# Patient Record
Sex: Female | Born: 1940 | Race: White | Hispanic: No | Marital: Married | Smoking: Never smoker
Health system: Southern US, Community
[De-identification: ages and names within clinical notes are randomized; demographics above are authoritative.]

## PROBLEM LIST (undated history)

## (undated) DIAGNOSIS — F411 Generalized anxiety disorder: Secondary | ICD-10-CM

## (undated) DIAGNOSIS — E039 Hypothyroidism, unspecified: Secondary | ICD-10-CM

## (undated) DIAGNOSIS — E559 Vitamin D deficiency, unspecified: Secondary | ICD-10-CM

## (undated) DIAGNOSIS — F419 Anxiety disorder, unspecified: Secondary | ICD-10-CM

## (undated) DIAGNOSIS — I129 Hypertensive chronic kidney disease with stage 1 through stage 4 chronic kidney disease, or unspecified chronic kidney disease: Secondary | ICD-10-CM

## (undated) DIAGNOSIS — I471 Supraventricular tachycardia, unspecified: Secondary | ICD-10-CM

## (undated) DIAGNOSIS — M51369 Other intervertebral disc degeneration, lumbar region without mention of lumbar back pain or lower extremity pain: Secondary | ICD-10-CM

## (undated) DIAGNOSIS — Z8679 Personal history of other diseases of the circulatory system: Secondary | ICD-10-CM

## (undated) DIAGNOSIS — Z9889 Other specified postprocedural states: Secondary | ICD-10-CM

## (undated) DIAGNOSIS — F329 Major depressive disorder, single episode, unspecified: Secondary | ICD-10-CM

## (undated) DIAGNOSIS — M5136 Other intervertebral disc degeneration, lumbar region: Secondary | ICD-10-CM

## (undated) DIAGNOSIS — F32A Depression, unspecified: Secondary | ICD-10-CM

## (undated) DIAGNOSIS — R5383 Other fatigue: Secondary | ICD-10-CM

## (undated) DIAGNOSIS — R0602 Shortness of breath: Secondary | ICD-10-CM

## (undated) HISTORY — DX: Generalized anxiety disorder: F41.1

## (undated) HISTORY — DX: Hypertensive chronic kidney disease with stage 1 through stage 4 chronic kidney disease, or unspecified chronic kidney disease: I12.9

## (undated) HISTORY — DX: Supraventricular tachycardia, unspecified: I47.10

## (undated) HISTORY — DX: Personal history of other diseases of the circulatory system: Z86.79

## (undated) HISTORY — DX: Anxiety disorder, unspecified: F41.9

## (undated) HISTORY — DX: Vitamin D deficiency, unspecified: E55.9

## (undated) HISTORY — DX: Depression, unspecified: F32.A

## (undated) HISTORY — DX: Other intervertebral disc degeneration, lumbar region without mention of lumbar back pain or lower extremity pain: M51.369

## (undated) HISTORY — DX: Other specified postprocedural states: Z98.890

## (undated) HISTORY — DX: Other fatigue: R53.83

## (undated) HISTORY — DX: Hypothyroidism, unspecified: E03.9

## (undated) HISTORY — DX: Shortness of breath: R06.02

## (undated) HISTORY — DX: Other intervertebral disc degeneration, lumbar region: M51.36

---
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
## 1972-07-16 HISTORY — PX: ABDOMINAL HYSTERECTOMY: SHX81

## 2015-07-19 DIAGNOSIS — H524 Presbyopia: Secondary | ICD-10-CM | POA: Insufficient documentation

## 2015-07-19 DIAGNOSIS — F339 Major depressive disorder, recurrent, unspecified: Secondary | ICD-10-CM | POA: Insufficient documentation

## 2015-07-19 DIAGNOSIS — L719 Rosacea, unspecified: Secondary | ICD-10-CM | POA: Insufficient documentation

## 2015-07-19 DIAGNOSIS — E039 Hypothyroidism, unspecified: Secondary | ICD-10-CM | POA: Insufficient documentation

## 2015-07-19 DIAGNOSIS — F5105 Insomnia due to other mental disorder: Secondary | ICD-10-CM | POA: Insufficient documentation

## 2015-07-19 DIAGNOSIS — H2513 Age-related nuclear cataract, bilateral: Secondary | ICD-10-CM | POA: Insufficient documentation

## 2015-07-19 DIAGNOSIS — F418 Other specified anxiety disorders: Secondary | ICD-10-CM | POA: Insufficient documentation

## 2015-10-11 DIAGNOSIS — F439 Reaction to severe stress, unspecified: Secondary | ICD-10-CM | POA: Insufficient documentation

## 2015-10-11 DIAGNOSIS — F339 Major depressive disorder, recurrent, unspecified: Secondary | ICD-10-CM | POA: Insufficient documentation

## 2016-07-16 HISTORY — PX: MITRAL VALVE REPAIR: SHX2039

## 2016-08-09 DIAGNOSIS — H2513 Age-related nuclear cataract, bilateral: Secondary | ICD-10-CM | POA: Diagnosis not present

## 2016-08-09 DIAGNOSIS — H40013 Open angle with borderline findings, low risk, bilateral: Secondary | ICD-10-CM | POA: Diagnosis not present

## 2016-08-28 DIAGNOSIS — R1012 Left upper quadrant pain: Secondary | ICD-10-CM | POA: Diagnosis not present

## 2016-09-12 DIAGNOSIS — R69 Illness, unspecified: Secondary | ICD-10-CM | POA: Diagnosis not present

## 2016-09-18 DIAGNOSIS — E039 Hypothyroidism, unspecified: Secondary | ICD-10-CM | POA: Diagnosis not present

## 2016-09-18 DIAGNOSIS — I10 Essential (primary) hypertension: Secondary | ICD-10-CM | POA: Diagnosis not present

## 2016-09-20 DIAGNOSIS — R1012 Left upper quadrant pain: Secondary | ICD-10-CM | POA: Diagnosis not present

## 2016-09-20 DIAGNOSIS — R109 Unspecified abdominal pain: Secondary | ICD-10-CM | POA: Diagnosis not present

## 2016-09-20 DIAGNOSIS — R1084 Generalized abdominal pain: Secondary | ICD-10-CM | POA: Diagnosis not present

## 2016-10-04 DIAGNOSIS — Z1231 Encounter for screening mammogram for malignant neoplasm of breast: Secondary | ICD-10-CM | POA: Diagnosis not present

## 2016-10-23 DIAGNOSIS — K267 Chronic duodenal ulcer without hemorrhage or perforation: Secondary | ICD-10-CM | POA: Diagnosis not present

## 2016-10-23 DIAGNOSIS — K269 Duodenal ulcer, unspecified as acute or chronic, without hemorrhage or perforation: Secondary | ICD-10-CM | POA: Diagnosis not present

## 2016-10-23 DIAGNOSIS — K2970 Gastritis, unspecified, without bleeding: Secondary | ICD-10-CM | POA: Diagnosis not present

## 2016-10-23 DIAGNOSIS — K2950 Unspecified chronic gastritis without bleeding: Secondary | ICD-10-CM | POA: Diagnosis not present

## 2016-10-23 DIAGNOSIS — R1012 Left upper quadrant pain: Secondary | ICD-10-CM | POA: Diagnosis not present

## 2016-10-25 DIAGNOSIS — Z0000 Encounter for general adult medical examination without abnormal findings: Secondary | ICD-10-CM | POA: Diagnosis not present

## 2016-10-25 DIAGNOSIS — G43801 Other migraine, not intractable, with status migrainosus: Secondary | ICD-10-CM | POA: Diagnosis not present

## 2016-10-25 DIAGNOSIS — K219 Gastro-esophageal reflux disease without esophagitis: Secondary | ICD-10-CM | POA: Diagnosis not present

## 2016-10-25 DIAGNOSIS — G43109 Migraine with aura, not intractable, without status migrainosus: Secondary | ICD-10-CM | POA: Insufficient documentation

## 2016-10-25 DIAGNOSIS — G4700 Insomnia, unspecified: Secondary | ICD-10-CM | POA: Diagnosis not present

## 2016-10-25 DIAGNOSIS — R319 Hematuria, unspecified: Secondary | ICD-10-CM | POA: Diagnosis not present

## 2016-10-25 DIAGNOSIS — R7301 Impaired fasting glucose: Secondary | ICD-10-CM | POA: Diagnosis not present

## 2016-10-25 DIAGNOSIS — I10 Essential (primary) hypertension: Secondary | ICD-10-CM | POA: Diagnosis not present

## 2016-10-25 DIAGNOSIS — R69 Illness, unspecified: Secondary | ICD-10-CM | POA: Diagnosis not present

## 2016-10-25 DIAGNOSIS — K269 Duodenal ulcer, unspecified as acute or chronic, without hemorrhage or perforation: Secondary | ICD-10-CM | POA: Diagnosis not present

## 2016-10-25 DIAGNOSIS — E039 Hypothyroidism, unspecified: Secondary | ICD-10-CM | POA: Diagnosis not present

## 2016-11-07 DIAGNOSIS — R3121 Asymptomatic microscopic hematuria: Secondary | ICD-10-CM | POA: Insufficient documentation

## 2016-11-07 DIAGNOSIS — R319 Hematuria, unspecified: Secondary | ICD-10-CM | POA: Diagnosis not present

## 2016-12-14 HISTORY — PX: TRANSTHORACIC ECHOCARDIOGRAM: SHX275

## 2016-12-20 DIAGNOSIS — E039 Hypothyroidism, unspecified: Secondary | ICD-10-CM | POA: Diagnosis not present

## 2016-12-25 DIAGNOSIS — G43801 Other migraine, not intractable, with status migrainosus: Secondary | ICD-10-CM | POA: Diagnosis not present

## 2016-12-25 DIAGNOSIS — E039 Hypothyroidism, unspecified: Secondary | ICD-10-CM | POA: Diagnosis not present

## 2016-12-25 DIAGNOSIS — H9201 Otalgia, right ear: Secondary | ICD-10-CM | POA: Diagnosis not present

## 2016-12-25 DIAGNOSIS — R011 Cardiac murmur, unspecified: Secondary | ICD-10-CM | POA: Diagnosis not present

## 2017-01-04 DIAGNOSIS — R011 Cardiac murmur, unspecified: Secondary | ICD-10-CM | POA: Diagnosis not present

## 2017-01-11 DIAGNOSIS — M546 Pain in thoracic spine: Secondary | ICD-10-CM | POA: Diagnosis not present

## 2017-01-11 DIAGNOSIS — M9901 Segmental and somatic dysfunction of cervical region: Secondary | ICD-10-CM | POA: Diagnosis not present

## 2017-01-11 DIAGNOSIS — G44209 Tension-type headache, unspecified, not intractable: Secondary | ICD-10-CM | POA: Diagnosis not present

## 2017-01-11 DIAGNOSIS — M542 Cervicalgia: Secondary | ICD-10-CM | POA: Diagnosis not present

## 2017-01-14 DIAGNOSIS — M546 Pain in thoracic spine: Secondary | ICD-10-CM | POA: Diagnosis not present

## 2017-01-14 DIAGNOSIS — M9901 Segmental and somatic dysfunction of cervical region: Secondary | ICD-10-CM | POA: Diagnosis not present

## 2017-01-14 DIAGNOSIS — M542 Cervicalgia: Secondary | ICD-10-CM | POA: Diagnosis not present

## 2017-01-14 DIAGNOSIS — G44209 Tension-type headache, unspecified, not intractable: Secondary | ICD-10-CM | POA: Diagnosis not present

## 2017-01-22 DIAGNOSIS — G44209 Tension-type headache, unspecified, not intractable: Secondary | ICD-10-CM | POA: Diagnosis not present

## 2017-01-22 DIAGNOSIS — M546 Pain in thoracic spine: Secondary | ICD-10-CM | POA: Diagnosis not present

## 2017-01-22 DIAGNOSIS — M9901 Segmental and somatic dysfunction of cervical region: Secondary | ICD-10-CM | POA: Diagnosis not present

## 2017-01-22 DIAGNOSIS — M542 Cervicalgia: Secondary | ICD-10-CM | POA: Diagnosis not present

## 2017-01-28 DIAGNOSIS — G44209 Tension-type headache, unspecified, not intractable: Secondary | ICD-10-CM | POA: Diagnosis not present

## 2017-01-28 DIAGNOSIS — M542 Cervicalgia: Secondary | ICD-10-CM | POA: Diagnosis not present

## 2017-01-28 DIAGNOSIS — M9901 Segmental and somatic dysfunction of cervical region: Secondary | ICD-10-CM | POA: Diagnosis not present

## 2017-01-28 DIAGNOSIS — M546 Pain in thoracic spine: Secondary | ICD-10-CM | POA: Diagnosis not present

## 2017-01-30 DIAGNOSIS — Z79899 Other long term (current) drug therapy: Secondary | ICD-10-CM | POA: Diagnosis not present

## 2017-01-30 DIAGNOSIS — I340 Nonrheumatic mitral (valve) insufficiency: Secondary | ICD-10-CM | POA: Diagnosis not present

## 2017-01-30 DIAGNOSIS — G43819 Other migraine, intractable, without status migrainosus: Secondary | ICD-10-CM | POA: Diagnosis not present

## 2017-01-30 DIAGNOSIS — E039 Hypothyroidism, unspecified: Secondary | ICD-10-CM | POA: Diagnosis not present

## 2017-01-30 DIAGNOSIS — R030 Elevated blood-pressure reading, without diagnosis of hypertension: Secondary | ICD-10-CM | POA: Diagnosis not present

## 2017-01-30 DIAGNOSIS — R5383 Other fatigue: Secondary | ICD-10-CM | POA: Diagnosis not present

## 2017-01-31 DIAGNOSIS — R0602 Shortness of breath: Secondary | ICD-10-CM | POA: Diagnosis not present

## 2017-01-31 DIAGNOSIS — M9901 Segmental and somatic dysfunction of cervical region: Secondary | ICD-10-CM | POA: Diagnosis not present

## 2017-01-31 DIAGNOSIS — G44209 Tension-type headache, unspecified, not intractable: Secondary | ICD-10-CM | POA: Diagnosis not present

## 2017-01-31 DIAGNOSIS — M542 Cervicalgia: Secondary | ICD-10-CM | POA: Diagnosis not present

## 2017-01-31 DIAGNOSIS — R5383 Other fatigue: Secondary | ICD-10-CM | POA: Diagnosis not present

## 2017-01-31 DIAGNOSIS — R001 Bradycardia, unspecified: Secondary | ICD-10-CM | POA: Diagnosis not present

## 2017-01-31 DIAGNOSIS — M546 Pain in thoracic spine: Secondary | ICD-10-CM | POA: Diagnosis not present

## 2017-02-04 DIAGNOSIS — M9901 Segmental and somatic dysfunction of cervical region: Secondary | ICD-10-CM | POA: Diagnosis not present

## 2017-02-04 DIAGNOSIS — G44209 Tension-type headache, unspecified, not intractable: Secondary | ICD-10-CM | POA: Diagnosis not present

## 2017-02-04 DIAGNOSIS — M546 Pain in thoracic spine: Secondary | ICD-10-CM | POA: Diagnosis not present

## 2017-02-04 DIAGNOSIS — M542 Cervicalgia: Secondary | ICD-10-CM | POA: Diagnosis not present

## 2017-02-07 DIAGNOSIS — H35431 Paving stone degeneration of retina, right eye: Secondary | ICD-10-CM | POA: Diagnosis not present

## 2017-02-07 DIAGNOSIS — H40013 Open angle with borderline findings, low risk, bilateral: Secondary | ICD-10-CM | POA: Diagnosis not present

## 2017-02-07 DIAGNOSIS — H2513 Age-related nuclear cataract, bilateral: Secondary | ICD-10-CM | POA: Diagnosis not present

## 2017-02-07 DIAGNOSIS — H43813 Vitreous degeneration, bilateral: Secondary | ICD-10-CM | POA: Diagnosis not present

## 2017-02-07 DIAGNOSIS — H5203 Hypermetropia, bilateral: Secondary | ICD-10-CM | POA: Diagnosis not present

## 2017-02-07 DIAGNOSIS — M546 Pain in thoracic spine: Secondary | ICD-10-CM | POA: Diagnosis not present

## 2017-02-07 DIAGNOSIS — M9901 Segmental and somatic dysfunction of cervical region: Secondary | ICD-10-CM | POA: Diagnosis not present

## 2017-02-07 DIAGNOSIS — H40003 Preglaucoma, unspecified, bilateral: Secondary | ICD-10-CM | POA: Diagnosis not present

## 2017-02-07 DIAGNOSIS — H524 Presbyopia: Secondary | ICD-10-CM | POA: Diagnosis not present

## 2017-02-07 DIAGNOSIS — M542 Cervicalgia: Secondary | ICD-10-CM | POA: Diagnosis not present

## 2017-02-07 DIAGNOSIS — G44209 Tension-type headache, unspecified, not intractable: Secondary | ICD-10-CM | POA: Diagnosis not present

## 2017-02-11 DIAGNOSIS — M9901 Segmental and somatic dysfunction of cervical region: Secondary | ICD-10-CM | POA: Diagnosis not present

## 2017-02-11 DIAGNOSIS — M546 Pain in thoracic spine: Secondary | ICD-10-CM | POA: Diagnosis not present

## 2017-02-11 DIAGNOSIS — M542 Cervicalgia: Secondary | ICD-10-CM | POA: Diagnosis not present

## 2017-02-11 DIAGNOSIS — G44209 Tension-type headache, unspecified, not intractable: Secondary | ICD-10-CM | POA: Diagnosis not present

## 2017-02-13 HISTORY — PX: TEE WITHOUT CARDIOVERSION: SHX5443

## 2017-02-18 DIAGNOSIS — M546 Pain in thoracic spine: Secondary | ICD-10-CM | POA: Diagnosis not present

## 2017-02-18 DIAGNOSIS — G44209 Tension-type headache, unspecified, not intractable: Secondary | ICD-10-CM | POA: Diagnosis not present

## 2017-02-18 DIAGNOSIS — M9901 Segmental and somatic dysfunction of cervical region: Secondary | ICD-10-CM | POA: Diagnosis not present

## 2017-02-18 DIAGNOSIS — M542 Cervicalgia: Secondary | ICD-10-CM | POA: Diagnosis not present

## 2017-02-21 DIAGNOSIS — G44209 Tension-type headache, unspecified, not intractable: Secondary | ICD-10-CM | POA: Diagnosis not present

## 2017-02-21 DIAGNOSIS — M542 Cervicalgia: Secondary | ICD-10-CM | POA: Diagnosis not present

## 2017-02-21 DIAGNOSIS — M546 Pain in thoracic spine: Secondary | ICD-10-CM | POA: Diagnosis not present

## 2017-02-21 DIAGNOSIS — M9901 Segmental and somatic dysfunction of cervical region: Secondary | ICD-10-CM | POA: Diagnosis not present

## 2017-02-22 DIAGNOSIS — I351 Nonrheumatic aortic (valve) insufficiency: Secondary | ICD-10-CM | POA: Diagnosis not present

## 2017-02-22 DIAGNOSIS — I340 Nonrheumatic mitral (valve) insufficiency: Secondary | ICD-10-CM | POA: Diagnosis not present

## 2017-02-22 DIAGNOSIS — R931 Abnormal findings on diagnostic imaging of heart and coronary circulation: Secondary | ICD-10-CM | POA: Diagnosis not present

## 2017-02-22 DIAGNOSIS — I361 Nonrheumatic tricuspid (valve) insufficiency: Secondary | ICD-10-CM | POA: Diagnosis not present

## 2017-02-22 DIAGNOSIS — I371 Nonrheumatic pulmonary valve insufficiency: Secondary | ICD-10-CM | POA: Diagnosis not present

## 2017-02-22 DIAGNOSIS — I517 Cardiomegaly: Secondary | ICD-10-CM | POA: Diagnosis not present

## 2017-02-22 DIAGNOSIS — Q211 Atrial septal defect: Secondary | ICD-10-CM | POA: Diagnosis not present

## 2017-02-22 DIAGNOSIS — I700 Atherosclerosis of aorta: Secondary | ICD-10-CM | POA: Diagnosis not present

## 2017-02-28 DIAGNOSIS — G44209 Tension-type headache, unspecified, not intractable: Secondary | ICD-10-CM | POA: Diagnosis not present

## 2017-02-28 DIAGNOSIS — M546 Pain in thoracic spine: Secondary | ICD-10-CM | POA: Diagnosis not present

## 2017-02-28 DIAGNOSIS — M542 Cervicalgia: Secondary | ICD-10-CM | POA: Diagnosis not present

## 2017-02-28 DIAGNOSIS — M9901 Segmental and somatic dysfunction of cervical region: Secondary | ICD-10-CM | POA: Diagnosis not present

## 2017-03-06 DIAGNOSIS — M546 Pain in thoracic spine: Secondary | ICD-10-CM | POA: Diagnosis not present

## 2017-03-06 DIAGNOSIS — M9901 Segmental and somatic dysfunction of cervical region: Secondary | ICD-10-CM | POA: Diagnosis not present

## 2017-03-06 DIAGNOSIS — G44209 Tension-type headache, unspecified, not intractable: Secondary | ICD-10-CM | POA: Diagnosis not present

## 2017-03-06 DIAGNOSIS — M542 Cervicalgia: Secondary | ICD-10-CM | POA: Diagnosis not present

## 2017-03-13 DIAGNOSIS — M9901 Segmental and somatic dysfunction of cervical region: Secondary | ICD-10-CM | POA: Diagnosis not present

## 2017-03-13 DIAGNOSIS — G44209 Tension-type headache, unspecified, not intractable: Secondary | ICD-10-CM | POA: Diagnosis not present

## 2017-03-13 DIAGNOSIS — M546 Pain in thoracic spine: Secondary | ICD-10-CM | POA: Diagnosis not present

## 2017-03-13 DIAGNOSIS — M542 Cervicalgia: Secondary | ICD-10-CM | POA: Diagnosis not present

## 2017-03-20 DIAGNOSIS — M546 Pain in thoracic spine: Secondary | ICD-10-CM | POA: Diagnosis not present

## 2017-03-20 DIAGNOSIS — G44209 Tension-type headache, unspecified, not intractable: Secondary | ICD-10-CM | POA: Diagnosis not present

## 2017-03-20 DIAGNOSIS — M542 Cervicalgia: Secondary | ICD-10-CM | POA: Diagnosis not present

## 2017-03-20 DIAGNOSIS — M9901 Segmental and somatic dysfunction of cervical region: Secondary | ICD-10-CM | POA: Diagnosis not present

## 2017-03-27 DIAGNOSIS — I340 Nonrheumatic mitral (valve) insufficiency: Secondary | ICD-10-CM | POA: Diagnosis not present

## 2017-04-04 DIAGNOSIS — G4700 Insomnia, unspecified: Secondary | ICD-10-CM | POA: Diagnosis not present

## 2017-04-04 DIAGNOSIS — R69 Illness, unspecified: Secondary | ICD-10-CM | POA: Diagnosis not present

## 2017-04-11 DIAGNOSIS — M9901 Segmental and somatic dysfunction of cervical region: Secondary | ICD-10-CM | POA: Diagnosis not present

## 2017-04-11 DIAGNOSIS — M546 Pain in thoracic spine: Secondary | ICD-10-CM | POA: Diagnosis not present

## 2017-04-11 DIAGNOSIS — M542 Cervicalgia: Secondary | ICD-10-CM | POA: Diagnosis not present

## 2017-04-11 DIAGNOSIS — G44209 Tension-type headache, unspecified, not intractable: Secondary | ICD-10-CM | POA: Diagnosis not present

## 2017-04-18 DIAGNOSIS — I499 Cardiac arrhythmia, unspecified: Secondary | ICD-10-CM | POA: Diagnosis not present

## 2017-04-18 DIAGNOSIS — I509 Heart failure, unspecified: Secondary | ICD-10-CM | POA: Diagnosis not present

## 2017-04-18 DIAGNOSIS — R001 Bradycardia, unspecified: Secondary | ICD-10-CM | POA: Diagnosis not present

## 2017-04-18 DIAGNOSIS — R21 Rash and other nonspecific skin eruption: Secondary | ICD-10-CM | POA: Diagnosis not present

## 2017-04-18 DIAGNOSIS — Z79899 Other long term (current) drug therapy: Secondary | ICD-10-CM | POA: Diagnosis not present

## 2017-04-18 DIAGNOSIS — I2510 Atherosclerotic heart disease of native coronary artery without angina pectoris: Secondary | ICD-10-CM | POA: Diagnosis not present

## 2017-04-18 DIAGNOSIS — I340 Nonrheumatic mitral (valve) insufficiency: Secondary | ICD-10-CM | POA: Diagnosis not present

## 2017-04-18 HISTORY — PX: LEFT HEART CATH AND CORONARY ANGIOGRAPHY: CATH118249

## 2017-04-19 DIAGNOSIS — E039 Hypothyroidism, unspecified: Secondary | ICD-10-CM | POA: Diagnosis not present

## 2017-04-19 DIAGNOSIS — R3121 Asymptomatic microscopic hematuria: Secondary | ICD-10-CM | POA: Diagnosis not present

## 2017-04-19 DIAGNOSIS — R001 Bradycardia, unspecified: Secondary | ICD-10-CM | POA: Diagnosis not present

## 2017-04-19 DIAGNOSIS — I38 Endocarditis, valve unspecified: Secondary | ICD-10-CM | POA: Diagnosis not present

## 2017-04-19 DIAGNOSIS — R0609 Other forms of dyspnea: Secondary | ICD-10-CM | POA: Diagnosis not present

## 2017-04-19 DIAGNOSIS — I340 Nonrheumatic mitral (valve) insufficiency: Secondary | ICD-10-CM | POA: Diagnosis not present

## 2017-04-19 DIAGNOSIS — I10 Essential (primary) hypertension: Secondary | ICD-10-CM | POA: Diagnosis not present

## 2017-04-25 DIAGNOSIS — E039 Hypothyroidism, unspecified: Secondary | ICD-10-CM | POA: Diagnosis not present

## 2017-04-30 DIAGNOSIS — G43801 Other migraine, not intractable, with status migrainosus: Secondary | ICD-10-CM | POA: Diagnosis not present

## 2017-04-30 DIAGNOSIS — R001 Bradycardia, unspecified: Secondary | ICD-10-CM | POA: Insufficient documentation

## 2017-04-30 DIAGNOSIS — R7301 Impaired fasting glucose: Secondary | ICD-10-CM | POA: Diagnosis not present

## 2017-04-30 DIAGNOSIS — R69 Illness, unspecified: Secondary | ICD-10-CM | POA: Diagnosis not present

## 2017-04-30 DIAGNOSIS — I340 Nonrheumatic mitral (valve) insufficiency: Secondary | ICD-10-CM | POA: Diagnosis not present

## 2017-04-30 DIAGNOSIS — E039 Hypothyroidism, unspecified: Secondary | ICD-10-CM | POA: Diagnosis not present

## 2017-04-30 DIAGNOSIS — G4700 Insomnia, unspecified: Secondary | ICD-10-CM | POA: Diagnosis not present

## 2017-05-13 DIAGNOSIS — R69 Illness, unspecified: Secondary | ICD-10-CM | POA: Diagnosis not present

## 2017-05-13 DIAGNOSIS — G43901 Migraine, unspecified, not intractable, with status migrainosus: Secondary | ICD-10-CM | POA: Diagnosis not present

## 2017-05-13 DIAGNOSIS — G4700 Insomnia, unspecified: Secondary | ICD-10-CM | POA: Diagnosis not present

## 2017-05-13 DIAGNOSIS — I10 Essential (primary) hypertension: Secondary | ICD-10-CM | POA: Diagnosis not present

## 2017-05-13 DIAGNOSIS — K219 Gastro-esophageal reflux disease without esophagitis: Secondary | ICD-10-CM | POA: Diagnosis not present

## 2017-05-13 DIAGNOSIS — R001 Bradycardia, unspecified: Secondary | ICD-10-CM | POA: Diagnosis not present

## 2017-05-13 DIAGNOSIS — E039 Hypothyroidism, unspecified: Secondary | ICD-10-CM | POA: Diagnosis not present

## 2017-05-13 DIAGNOSIS — I340 Nonrheumatic mitral (valve) insufficiency: Secondary | ICD-10-CM | POA: Diagnosis not present

## 2017-05-13 DIAGNOSIS — Q211 Atrial septal defect: Secondary | ICD-10-CM | POA: Diagnosis not present

## 2017-05-13 DIAGNOSIS — D62 Acute posthemorrhagic anemia: Secondary | ICD-10-CM | POA: Diagnosis not present

## 2017-05-16 DIAGNOSIS — Z9889 Other specified postprocedural states: Secondary | ICD-10-CM

## 2017-05-16 HISTORY — DX: Other specified postprocedural states: Z98.890

## 2017-05-27 DIAGNOSIS — Z8679 Personal history of other diseases of the circulatory system: Secondary | ICD-10-CM | POA: Diagnosis not present

## 2017-05-27 DIAGNOSIS — E039 Hypothyroidism, unspecified: Secondary | ICD-10-CM | POA: Diagnosis not present

## 2017-05-27 DIAGNOSIS — I340 Nonrheumatic mitral (valve) insufficiency: Secondary | ICD-10-CM | POA: Diagnosis not present

## 2017-05-27 DIAGNOSIS — G8918 Other acute postprocedural pain: Secondary | ICD-10-CM | POA: Diagnosis not present

## 2017-05-27 DIAGNOSIS — J9811 Atelectasis: Secondary | ICD-10-CM | POA: Diagnosis not present

## 2017-05-27 DIAGNOSIS — D62 Acute posthemorrhagic anemia: Secondary | ICD-10-CM | POA: Diagnosis not present

## 2017-05-27 DIAGNOSIS — G43901 Migraine, unspecified, not intractable, with status migrainosus: Secondary | ICD-10-CM | POA: Diagnosis not present

## 2017-05-27 DIAGNOSIS — G4700 Insomnia, unspecified: Secondary | ICD-10-CM | POA: Diagnosis not present

## 2017-05-27 DIAGNOSIS — J9589 Other postprocedural complications and disorders of respiratory system, not elsewhere classified: Secondary | ICD-10-CM | POA: Diagnosis not present

## 2017-05-27 DIAGNOSIS — R69 Illness, unspecified: Secondary | ICD-10-CM | POA: Diagnosis not present

## 2017-05-27 DIAGNOSIS — Z954 Presence of other heart-valve replacement: Secondary | ICD-10-CM | POA: Diagnosis not present

## 2017-05-27 DIAGNOSIS — R001 Bradycardia, unspecified: Secondary | ICD-10-CM | POA: Diagnosis not present

## 2017-05-27 DIAGNOSIS — J95821 Acute postprocedural respiratory failure: Secondary | ICD-10-CM | POA: Diagnosis not present

## 2017-05-27 DIAGNOSIS — R739 Hyperglycemia, unspecified: Secondary | ICD-10-CM | POA: Diagnosis not present

## 2017-05-27 DIAGNOSIS — K219 Gastro-esophageal reflux disease without esophagitis: Secondary | ICD-10-CM | POA: Diagnosis not present

## 2017-05-27 DIAGNOSIS — Q211 Atrial septal defect: Secondary | ICD-10-CM | POA: Diagnosis not present

## 2017-05-27 DIAGNOSIS — I10 Essential (primary) hypertension: Secondary | ICD-10-CM | POA: Diagnosis not present

## 2017-05-27 DIAGNOSIS — J90 Pleural effusion, not elsewhere classified: Secondary | ICD-10-CM | POA: Diagnosis not present

## 2017-06-10 DIAGNOSIS — Z09 Encounter for follow-up examination after completed treatment for conditions other than malignant neoplasm: Secondary | ICD-10-CM | POA: Diagnosis not present

## 2017-06-10 DIAGNOSIS — Z9889 Other specified postprocedural states: Secondary | ICD-10-CM | POA: Diagnosis not present

## 2017-06-10 DIAGNOSIS — I340 Nonrheumatic mitral (valve) insufficiency: Secondary | ICD-10-CM | POA: Diagnosis not present

## 2017-06-27 DIAGNOSIS — Z8774 Personal history of (corrected) congenital malformations of heart and circulatory system: Secondary | ICD-10-CM | POA: Diagnosis not present

## 2017-06-27 DIAGNOSIS — Z48812 Encounter for surgical aftercare following surgery on the circulatory system: Secondary | ICD-10-CM | POA: Diagnosis not present

## 2017-07-23 DIAGNOSIS — Z952 Presence of prosthetic heart valve: Secondary | ICD-10-CM | POA: Diagnosis not present

## 2017-07-25 DIAGNOSIS — H2513 Age-related nuclear cataract, bilateral: Secondary | ICD-10-CM | POA: Diagnosis not present

## 2017-07-25 DIAGNOSIS — H40013 Open angle with borderline findings, low risk, bilateral: Secondary | ICD-10-CM | POA: Diagnosis not present

## 2017-07-29 DIAGNOSIS — Z952 Presence of prosthetic heart valve: Secondary | ICD-10-CM | POA: Diagnosis not present

## 2017-07-31 DIAGNOSIS — Z952 Presence of prosthetic heart valve: Secondary | ICD-10-CM | POA: Diagnosis not present

## 2017-08-01 DIAGNOSIS — Z952 Presence of prosthetic heart valve: Secondary | ICD-10-CM | POA: Diagnosis not present

## 2017-08-07 DIAGNOSIS — Z952 Presence of prosthetic heart valve: Secondary | ICD-10-CM | POA: Diagnosis not present

## 2017-08-08 DIAGNOSIS — Z952 Presence of prosthetic heart valve: Secondary | ICD-10-CM | POA: Diagnosis not present

## 2017-08-12 DIAGNOSIS — Z952 Presence of prosthetic heart valve: Secondary | ICD-10-CM | POA: Diagnosis not present

## 2017-08-14 DIAGNOSIS — I340 Nonrheumatic mitral (valve) insufficiency: Secondary | ICD-10-CM | POA: Diagnosis not present

## 2017-08-14 DIAGNOSIS — Z7982 Long term (current) use of aspirin: Secondary | ICD-10-CM | POA: Diagnosis not present

## 2017-08-14 DIAGNOSIS — Z952 Presence of prosthetic heart valve: Secondary | ICD-10-CM | POA: Diagnosis not present

## 2017-08-14 DIAGNOSIS — Z7901 Long term (current) use of anticoagulants: Secondary | ICD-10-CM | POA: Diagnosis not present

## 2017-08-15 DIAGNOSIS — Z952 Presence of prosthetic heart valve: Secondary | ICD-10-CM | POA: Diagnosis not present

## 2017-08-19 DIAGNOSIS — Z952 Presence of prosthetic heart valve: Secondary | ICD-10-CM | POA: Diagnosis not present

## 2017-08-21 DIAGNOSIS — Z952 Presence of prosthetic heart valve: Secondary | ICD-10-CM | POA: Diagnosis not present

## 2017-08-26 DIAGNOSIS — I071 Rheumatic tricuspid insufficiency: Secondary | ICD-10-CM | POA: Diagnosis not present

## 2017-08-26 DIAGNOSIS — I340 Nonrheumatic mitral (valve) insufficiency: Secondary | ICD-10-CM | POA: Diagnosis not present

## 2017-09-11 DIAGNOSIS — R69 Illness, unspecified: Secondary | ICD-10-CM | POA: Diagnosis not present

## 2017-09-24 DIAGNOSIS — Z0100 Encounter for examination of eyes and vision without abnormal findings: Secondary | ICD-10-CM | POA: Diagnosis not present

## 2017-10-15 DIAGNOSIS — Z1231 Encounter for screening mammogram for malignant neoplasm of breast: Secondary | ICD-10-CM | POA: Diagnosis not present

## 2017-11-01 DIAGNOSIS — R5383 Other fatigue: Secondary | ICD-10-CM | POA: Diagnosis not present

## 2017-11-01 DIAGNOSIS — Z0000 Encounter for general adult medical examination without abnormal findings: Secondary | ICD-10-CM | POA: Diagnosis not present

## 2017-11-01 DIAGNOSIS — E039 Hypothyroidism, unspecified: Secondary | ICD-10-CM | POA: Diagnosis not present

## 2017-11-01 DIAGNOSIS — I10 Essential (primary) hypertension: Secondary | ICD-10-CM | POA: Diagnosis not present

## 2017-11-04 DIAGNOSIS — K219 Gastro-esophageal reflux disease without esophagitis: Secondary | ICD-10-CM | POA: Diagnosis not present

## 2017-11-04 DIAGNOSIS — Z79899 Other long term (current) drug therapy: Secondary | ICD-10-CM | POA: Diagnosis not present

## 2017-11-04 DIAGNOSIS — Z0000 Encounter for general adult medical examination without abnormal findings: Secondary | ICD-10-CM | POA: Diagnosis not present

## 2017-11-04 DIAGNOSIS — E785 Hyperlipidemia, unspecified: Secondary | ICD-10-CM | POA: Insufficient documentation

## 2017-11-04 DIAGNOSIS — I10 Essential (primary) hypertension: Secondary | ICD-10-CM | POA: Diagnosis not present

## 2017-11-04 DIAGNOSIS — I340 Nonrheumatic mitral (valve) insufficiency: Secondary | ICD-10-CM | POA: Diagnosis not present

## 2017-11-04 DIAGNOSIS — E039 Hypothyroidism, unspecified: Secondary | ICD-10-CM | POA: Diagnosis not present

## 2017-11-04 DIAGNOSIS — G43801 Other migraine, not intractable, with status migrainosus: Secondary | ICD-10-CM | POA: Diagnosis not present

## 2017-11-04 DIAGNOSIS — R69 Illness, unspecified: Secondary | ICD-10-CM | POA: Diagnosis not present

## 2017-11-04 DIAGNOSIS — L719 Rosacea, unspecified: Secondary | ICD-10-CM | POA: Diagnosis not present

## 2017-11-04 DIAGNOSIS — R7301 Impaired fasting glucose: Secondary | ICD-10-CM | POA: Diagnosis not present

## 2017-11-12 DIAGNOSIS — Z780 Asymptomatic menopausal state: Secondary | ICD-10-CM | POA: Diagnosis not present

## 2017-11-12 DIAGNOSIS — M810 Age-related osteoporosis without current pathological fracture: Secondary | ICD-10-CM | POA: Diagnosis not present

## 2017-11-12 DIAGNOSIS — M8589 Other specified disorders of bone density and structure, multiple sites: Secondary | ICD-10-CM | POA: Diagnosis not present

## 2017-11-23 DIAGNOSIS — M8580 Other specified disorders of bone density and structure, unspecified site: Secondary | ICD-10-CM | POA: Insufficient documentation

## 2017-11-23 DIAGNOSIS — M810 Age-related osteoporosis without current pathological fracture: Secondary | ICD-10-CM | POA: Insufficient documentation

## 2017-12-17 DIAGNOSIS — Z1212 Encounter for screening for malignant neoplasm of rectum: Secondary | ICD-10-CM | POA: Diagnosis not present

## 2018-01-27 DIAGNOSIS — H40013 Open angle with borderline findings, low risk, bilateral: Secondary | ICD-10-CM | POA: Diagnosis not present

## 2018-01-27 DIAGNOSIS — H2513 Age-related nuclear cataract, bilateral: Secondary | ICD-10-CM | POA: Diagnosis not present

## 2018-01-27 DIAGNOSIS — H43813 Vitreous degeneration, bilateral: Secondary | ICD-10-CM | POA: Diagnosis not present

## 2018-01-27 DIAGNOSIS — H35431 Paving stone degeneration of retina, right eye: Secondary | ICD-10-CM | POA: Diagnosis not present

## 2018-01-27 DIAGNOSIS — H5203 Hypermetropia, bilateral: Secondary | ICD-10-CM | POA: Diagnosis not present

## 2018-01-27 DIAGNOSIS — H524 Presbyopia: Secondary | ICD-10-CM | POA: Diagnosis not present

## 2018-01-27 DIAGNOSIS — H25011 Cortical age-related cataract, right eye: Secondary | ICD-10-CM | POA: Diagnosis not present

## 2018-01-27 DIAGNOSIS — H52203 Unspecified astigmatism, bilateral: Secondary | ICD-10-CM | POA: Diagnosis not present

## 2018-05-09 DIAGNOSIS — M9903 Segmental and somatic dysfunction of lumbar region: Secondary | ICD-10-CM | POA: Diagnosis not present

## 2018-05-09 DIAGNOSIS — M9901 Segmental and somatic dysfunction of cervical region: Secondary | ICD-10-CM | POA: Diagnosis not present

## 2018-05-09 DIAGNOSIS — S335XXA Sprain of ligaments of lumbar spine, initial encounter: Secondary | ICD-10-CM | POA: Diagnosis not present

## 2018-05-09 DIAGNOSIS — M9904 Segmental and somatic dysfunction of sacral region: Secondary | ICD-10-CM | POA: Diagnosis not present

## 2018-05-13 DIAGNOSIS — M9901 Segmental and somatic dysfunction of cervical region: Secondary | ICD-10-CM | POA: Diagnosis not present

## 2018-05-13 DIAGNOSIS — S335XXA Sprain of ligaments of lumbar spine, initial encounter: Secondary | ICD-10-CM | POA: Diagnosis not present

## 2018-05-13 DIAGNOSIS — M9904 Segmental and somatic dysfunction of sacral region: Secondary | ICD-10-CM | POA: Diagnosis not present

## 2018-05-13 DIAGNOSIS — M9903 Segmental and somatic dysfunction of lumbar region: Secondary | ICD-10-CM | POA: Diagnosis not present

## 2018-05-14 DIAGNOSIS — M9903 Segmental and somatic dysfunction of lumbar region: Secondary | ICD-10-CM | POA: Diagnosis not present

## 2018-05-14 DIAGNOSIS — M9904 Segmental and somatic dysfunction of sacral region: Secondary | ICD-10-CM | POA: Diagnosis not present

## 2018-05-14 DIAGNOSIS — M9901 Segmental and somatic dysfunction of cervical region: Secondary | ICD-10-CM | POA: Diagnosis not present

## 2018-05-14 DIAGNOSIS — S335XXA Sprain of ligaments of lumbar spine, initial encounter: Secondary | ICD-10-CM | POA: Diagnosis not present

## 2018-05-16 DIAGNOSIS — M9903 Segmental and somatic dysfunction of lumbar region: Secondary | ICD-10-CM | POA: Diagnosis not present

## 2018-05-16 DIAGNOSIS — M9901 Segmental and somatic dysfunction of cervical region: Secondary | ICD-10-CM | POA: Diagnosis not present

## 2018-05-16 DIAGNOSIS — M9904 Segmental and somatic dysfunction of sacral region: Secondary | ICD-10-CM | POA: Diagnosis not present

## 2018-05-16 DIAGNOSIS — S335XXA Sprain of ligaments of lumbar spine, initial encounter: Secondary | ICD-10-CM | POA: Diagnosis not present

## 2018-05-19 DIAGNOSIS — M9904 Segmental and somatic dysfunction of sacral region: Secondary | ICD-10-CM | POA: Diagnosis not present

## 2018-05-19 DIAGNOSIS — M9903 Segmental and somatic dysfunction of lumbar region: Secondary | ICD-10-CM | POA: Diagnosis not present

## 2018-05-19 DIAGNOSIS — M9901 Segmental and somatic dysfunction of cervical region: Secondary | ICD-10-CM | POA: Diagnosis not present

## 2018-05-19 DIAGNOSIS — S335XXA Sprain of ligaments of lumbar spine, initial encounter: Secondary | ICD-10-CM | POA: Diagnosis not present

## 2018-05-27 DIAGNOSIS — S335XXA Sprain of ligaments of lumbar spine, initial encounter: Secondary | ICD-10-CM | POA: Diagnosis not present

## 2018-05-27 DIAGNOSIS — M9901 Segmental and somatic dysfunction of cervical region: Secondary | ICD-10-CM | POA: Diagnosis not present

## 2018-05-27 DIAGNOSIS — M9904 Segmental and somatic dysfunction of sacral region: Secondary | ICD-10-CM | POA: Diagnosis not present

## 2018-05-27 DIAGNOSIS — M9903 Segmental and somatic dysfunction of lumbar region: Secondary | ICD-10-CM | POA: Diagnosis not present

## 2018-05-28 DIAGNOSIS — Z7982 Long term (current) use of aspirin: Secondary | ICD-10-CM | POA: Diagnosis not present

## 2018-05-28 DIAGNOSIS — I340 Nonrheumatic mitral (valve) insufficiency: Secondary | ICD-10-CM | POA: Diagnosis not present

## 2018-05-28 DIAGNOSIS — I10 Essential (primary) hypertension: Secondary | ICD-10-CM | POA: Diagnosis not present

## 2018-05-29 DIAGNOSIS — M9904 Segmental and somatic dysfunction of sacral region: Secondary | ICD-10-CM | POA: Diagnosis not present

## 2018-05-29 DIAGNOSIS — S335XXA Sprain of ligaments of lumbar spine, initial encounter: Secondary | ICD-10-CM | POA: Diagnosis not present

## 2018-05-29 DIAGNOSIS — M9901 Segmental and somatic dysfunction of cervical region: Secondary | ICD-10-CM | POA: Diagnosis not present

## 2018-05-29 DIAGNOSIS — M9903 Segmental and somatic dysfunction of lumbar region: Secondary | ICD-10-CM | POA: Diagnosis not present

## 2018-06-03 DIAGNOSIS — M9904 Segmental and somatic dysfunction of sacral region: Secondary | ICD-10-CM | POA: Diagnosis not present

## 2018-06-03 DIAGNOSIS — S335XXA Sprain of ligaments of lumbar spine, initial encounter: Secondary | ICD-10-CM | POA: Diagnosis not present

## 2018-06-03 DIAGNOSIS — M9903 Segmental and somatic dysfunction of lumbar region: Secondary | ICD-10-CM | POA: Diagnosis not present

## 2018-06-03 DIAGNOSIS — M9901 Segmental and somatic dysfunction of cervical region: Secondary | ICD-10-CM | POA: Diagnosis not present

## 2018-06-05 DIAGNOSIS — S335XXA Sprain of ligaments of lumbar spine, initial encounter: Secondary | ICD-10-CM | POA: Diagnosis not present

## 2018-06-05 DIAGNOSIS — M9904 Segmental and somatic dysfunction of sacral region: Secondary | ICD-10-CM | POA: Diagnosis not present

## 2018-06-05 DIAGNOSIS — M9901 Segmental and somatic dysfunction of cervical region: Secondary | ICD-10-CM | POA: Diagnosis not present

## 2018-06-05 DIAGNOSIS — M9903 Segmental and somatic dysfunction of lumbar region: Secondary | ICD-10-CM | POA: Diagnosis not present

## 2018-06-09 DIAGNOSIS — M9903 Segmental and somatic dysfunction of lumbar region: Secondary | ICD-10-CM | POA: Diagnosis not present

## 2018-06-09 DIAGNOSIS — M9904 Segmental and somatic dysfunction of sacral region: Secondary | ICD-10-CM | POA: Diagnosis not present

## 2018-06-09 DIAGNOSIS — S335XXA Sprain of ligaments of lumbar spine, initial encounter: Secondary | ICD-10-CM | POA: Diagnosis not present

## 2018-06-09 DIAGNOSIS — M9901 Segmental and somatic dysfunction of cervical region: Secondary | ICD-10-CM | POA: Diagnosis not present

## 2018-06-10 DIAGNOSIS — E785 Hyperlipidemia, unspecified: Secondary | ICD-10-CM | POA: Diagnosis not present

## 2018-06-10 DIAGNOSIS — E039 Hypothyroidism, unspecified: Secondary | ICD-10-CM | POA: Diagnosis not present

## 2018-06-10 DIAGNOSIS — N289 Disorder of kidney and ureter, unspecified: Secondary | ICD-10-CM | POA: Diagnosis not present

## 2018-06-11 DIAGNOSIS — S335XXA Sprain of ligaments of lumbar spine, initial encounter: Secondary | ICD-10-CM | POA: Diagnosis not present

## 2018-06-11 DIAGNOSIS — M9904 Segmental and somatic dysfunction of sacral region: Secondary | ICD-10-CM | POA: Diagnosis not present

## 2018-06-11 DIAGNOSIS — M9903 Segmental and somatic dysfunction of lumbar region: Secondary | ICD-10-CM | POA: Diagnosis not present

## 2018-06-11 DIAGNOSIS — M9901 Segmental and somatic dysfunction of cervical region: Secondary | ICD-10-CM | POA: Diagnosis not present

## 2018-06-16 DIAGNOSIS — M9904 Segmental and somatic dysfunction of sacral region: Secondary | ICD-10-CM | POA: Diagnosis not present

## 2018-06-16 DIAGNOSIS — M9901 Segmental and somatic dysfunction of cervical region: Secondary | ICD-10-CM | POA: Diagnosis not present

## 2018-06-16 DIAGNOSIS — S335XXA Sprain of ligaments of lumbar spine, initial encounter: Secondary | ICD-10-CM | POA: Diagnosis not present

## 2018-06-16 DIAGNOSIS — M9903 Segmental and somatic dysfunction of lumbar region: Secondary | ICD-10-CM | POA: Diagnosis not present

## 2018-06-18 DIAGNOSIS — M5416 Radiculopathy, lumbar region: Secondary | ICD-10-CM | POA: Diagnosis not present

## 2018-06-18 DIAGNOSIS — E039 Hypothyroidism, unspecified: Secondary | ICD-10-CM | POA: Diagnosis not present

## 2018-06-18 DIAGNOSIS — R7301 Impaired fasting glucose: Secondary | ICD-10-CM | POA: Diagnosis not present

## 2018-06-18 DIAGNOSIS — E785 Hyperlipidemia, unspecified: Secondary | ICD-10-CM | POA: Diagnosis not present

## 2018-06-19 DIAGNOSIS — M9903 Segmental and somatic dysfunction of lumbar region: Secondary | ICD-10-CM | POA: Diagnosis not present

## 2018-06-19 DIAGNOSIS — M9904 Segmental and somatic dysfunction of sacral region: Secondary | ICD-10-CM | POA: Diagnosis not present

## 2018-06-19 DIAGNOSIS — M9901 Segmental and somatic dysfunction of cervical region: Secondary | ICD-10-CM | POA: Diagnosis not present

## 2018-06-19 DIAGNOSIS — S335XXA Sprain of ligaments of lumbar spine, initial encounter: Secondary | ICD-10-CM | POA: Diagnosis not present

## 2018-07-28 DIAGNOSIS — M5136 Other intervertebral disc degeneration, lumbar region: Secondary | ICD-10-CM | POA: Diagnosis not present

## 2018-07-28 DIAGNOSIS — M5416 Radiculopathy, lumbar region: Secondary | ICD-10-CM | POA: Diagnosis not present

## 2018-07-28 DIAGNOSIS — S32040D Wedge compression fracture of fourth lumbar vertebra, subsequent encounter for fracture with routine healing: Secondary | ICD-10-CM | POA: Diagnosis not present

## 2018-07-28 DIAGNOSIS — M47816 Spondylosis without myelopathy or radiculopathy, lumbar region: Secondary | ICD-10-CM | POA: Diagnosis not present

## 2018-07-28 DIAGNOSIS — M48061 Spinal stenosis, lumbar region without neurogenic claudication: Secondary | ICD-10-CM | POA: Diagnosis not present

## 2018-08-01 DIAGNOSIS — M5126 Other intervertebral disc displacement, lumbar region: Secondary | ICD-10-CM | POA: Diagnosis not present

## 2018-08-01 DIAGNOSIS — M5416 Radiculopathy, lumbar region: Secondary | ICD-10-CM | POA: Diagnosis not present

## 2018-08-01 DIAGNOSIS — M48061 Spinal stenosis, lumbar region without neurogenic claudication: Secondary | ICD-10-CM | POA: Diagnosis not present

## 2018-08-25 DIAGNOSIS — M47816 Spondylosis without myelopathy or radiculopathy, lumbar region: Secondary | ICD-10-CM | POA: Diagnosis not present

## 2018-08-25 DIAGNOSIS — M5416 Radiculopathy, lumbar region: Secondary | ICD-10-CM | POA: Diagnosis not present

## 2018-08-25 DIAGNOSIS — M5136 Other intervertebral disc degeneration, lumbar region: Secondary | ICD-10-CM | POA: Diagnosis not present

## 2018-08-25 DIAGNOSIS — M79604 Pain in right leg: Secondary | ICD-10-CM | POA: Diagnosis not present

## 2018-08-27 DIAGNOSIS — H40013 Open angle with borderline findings, low risk, bilateral: Secondary | ICD-10-CM | POA: Diagnosis not present

## 2018-09-11 DIAGNOSIS — M4316 Spondylolisthesis, lumbar region: Secondary | ICD-10-CM | POA: Diagnosis not present

## 2018-09-11 DIAGNOSIS — M5136 Other intervertebral disc degeneration, lumbar region: Secondary | ICD-10-CM | POA: Diagnosis not present

## 2018-09-11 DIAGNOSIS — M5416 Radiculopathy, lumbar region: Secondary | ICD-10-CM | POA: Diagnosis not present

## 2018-09-11 DIAGNOSIS — M47896 Other spondylosis, lumbar region: Secondary | ICD-10-CM | POA: Diagnosis not present

## 2018-09-11 DIAGNOSIS — I10 Essential (primary) hypertension: Secondary | ICD-10-CM | POA: Diagnosis not present

## 2018-09-19 DIAGNOSIS — M5416 Radiculopathy, lumbar region: Secondary | ICD-10-CM | POA: Diagnosis not present

## 2018-09-22 DIAGNOSIS — M79604 Pain in right leg: Secondary | ICD-10-CM | POA: Diagnosis not present

## 2018-09-22 DIAGNOSIS — M5416 Radiculopathy, lumbar region: Secondary | ICD-10-CM | POA: Diagnosis not present

## 2018-12-17 DIAGNOSIS — E785 Hyperlipidemia, unspecified: Secondary | ICD-10-CM | POA: Diagnosis not present

## 2018-12-17 DIAGNOSIS — R7301 Impaired fasting glucose: Secondary | ICD-10-CM | POA: Diagnosis not present

## 2018-12-17 DIAGNOSIS — I10 Essential (primary) hypertension: Secondary | ICD-10-CM | POA: Diagnosis not present

## 2018-12-17 DIAGNOSIS — E039 Hypothyroidism, unspecified: Secondary | ICD-10-CM | POA: Diagnosis not present

## 2018-12-18 DIAGNOSIS — Z1231 Encounter for screening mammogram for malignant neoplasm of breast: Secondary | ICD-10-CM | POA: Diagnosis not present

## 2018-12-19 DIAGNOSIS — Z20828 Contact with and (suspected) exposure to other viral communicable diseases: Secondary | ICD-10-CM | POA: Diagnosis not present

## 2018-12-23 DIAGNOSIS — R7301 Impaired fasting glucose: Secondary | ICD-10-CM | POA: Diagnosis not present

## 2018-12-23 DIAGNOSIS — Z9889 Other specified postprocedural states: Secondary | ICD-10-CM | POA: Insufficient documentation

## 2018-12-23 DIAGNOSIS — H9193 Unspecified hearing loss, bilateral: Secondary | ICD-10-CM | POA: Diagnosis not present

## 2018-12-23 DIAGNOSIS — M8580 Other specified disorders of bone density and structure, unspecified site: Secondary | ICD-10-CM | POA: Diagnosis not present

## 2018-12-23 DIAGNOSIS — M5136 Other intervertebral disc degeneration, lumbar region: Secondary | ICD-10-CM | POA: Diagnosis not present

## 2018-12-23 DIAGNOSIS — E039 Hypothyroidism, unspecified: Secondary | ICD-10-CM | POA: Diagnosis not present

## 2018-12-23 DIAGNOSIS — Z954 Presence of other heart-valve replacement: Secondary | ICD-10-CM | POA: Insufficient documentation

## 2018-12-23 DIAGNOSIS — L719 Rosacea, unspecified: Secondary | ICD-10-CM | POA: Diagnosis not present

## 2018-12-23 DIAGNOSIS — G43801 Other migraine, not intractable, with status migrainosus: Secondary | ICD-10-CM | POA: Diagnosis not present

## 2018-12-23 DIAGNOSIS — K219 Gastro-esophageal reflux disease without esophagitis: Secondary | ICD-10-CM | POA: Diagnosis not present

## 2018-12-23 DIAGNOSIS — G4700 Insomnia, unspecified: Secondary | ICD-10-CM | POA: Diagnosis not present

## 2018-12-23 DIAGNOSIS — I10 Essential (primary) hypertension: Secondary | ICD-10-CM | POA: Diagnosis not present

## 2018-12-23 DIAGNOSIS — N289 Disorder of kidney and ureter, unspecified: Secondary | ICD-10-CM | POA: Diagnosis not present

## 2018-12-23 DIAGNOSIS — Z0000 Encounter for general adult medical examination without abnormal findings: Secondary | ICD-10-CM | POA: Diagnosis not present

## 2018-12-28 DIAGNOSIS — K5909 Other constipation: Secondary | ICD-10-CM | POA: Insufficient documentation

## 2018-12-28 DIAGNOSIS — H9193 Unspecified hearing loss, bilateral: Secondary | ICD-10-CM | POA: Insufficient documentation

## 2018-12-30 DIAGNOSIS — R69 Illness, unspecified: Secondary | ICD-10-CM | POA: Diagnosis not present

## 2018-12-31 DIAGNOSIS — H903 Sensorineural hearing loss, bilateral: Secondary | ICD-10-CM | POA: Diagnosis not present

## 2019-01-26 DIAGNOSIS — M47816 Spondylosis without myelopathy or radiculopathy, lumbar region: Secondary | ICD-10-CM | POA: Diagnosis not present

## 2019-01-26 DIAGNOSIS — M5136 Other intervertebral disc degeneration, lumbar region: Secondary | ICD-10-CM | POA: Diagnosis not present

## 2019-03-11 DIAGNOSIS — H5203 Hypermetropia, bilateral: Secondary | ICD-10-CM | POA: Diagnosis not present

## 2019-03-11 DIAGNOSIS — H43813 Vitreous degeneration, bilateral: Secondary | ICD-10-CM | POA: Diagnosis not present

## 2019-03-11 DIAGNOSIS — H25011 Cortical age-related cataract, right eye: Secondary | ICD-10-CM | POA: Diagnosis not present

## 2019-03-11 DIAGNOSIS — H52203 Unspecified astigmatism, bilateral: Secondary | ICD-10-CM | POA: Diagnosis not present

## 2019-03-11 DIAGNOSIS — H35431 Paving stone degeneration of retina, right eye: Secondary | ICD-10-CM | POA: Diagnosis not present

## 2019-03-11 DIAGNOSIS — H40013 Open angle with borderline findings, low risk, bilateral: Secondary | ICD-10-CM | POA: Diagnosis not present

## 2019-03-11 DIAGNOSIS — H2513 Age-related nuclear cataract, bilateral: Secondary | ICD-10-CM | POA: Diagnosis not present

## 2019-03-11 DIAGNOSIS — H04123 Dry eye syndrome of bilateral lacrimal glands: Secondary | ICD-10-CM | POA: Diagnosis not present

## 2019-03-11 DIAGNOSIS — H524 Presbyopia: Secondary | ICD-10-CM | POA: Diagnosis not present

## 2019-05-29 ENCOUNTER — Other Ambulatory Visit: Payer: Self-pay

## 2019-05-29 ENCOUNTER — Encounter: Payer: Self-pay | Admitting: Internal Medicine

## 2019-05-29 ENCOUNTER — Non-Acute Institutional Stay: Payer: Medicare HMO | Admitting: Internal Medicine

## 2019-05-29 VITALS — BP 110/80 | HR 64 | Temp 96.8°F | Wt 139.6 lb

## 2019-05-29 DIAGNOSIS — E785 Hyperlipidemia, unspecified: Secondary | ICD-10-CM

## 2019-05-29 DIAGNOSIS — E039 Hypothyroidism, unspecified: Secondary | ICD-10-CM

## 2019-05-29 DIAGNOSIS — M5136 Other intervertebral disc degeneration, lumbar region: Secondary | ICD-10-CM | POA: Diagnosis not present

## 2019-05-29 DIAGNOSIS — F3341 Major depressive disorder, recurrent, in partial remission: Secondary | ICD-10-CM | POA: Diagnosis not present

## 2019-05-29 DIAGNOSIS — Z0100 Encounter for examination of eyes and vision without abnormal findings: Secondary | ICD-10-CM | POA: Diagnosis not present

## 2019-05-29 DIAGNOSIS — R69 Illness, unspecified: Secondary | ICD-10-CM | POA: Diagnosis not present

## 2019-05-29 NOTE — Progress Notes (Signed)
Location:  [place] of Service:  Clinic (12)  Provider:   Code Status:  Goals of Care: No flowsheet data found.   Chief Complaint  Patient presents with  . Establish care as New Patient    HPI: Patient is a 78 y.o. female seen today for medical management of chronic diseases.   Patient has history of hypertension, ocular migraine, GERD, hypothyroidism, depression, hyperlipidemia, CKD, S/P mitral Valve repair  Patient has recently moved [place] [facility].  She is independent in her ADLs and IADLs.  Did have some falls last year but she said it was because of her shoes.  No recent falls.  Still drives Has a daughter who works in [place]  Hypertension Not on any meds right now Hypothyroid Takes supplement Low back pain MRI showed degenerative disease On neurontin Depression On Zoloft   Past Medical History:  Diagnosis Date  . Anxiety and depression   . History of repair of mitral valve   . Hypothyroid     Past Surgical History:  Procedure Laterality Date  . ABDOMINAL HYSTERECTOMY  1974   Dr. Etta Grandchild, [place]  . MITRAL VALVE REPAIR  2018   Dr. Lunette Stands, [place]    Not on File  Outpatient Encounter Medications as of 05/29/2019  Medication Sig  . Ascorbic Acid (VITAMIN C) 100 MG tablet Take 100 mg by mouth daily.  Marland Kitchen aspirin 81 MG chewable tablet Chew by mouth daily.  Marland Kitchen b complex vitamins tablet Take 1 tablet by mouth daily.  . cholecalciferol (VITAMIN D3) 25 MCG (1000 UT) tablet Take 1,000 Units by mouth daily.  Marland Kitchen gabapentin (NEURONTIN) 100 MG capsule Take 100 mg by mouth. 2 at night  . levothyroxine (SYNTHROID) 25 MCG tablet Take 25 mcg by mouth daily before breakfast.  . MAGNESIUM GLYCINATE PO Take by mouth.  . sertraline (ZOLOFT) 50 MG tablet Take 50 mg by mouth at bedtime.   No facility-administered encounter medications on file as of  05/29/2019.     Review of Systems:  Review of Systems  Review of Systems  Constitutional: Negative for activity change, appetite change, chills, diaphoresis, fatigue and fever.  HENT: Negative for mouth sores, postnasal drip, rhinorrhea, sinus pain and sore throat.   Respiratory: Negative for apnea, cough, chest tightness, shortness of breath and wheezing.   Cardiovascular: Negative for chest pain, palpitations and leg swelling.  Gastrointestinal: Negative for abdominal distention, abdominal pain, constipation, diarrhea, nausea and vomiting.  Genitourinary: Negative for dysuria and frequency.  Musculoskeletal: Negative for arthralgias, joint swelling and myalgias.  Skin: Negative for rash.  Neurological: Negative for dizziness, syncope, weakness, light-headedness and numbness.  Psychiatric/Behavioral: Negative for behavioral problems, confusion and sleep disturbance.     Health Maintenance  Topic Date Due  . TETANUS/TDAP  05/09/1962  . DEXA SCAN  05/09/2008  . PNA vac Low Risk Adult (1 of 2 - PCV13) 05/09/2008  . INFLUENZA VACCINE  02/14/2019    Physical Exam: Vitals:   05/29/19 1016  BP: 110/80  Pulse: 64  Temp: (!) 96.8 F (36 C)  SpO2: 97%  Weight: 139 lb 9.6 oz (63.3 kg)   There is no height or weight on file to calculate BMI. Physical Exam  Constitutional: Oriented to person, place, and time. Well-developed and well-nourished.  HENT:  Head: Normocephalic.  Mouth/Throat: Oropharynx is clear and moist.  Eyes: Pupils are equal,  round, and reactive to light.  Neck: Neck supple.  Cardiovascular: Normal rate and normal heart sounds.  No murmur heard. Pulmonary/Chest: Effort normal and breath sounds normal. No respiratory distress. No wheezes. She has no rales.  Abdominal: Soft. Bowel sounds are normal. No distension. There is no tenderness. There is no rebound.  Musculoskeletal: No edema.  Lymphadenopathy: none Neurological: Alert and oriented to person, place, and  time.  Gait stable Skin: Skin is warm and dry.  Psychiatric: Normal mood and affect. Behavior is normal. Thought content normal.    Labs reviewed: Basic Metabolic Panel: No results for input(s): NA, K, CL, CO2, GLUCOSE, BUN, CREATININE, CALCIUM, MG, PHOS, TSH in the last 8760 hours. Liver Function Tests: No results for input(s): AST, ALT, ALKPHOS, BILITOT, PROT, ALBUMIN in the last 8760 hours. No results for input(s): LIPASE, AMYLASE in the last 8760 hours. No results for input(s): AMMONIA in the last 8760 hours. CBC: No results for input(s): WBC, NEUTROABS, HGB, HCT, MCV, PLT in the last 8760 hours. Lipid Panel: No results for input(s): CHOL, HDL, LDLCALC, TRIG, CHOLHDL, LDLDIRECT in the last 8760 hours. No results found for: HGBA1C  Procedures since last visit: No results found.  Assessment/Plan Hypothyroidism, unspecified type Repeat TSH  Lumbar degenerative disc disease Stable on Neurontin  Recurrent major depressive disorder Doing well on Zoloft  Hyperlipidemia, Last LDL was 151 D/W the patient she is not interested in Statin right now Wants to discuss next visit  [place] Immunization were Up to date in [place] [facility] also DEXA in 2019 T score of -2.3 in Femoral neck Due for Colonoscopy but does not want it anymore Will get Mammogram Every Year  Labs/tests ordered:  * No order type specified * Next appt:  Visit date not found   Total time spent in this patient care encounter was  45_  minutes; greater than 50% of the visit spent counseling patient and staff, reviewing records , Labs and coordinating care for problems addressed at this encounter.

## 2019-06-03 DIAGNOSIS — R001 Bradycardia, unspecified: Secondary | ICD-10-CM | POA: Diagnosis not present

## 2019-06-03 DIAGNOSIS — Z9889 Other specified postprocedural states: Secondary | ICD-10-CM | POA: Diagnosis not present

## 2019-06-05 ENCOUNTER — Other Ambulatory Visit: Payer: Self-pay

## 2019-06-05 DIAGNOSIS — Z20822 Contact with and (suspected) exposure to covid-19: Secondary | ICD-10-CM

## 2019-06-07 LAB — NOVEL CORONAVIRUS, NAA: SARS-CoV-2, NAA: NOT DETECTED

## 2019-06-24 DIAGNOSIS — E039 Hypothyroidism, unspecified: Secondary | ICD-10-CM | POA: Diagnosis not present

## 2019-06-24 DIAGNOSIS — E785 Hyperlipidemia, unspecified: Secondary | ICD-10-CM | POA: Diagnosis not present

## 2019-06-25 ENCOUNTER — Other Ambulatory Visit: Payer: Self-pay

## 2019-06-25 DIAGNOSIS — E785 Hyperlipidemia, unspecified: Secondary | ICD-10-CM

## 2019-06-25 DIAGNOSIS — E039 Hypothyroidism, unspecified: Secondary | ICD-10-CM

## 2019-06-26 ENCOUNTER — Other Ambulatory Visit: Payer: Self-pay

## 2019-06-26 DIAGNOSIS — M5136 Other intervertebral disc degeneration, lumbar region: Secondary | ICD-10-CM

## 2019-06-26 DIAGNOSIS — F3341 Major depressive disorder, recurrent, in partial remission: Secondary | ICD-10-CM

## 2019-06-26 LAB — CBC WITH DIFFERENTIAL/PLATELET
Absolute Monocytes: 432 cells/uL (ref 200–950)
Basophils Absolute: 19 cells/uL (ref 0–200)
Basophils Relative: 0.4 %
Eosinophils Absolute: 61 cells/uL (ref 15–500)
Eosinophils Relative: 1.3 %
HCT: 43.5 % (ref 35.0–45.0)
Hemoglobin: 14.9 g/dL (ref 11.7–15.5)
Lymphs Abs: 1880 cells/uL (ref 850–3900)
MCH: 32.3 pg (ref 27.0–33.0)
MCHC: 34.3 g/dL (ref 32.0–36.0)
MCV: 94.2 fL (ref 80.0–100.0)
MPV: 10.3 fL (ref 7.5–12.5)
Monocytes Relative: 9.2 %
Neutro Abs: 2308 cells/uL (ref 1500–7800)
Neutrophils Relative %: 49.1 %
Platelets: 298 10*3/uL (ref 140–400)
RBC: 4.62 10*6/uL (ref 3.80–5.10)
RDW: 12.4 % (ref 11.0–15.0)
Total Lymphocyte: 40 %
WBC: 4.7 10*3/uL (ref 3.8–10.8)

## 2019-06-26 LAB — COMPLETE METABOLIC PANEL WITH GFR
AG Ratio: 1.6 (calc) (ref 1.0–2.5)
ALT: 12 U/L (ref 6–29)
AST: 20 U/L (ref 10–35)
Albumin: 4.3 g/dL (ref 3.6–5.1)
Alkaline phosphatase (APISO): 73 U/L (ref 37–153)
BUN/Creatinine Ratio: 15 (calc) (ref 6–22)
BUN: 16 mg/dL (ref 7–25)
CO2: 25 mmol/L (ref 20–32)
Calcium: 9.6 mg/dL (ref 8.6–10.4)
Chloride: 104 mmol/L (ref 98–110)
Creat: 1.05 mg/dL — ABNORMAL HIGH (ref 0.60–0.93)
GFR, Est African American: 60 mL/min/{1.73_m2} (ref >=60)
GFR, Est Non African American: 52 mL/min/{1.73_m2} — ABNORMAL LOW (ref >=60)
Globulin: 2.7 g/dL (calc) (ref 1.9–3.7)
Glucose, Bld: 103 mg/dL — ABNORMAL HIGH (ref 65–99)
Potassium: 4.4 mmol/L (ref 3.5–5.3)
Sodium: 141 mmol/L (ref 135–146)
Total Bilirubin: 0.5 mg/dL (ref 0.2–1.2)
Total Protein: 7 g/dL (ref 6.1–8.1)

## 2019-06-26 LAB — LIPID PANEL
Cholesterol: 204 mg/dL — ABNORMAL HIGH (ref <200)
HDL: 54 mg/dL (ref >=50)
LDL Cholesterol (Calc): 122 mg/dL (calc) — ABNORMAL HIGH
Non-HDL Cholesterol (Calc): 150 mg/dL (calc) — ABNORMAL HIGH (ref <130)
Total CHOL/HDL Ratio: 3.8 (calc) (ref <5.0)
Triglycerides: 166 mg/dL — ABNORMAL HIGH (ref <150)

## 2019-06-26 LAB — TSH: TSH: 5 mIU/L — ABNORMAL HIGH (ref 0.40–4.50)

## 2019-06-26 MED ORDER — SERTRALINE HCL 50 MG PO TABS: 50.0000 mg | ORAL_TABLET | Freq: Every day | ORAL | 0 refills | Status: DC

## 2019-06-26 MED ORDER — GABAPENTIN 100 MG PO CAPS: 200.0000 mg | ORAL_CAPSULE | Freq: Every day | ORAL | 0 refills | Status: DC

## 2019-06-30 ENCOUNTER — Telehealth: Payer: Self-pay | Admitting: *Deleted

## 2019-06-30 DIAGNOSIS — F3341 Major depressive disorder, recurrent, in partial remission: Secondary | ICD-10-CM

## 2019-06-30 MED ORDER — SERTRALINE HCL 50 MG PO TABS: 50.0000 mg | ORAL_TABLET | Freq: Every day | ORAL | 0 refills | Status: DC

## 2019-06-30 NOTE — Telephone Encounter (Signed)
Patient called and stated that she and her husband took a trip to [place] this past weekend and they stopped at a gas station and she was cleaning out the car and throwing away the trash and accidentally threw away her Medication of Sertraline that was in the medication bag. Patient is needing a new Rx sent to pharmacy. Please Advise.

## 2019-06-30 NOTE — Addendum Note (Signed)
Addended by: Rafael Bihari A on: 06/30/2019 02:19 PM   Modules accepted: Orders

## 2019-06-30 NOTE — Telephone Encounter (Signed)
Rx faxed to pharmacy  

## 2019-06-30 NOTE — Telephone Encounter (Signed)
Yes it should be fine.

## 2019-07-15 ENCOUNTER — Telehealth: Payer: Self-pay

## 2019-07-15 NOTE — Telephone Encounter (Signed)
Patient called and states husband is going to have cataract surgery in 10 days or more and was told to isolate before surgery. She would like to know if she needs to isolate as well.   Patient would alsolike to take part in a study at [facility] for cholesterol medication drug. Patient would like to know if it would be ok to take part in this study.   Routing to provider to advise

## 2019-07-16 NOTE — Telephone Encounter (Signed)
Discussed providers response with patient. She verbalized understanding and states she will isolate with husband at this time. She also states she will get more information and discuss with provider before signing up for placebo group.

## 2019-07-16 NOTE — Telephone Encounter (Signed)
Yes she has to isolate herself too. As they live together and she can bring the virus to him. I am not sure about the study. Her Cholesterol is elevated and if she is put in Placebo group and if  they going to not give her any statins , what would be the risk for that. I would suggest to get more information and discuss with me before signing up.

## 2019-07-22 ENCOUNTER — Other Ambulatory Visit: Payer: Self-pay | Admitting: Internal Medicine

## 2019-07-22 DIAGNOSIS — F3341 Major depressive disorder, recurrent, in partial remission: Secondary | ICD-10-CM

## 2019-07-22 NOTE — Telephone Encounter (Signed)
Patient is requesting 90 day supply a few days too soon. Routing to provider for approval.

## 2019-08-06 DIAGNOSIS — Z791 Long term (current) use of non-steroidal anti-inflammatories (NSAID): Secondary | ICD-10-CM | POA: Diagnosis not present

## 2019-08-06 DIAGNOSIS — Z79899 Other long term (current) drug therapy: Secondary | ICD-10-CM | POA: Diagnosis not present

## 2019-08-06 DIAGNOSIS — M47896 Other spondylosis, lumbar region: Secondary | ICD-10-CM | POA: Diagnosis not present

## 2019-08-06 DIAGNOSIS — M5416 Radiculopathy, lumbar region: Secondary | ICD-10-CM | POA: Diagnosis not present

## 2019-08-06 DIAGNOSIS — M5126 Other intervertebral disc displacement, lumbar region: Secondary | ICD-10-CM | POA: Diagnosis not present

## 2019-08-06 DIAGNOSIS — M5136 Other intervertebral disc degeneration, lumbar region: Secondary | ICD-10-CM | POA: Diagnosis not present

## 2019-08-19 ENCOUNTER — Telehealth: Payer: Self-pay | Admitting: *Deleted

## 2019-08-19 NOTE — Telephone Encounter (Signed)
Confirmed no labs have been ordered. Dr. Lyndel Safe, do you want labs done on her before her 3/19 visit?

## 2019-08-19 NOTE — Telephone Encounter (Signed)
Patient called and stated that she wants to speak with Dr. Steve Rattler [facility] about scheduling her an appointment to have labs done at [facility] for a TSH before her appointment on 10/02/19.   No TSH has been ordered. Please Advise.

## 2019-08-20 ENCOUNTER — Other Ambulatory Visit: Payer: Self-pay | Admitting: Internal Medicine

## 2019-08-20 DIAGNOSIS — E785 Hyperlipidemia, unspecified: Secondary | ICD-10-CM

## 2019-08-20 DIAGNOSIS — E039 Hypothyroidism, unspecified: Secondary | ICD-10-CM

## 2019-08-20 NOTE — Telephone Encounter (Signed)
I will Order labs for her especially TSH before her visit. You can schedule her for the Lab visit and I will Place the Order

## 2019-08-27 ENCOUNTER — Encounter: Payer: Self-pay | Admitting: Nurse Practitioner

## 2019-09-16 DIAGNOSIS — H04123 Dry eye syndrome of bilateral lacrimal glands: Secondary | ICD-10-CM | POA: Diagnosis not present

## 2019-09-16 DIAGNOSIS — H40013 Open angle with borderline findings, low risk, bilateral: Secondary | ICD-10-CM | POA: Diagnosis not present

## 2019-09-23 DIAGNOSIS — E785 Hyperlipidemia, unspecified: Secondary | ICD-10-CM | POA: Diagnosis not present

## 2019-09-23 DIAGNOSIS — E039 Hypothyroidism, unspecified: Secondary | ICD-10-CM | POA: Diagnosis not present

## 2019-09-24 ENCOUNTER — Other Ambulatory Visit: Payer: Self-pay

## 2019-09-24 DIAGNOSIS — E785 Hyperlipidemia, unspecified: Secondary | ICD-10-CM

## 2019-09-24 DIAGNOSIS — E039 Hypothyroidism, unspecified: Secondary | ICD-10-CM

## 2019-09-24 LAB — BASIC METABOLIC PANEL
BUN/Creatinine Ratio: 14 (calc) (ref 6–22)
BUN: 13 mg/dL (ref 7–25)
CO2: 27 mmol/L (ref 20–32)
Calcium: 9.6 mg/dL (ref 8.6–10.4)
Chloride: 104 mmol/L (ref 98–110)
Creat: 0.96 mg/dL — ABNORMAL HIGH (ref 0.60–0.93)
Glucose, Bld: 98 mg/dL (ref 65–99)
Potassium: 4.5 mmol/L (ref 3.5–5.3)
Sodium: 138 mmol/L (ref 135–146)

## 2019-09-24 LAB — TSH: TSH: 5.62 mIU/L — ABNORMAL HIGH (ref 0.40–4.50)

## 2019-09-24 LAB — LIPID PANEL
Cholesterol: 207 mg/dL — ABNORMAL HIGH (ref <200)
HDL: 64 mg/dL (ref >=50)
LDL Cholesterol (Calc): 118 mg/dL (calc) — ABNORMAL HIGH
Non-HDL Cholesterol (Calc): 143 mg/dL (calc) — ABNORMAL HIGH (ref <130)
Total CHOL/HDL Ratio: 3.2 (calc) (ref <5.0)
Triglycerides: 140 mg/dL (ref <150)

## 2019-10-02 ENCOUNTER — Encounter: Payer: Self-pay | Admitting: Internal Medicine

## 2019-10-02 ENCOUNTER — Other Ambulatory Visit: Payer: Self-pay

## 2019-10-02 ENCOUNTER — Non-Acute Institutional Stay: Payer: Medicare HMO | Admitting: Internal Medicine

## 2019-10-02 VITALS — BP 112/78 | HR 72 | Temp 97.1°F | Wt 135.6 lb

## 2019-10-02 DIAGNOSIS — M545 Low back pain: Secondary | ICD-10-CM | POA: Diagnosis not present

## 2019-10-02 DIAGNOSIS — E785 Hyperlipidemia, unspecified: Secondary | ICD-10-CM

## 2019-10-02 DIAGNOSIS — E039 Hypothyroidism, unspecified: Secondary | ICD-10-CM

## 2019-10-02 DIAGNOSIS — F3341 Major depressive disorder, recurrent, in partial remission: Secondary | ICD-10-CM

## 2019-10-02 DIAGNOSIS — G8929 Other chronic pain: Secondary | ICD-10-CM

## 2019-10-02 DIAGNOSIS — R69 Illness, unspecified: Secondary | ICD-10-CM | POA: Diagnosis not present

## 2019-10-02 DIAGNOSIS — M8580 Other specified disorders of bone density and structure, unspecified site: Secondary | ICD-10-CM | POA: Diagnosis not present

## 2019-10-02 MED ORDER — PRAVASTATIN SODIUM 10 MG PO TABS: 10.0000 mg | ORAL_TABLET | Freq: Every day | ORAL | 3 refills | Status: DC

## 2019-10-02 MED ORDER — LORAZEPAM 0.5 MG PO TABS: 0.5000 mg | ORAL_TABLET | Freq: Every evening | ORAL | 0 refills | Status: DC | PRN

## 2019-10-02 NOTE — Progress Notes (Signed)
Location:  [place] of Service:  Clinic (12)  Provider:   Code Status:  Goals of Care: No flowsheet data found.   Chief Complaint  Patient presents with  . Medical Management of Chronic Issues    Follow up    HPI: Patient is a 79 y.o. female seen today for medical management of chronic diseases.   Patient has history of hypertension, ocular migraine, GERD, hypothyroidism, depression, hyperlipidemia, CKD, S/P mitral Valve repair  Patient has recently [place] [facility].  She is independent in her ADLs and IADLs. Daughter works in [place] [employer] wants to renew her prescription. Says her 30 tabs last her for almost 6 months Also on Zoloft Hypothyroid Has recently changed the time to night  Low Back Pain Sees Dr Arrie Eastern and got Epidural steroid injections in Jan 21 Uses Neurontin 200 mg QHS Recently was trying to  taper her dose but now having Restless Leg and Nightmares Also Uses CBD oil  No other Active issues Sees Ophthalmologist Q 6 months Dentis Q1 year  Past Medical History:  Diagnosis Date  . Anxiety and depression   . History of repair of mitral valve   . Hypothyroid     Past Surgical History:  Procedure Laterality Date  . ABDOMINAL HYSTERECTOMY  1974   Dr. Etta Grandchild, [place]  . MITRAL VALVE REPAIR  2018   Dr. Lunette Stands, [place]    Allergies  Allergen Reactions  . Penicillin G Benzathine & Proc Other (See Comments)    Unknown.    Outpatient Encounter Medications as of 10/02/2019  Medication Sig  . Ascorbic Acid (VITAMIN C) 100 MG tablet Take 100 mg by mouth daily.  Marland Kitchen aspirin 81 MG chewable tablet Chew by mouth daily.  Marland Kitchen b complex vitamins tablet Take 1 tablet by mouth daily.  . cholecalciferol (VITAMIN D3) 25 MCG (1000 UT) tablet Take 1,000 Units by mouth daily.  Marland Kitchen gabapentin (NEURONTIN) 100 MG capsule Take 2  capsules (200 mg total) by mouth at bedtime. 2 tablets to = 200 mg  . levothyroxine (SYNTHROID) 25 MCG tablet Take 25 mcg by mouth daily before breakfast.  . MAGNESIUM GLYCINATE PO Take by mouth.  . sertraline (ZOLOFT) 50 MG tablet TAKE 1 TABLET BY MOUTH EVERYDAY AT BEDTIME  . [DISCONTINUED] sertraline (ZOLOFT) 50 MG tablet Take 1 tablet (50 mg total) by mouth at bedtime.   No facility-administered encounter medications on file as of 10/02/2019.    Review of Systems:  Review of Systems  Constitutional: Negative.   HENT: Negative.   Respiratory: Negative.   Cardiovascular: Negative.   Gastrointestinal: Negative.   Genitourinary: Negative.   Musculoskeletal: Positive for back pain.  Skin: Negative.   Neurological: Negative.   Psychiatric/Behavioral: Positive for sleep disturbance.    Health Maintenance  Topic Date Due  . DEXA SCAN  Never done  . INFLUENZA VACCINE  Never done  . TETANUS/TDAP  08/15/2021  . PNA vac Low Risk Adult  Completed    Physical Exam: Vitals:   10/02/19 1013  BP: 112/78  Pulse: 72  Temp: (!) 97.1 F (36.2 C)  SpO2: 97%  Weight: 135 lb 9.6 oz (61.5 kg)   There is no height or weight on file to calculate BMI. Physical Exam  Constitutional: Oriented to person, place, and time. Well-developed and well-nourished.  HENT:  Head: Normocephalic.  Mouth/Throat: Oropharynx is clear and moist.  Eyes: Pupils are equal, round, and reactive to light.  Ear Mild wax bilateral TM Normal Neck: Neck supple.  Cardiovascular: Normal rate and normal heart sounds.  No murmur heard. Pulmonary/Chest: Effort normal and breath sounds normal. No respiratory distress. No wheezes. She has no rales.  Abdominal: Soft. Bowel sounds are normal. No distension. There is no tenderness. There is no rebound.  Musculoskeletal: No edema.  Lymphadenopathy: none Neurological: Alert and oriented to person, place, and time.  Skin: Skin is warm and dry.  Psychiatric: Normal mood and  affect. Behavior is normal. Thought content normal.    Labs reviewed: Basic Metabolic Panel: Recent Labs    06/24/19 1435 09/23/19 1054  NA 141 138  K 4.4 4.5  CL 104 104  CO2 25 27  GLUCOSE 103* 98  BUN 16 13  CREATININE 1.05* 0.96*  CALCIUM 9.6 9.6  TSH 5.00* 5.62*   Liver Function Tests: Recent Labs    06/24/19 1435  AST 20  ALT 12  BILITOT 0.5  PROT 7.0   No results for input(s): LIPASE, AMYLASE in the last 8760 hours. No results for input(s): AMMONIA in the last 8760 hours. CBC: Recent Labs    06/24/19 1435  WBC 4.7  NEUTROABS 2,308  HGB 14.9  HCT 43.5  MCV 94.2  PLT 298   Lipid Panel: Recent Labs    06/24/19 1435 09/23/19 1054  CHOL 204* 207*  HDL 54 64  LDLCALC 122* 118*  TRIG 166* 140  CHOLHDL 3.8 3.2   No results found for: HGBA1C  Procedures since last visit: No results found.  Assessment/Plan Osteopenia, unspecified location - Plan: [facility] DEXA SCAN Calcium and Vit D DEXA in 2019 T score of -2.3 in Femoral neck  Hyperlipidemia, Agrees to start Pravachol 10 mg  Hypothyroidism, unspecified type TSH Mildily High No change in the dose Repeat in 4 months  Recurrent major depressive disorder,  Doing well on Zoloft Ativan low dose PRN   Chronic bilateral low back pain without sciatica Continue Neurontin 200 mg QHS  [facility] Immunization were Up to date in [place] [facility] also DEXA in 2019 T score of -2.3 in Femoral neck Due for Colonoscopy but does not want it anymore Will get Mammogram Every Year   Labs/tests ordered:  * No order type specified * Next appt:  Visit date not found   Total time spent in this patient care encounter was  _45  minutes; greater than 50% of the visit spent counseling patient and staff, reviewing records , Labs and coordinating care for problems addressed at this encounter.

## 2019-11-27 ENCOUNTER — Encounter: Payer: Self-pay | Admitting: Internal Medicine

## 2019-12-25 ENCOUNTER — Other Ambulatory Visit: Payer: Self-pay | Admitting: Internal Medicine

## 2020-01-11 ENCOUNTER — Other Ambulatory Visit: Payer: Self-pay | Admitting: Internal Medicine

## 2020-01-11 DIAGNOSIS — F3341 Major depressive disorder, recurrent, in partial remission: Secondary | ICD-10-CM

## 2020-01-26 DIAGNOSIS — L820 Inflamed seborrheic keratosis: Secondary | ICD-10-CM | POA: Diagnosis not present

## 2020-01-26 DIAGNOSIS — L570 Actinic keratosis: Secondary | ICD-10-CM | POA: Diagnosis not present

## 2020-01-26 DIAGNOSIS — D1801 Hemangioma of skin and subcutaneous tissue: Secondary | ICD-10-CM | POA: Diagnosis not present

## 2020-01-26 DIAGNOSIS — L719 Rosacea, unspecified: Secondary | ICD-10-CM | POA: Diagnosis not present

## 2020-01-26 DIAGNOSIS — L821 Other seborrheic keratosis: Secondary | ICD-10-CM | POA: Diagnosis not present

## 2020-01-26 DIAGNOSIS — L814 Other melanin hyperpigmentation: Secondary | ICD-10-CM | POA: Diagnosis not present

## 2020-02-01 ENCOUNTER — Other Ambulatory Visit: Payer: Self-pay | Admitting: Internal Medicine

## 2020-02-01 DIAGNOSIS — E039 Hypothyroidism, unspecified: Secondary | ICD-10-CM

## 2020-02-01 DIAGNOSIS — M8580 Other specified disorders of bone density and structure, unspecified site: Secondary | ICD-10-CM

## 2020-02-01 DIAGNOSIS — R69 Illness, unspecified: Secondary | ICD-10-CM | POA: Diagnosis not present

## 2020-02-01 DIAGNOSIS — E785 Hyperlipidemia, unspecified: Secondary | ICD-10-CM

## 2020-02-02 ENCOUNTER — Other Ambulatory Visit: Payer: Self-pay

## 2020-02-02 DIAGNOSIS — M8580 Other specified disorders of bone density and structure, unspecified site: Secondary | ICD-10-CM | POA: Diagnosis not present

## 2020-02-02 DIAGNOSIS — E039 Hypothyroidism, unspecified: Secondary | ICD-10-CM | POA: Diagnosis not present

## 2020-02-02 DIAGNOSIS — E785 Hyperlipidemia, unspecified: Secondary | ICD-10-CM | POA: Diagnosis not present

## 2020-02-03 LAB — COMPLETE METABOLIC PANEL WITH GFR
AG Ratio: 1.8 (calc) (ref 1.0–2.5)
ALT: 17 U/L (ref 6–29)
AST: 26 U/L (ref 10–35)
Albumin: 4.4 g/dL (ref 3.6–5.1)
Alkaline phosphatase (APISO): 61 U/L (ref 37–153)
BUN/Creatinine Ratio: 14 (calc) (ref 6–22)
BUN: 15 mg/dL (ref 7–25)
CO2: 26 mmol/L (ref 20–32)
Calcium: 9.2 mg/dL (ref 8.6–10.4)
Chloride: 104 mmol/L (ref 98–110)
Creat: 1.05 mg/dL — ABNORMAL HIGH (ref 0.60–0.93)
GFR, Est African American: 60 mL/min/{1.73_m2} (ref >=60)
GFR, Est Non African American: 52 mL/min/{1.73_m2} — ABNORMAL LOW (ref >=60)
Globulin: 2.4 g/dL (calc) (ref 1.9–3.7)
Glucose, Bld: 92 mg/dL (ref 65–99)
Potassium: 4.7 mmol/L (ref 3.5–5.3)
Sodium: 140 mmol/L (ref 135–146)
Total Bilirubin: 0.7 mg/dL (ref 0.2–1.2)
Total Protein: 6.8 g/dL (ref 6.1–8.1)

## 2020-02-03 LAB — CBC WITH DIFFERENTIAL/PLATELET
Absolute Monocytes: 381 cells/uL (ref 200–950)
Basophils Absolute: 19 cells/uL (ref 0–200)
Basophils Relative: 0.4 %
Eosinophils Absolute: 221 cells/uL (ref 15–500)
Eosinophils Relative: 4.7 %
HCT: 43.8 % (ref 35.0–45.0)
Hemoglobin: 15.1 g/dL (ref 11.7–15.5)
Lymphs Abs: 1974 cells/uL (ref 850–3900)
MCH: 32.3 pg (ref 27.0–33.0)
MCHC: 34.5 g/dL (ref 32.0–36.0)
MCV: 93.6 fL (ref 80.0–100.0)
MPV: 10 fL (ref 7.5–12.5)
Monocytes Relative: 8.1 %
Neutro Abs: 2106 cells/uL (ref 1500–7800)
Neutrophils Relative %: 44.8 %
Platelets: 270 10*3/uL (ref 140–400)
RBC: 4.68 10*6/uL (ref 3.80–5.10)
RDW: 12.4 % (ref 11.0–15.0)
Total Lymphocyte: 42 %
WBC: 4.7 10*3/uL (ref 3.8–10.8)

## 2020-02-03 LAB — LIPID PANEL
Cholesterol: 147 mg/dL (ref <200)
HDL: 53 mg/dL (ref >=50)
LDL Cholesterol (Calc): 75 mg/dL (calc)
Non-HDL Cholesterol (Calc): 94 mg/dL (calc) (ref <130)
Total CHOL/HDL Ratio: 2.8 (calc) (ref <5.0)
Triglycerides: 104 mg/dL (ref <150)

## 2020-02-03 LAB — T3, FREE: T3, Free: 3 pg/mL (ref 2.3–4.2)

## 2020-02-03 LAB — T4, FREE: Free T4: 1.1 ng/dL (ref 0.8–1.8)

## 2020-02-03 LAB — TSH: TSH: 4.03 mIU/L (ref 0.40–4.50)

## 2020-02-05 ENCOUNTER — Encounter: Payer: Self-pay | Admitting: Internal Medicine

## 2020-02-11 ENCOUNTER — Other Ambulatory Visit: Payer: Self-pay

## 2020-02-11 ENCOUNTER — Non-Acute Institutional Stay: Payer: Medicare HMO | Admitting: Nurse Practitioner

## 2020-02-11 ENCOUNTER — Encounter: Payer: Self-pay | Admitting: Nurse Practitioner

## 2020-02-11 DIAGNOSIS — N183 Chronic kidney disease, stage 3 unspecified: Secondary | ICD-10-CM | POA: Insufficient documentation

## 2020-02-11 DIAGNOSIS — N951 Menopausal and female climacteric states: Secondary | ICD-10-CM

## 2020-02-11 DIAGNOSIS — G43801 Other migraine, not intractable, with status migrainosus: Secondary | ICD-10-CM | POA: Diagnosis not present

## 2020-02-11 DIAGNOSIS — F3341 Major depressive disorder, recurrent, in partial remission: Secondary | ICD-10-CM

## 2020-02-11 DIAGNOSIS — M5416 Radiculopathy, lumbar region: Secondary | ICD-10-CM | POA: Diagnosis not present

## 2020-02-11 DIAGNOSIS — Z954 Presence of other heart-valve replacement: Secondary | ICD-10-CM

## 2020-02-11 DIAGNOSIS — I129 Hypertensive chronic kidney disease with stage 1 through stage 4 chronic kidney disease, or unspecified chronic kidney disease: Secondary | ICD-10-CM

## 2020-02-11 DIAGNOSIS — Z7189 Other specified counseling: Secondary | ICD-10-CM | POA: Diagnosis not present

## 2020-02-11 DIAGNOSIS — E039 Hypothyroidism, unspecified: Secondary | ICD-10-CM | POA: Diagnosis not present

## 2020-02-11 DIAGNOSIS — K219 Gastro-esophageal reflux disease without esophagitis: Secondary | ICD-10-CM | POA: Insufficient documentation

## 2020-02-11 DIAGNOSIS — R69 Illness, unspecified: Secondary | ICD-10-CM | POA: Diagnosis not present

## 2020-02-11 DIAGNOSIS — E785 Hyperlipidemia, unspecified: Secondary | ICD-10-CM | POA: Diagnosis not present

## 2020-02-11 DIAGNOSIS — N1831 Chronic kidney disease, stage 3a: Secondary | ICD-10-CM

## 2020-02-11 NOTE — Assessment & Plan Note (Signed)
Chronic, last inj 07/2019, continue Gabapentin

## 2020-02-11 NOTE — Patient Instructions (Signed)
F/u Dr. Lyndel Safe 3-4 months, pending referral to endocrinology.

## 2020-02-11 NOTE — Assessment & Plan Note (Signed)
S/p Mitral valve repair ° °

## 2020-02-11 NOTE — Assessment & Plan Note (Signed)
Blood pressure is controlled, continue ASA, eGFR 52 02/02/20

## 2020-02-11 NOTE — Progress Notes (Signed)
Location:   clinic [place]   Place of Service:  Clinic (12) Provider: Marlana Latus NP  Code Status: DNR Goals of Care: No flowsheet data found.   Chief Complaint  Patient presents with  . Medical Management of Chronic Issues    Patient retuns to the clinic for her 4 month follow up.  Marland Kitchen Health Maintenance    Dexa scan, Hep C screen    HPI: Patient is a 79 y.o. female seen today for medical management of chronic diseases.    HTN ASA 17m qd  Ocular migraine stable.   GERD diet controlled  Hypothyroidism Levothyroxine, TSH 4.03 02/02/20  Depression/anxieyt, prn Lorazepam, Sertraline. Declined depression.   CKD 02/02/20 eGFR 52  S/p Mitral valve repair  Chronic lower back pain, inj 07/2019, Gabapentin, ? Restless legs, nighmares  Hyperlipidemia, LDL 75 02/02/20, Pravastatin      Past Medical History:  Diagnosis Date  . Anxiety and depression   . History of repair of mitral valve   . Hypothyroid     Past Surgical History:  Procedure Laterality Date  . ABDOMINAL HYSTERECTOMY  1974   Dr. [place]ta [place]child [place] . MITRAL VALVE REPA[place]  [place]   [place]    Allergies  Allergen Reactions  . Penicillin G Benzathine & Proc Other (See Comments)    Unknown.    Allergies as of 02/11/2020      Reactions   Penicillin G Benzathine & Proc Other (See Comments)   Unknown.      Medication List       Accurate as of February 11, 2020  2:26 PM. If you have any questions, ask your nurse or doctor.        STOP taking these medications   gabapentin 100 MG capsule Commonly known as: NEURONTIN Stopped by: Oather Muilenburg X Kavi Almquist, NP     TAKE these medications   aspirin 81 MG chewable tablet Chew by mouth daily.   b complex vitamins tablet Take 1 tablet by mouth daily.   calcium-vitamin D 250-125 MG-UNIT tablet Commonly known as: OSCAL WITH D Take 1 tablet by mouth daily. Dose unknown-Patient to find out.    cholecalciferol 25 MCG (1000 UNIT) tablet Commonly known as: VITAMIN D3 Take 1,000 Units by mouth daily.   MAGNESIUM GLYCINATE PO Take 2 capsules by mouth daily.   pravastatin 10 MG tablet Commonly known as: PRAVACHOL TAKE 1 TABLET BY MOUTH EVERY DAY   sertraline 50 MG tablet Commonly known as: ZOLOFT TAKE 1 TABLET BY MOUTH EVERYDAY AT BEDTIME   Synthroid 25 MCG tablet Generic drug: levothyroxine Take 25 mcg by mouth daily before breakfast.   vitamin C 100 MG tablet Take 100 mg by mouth daily.       Review of Systems:  Review of Systems  Constitutional: Negative for activity change, appetite change and unexpected weight change.  HENT: Negative for congestion and voice change.   Eyes: Negative for visual disturbance.       Dry eyes  Respiratory: Negative for cough, shortness of breath and wheezing.   Gastrointestinal: Negative for abdominal pain, constipation, nausea and vomiting.  Genitourinary: Negative for difficulty urinating, dysuria and frequency.  Musculoskeletal: Positive for back pain.       No pain presently.   Skin: Negative for color change and pallor.  Neurological: Negative for speech difficulty, weakness, light-headedness and headaches.  Psychiatric/Behavioral: Positive for sleep disturbance. Negative for behavioral  problems and confusion. The patient is not nervous/anxious.        Occasional     Health Maintenance  Topic Date Due  . Hepatitis C Screening  Never done  . DEXA SCAN  Never done  . INFLUENZA VACCINE  02/14/2020  . TETANUS/TDAP  08/15/2021  . COVID-19 Vaccine  Completed  . PNA vac Low Risk Adult  Completed    Physical Exam: Vitals:   02/11/20 1341  BP: 118/76  Pulse: 63  Temp: 97.8 F (36.6 C)  SpO2: 98%  Weight: 139 lb (63 kg)  Height: _0  (1.6 m)   Body mass index is 24.62 kg/m. Physical Exam Vitals and nursing note reviewed.  Constitutional:      Appearance: Normal appearance.  HENT:     Head: Normocephalic and  atraumatic.     Nose: Nose normal.     Mouth/Throat:     Mouth: Mucous membranes are moist.  Eyes:     Conjunctiva/sclera: Conjunctivae normal.     Pupils: Pupils are equal, round, and reactive to light.     Comments: lwedry eyes  Cardiovascular:     Rate and Rhythm: Normal rate and regular rhythm.     Heart sounds: No murmur heard.   Pulmonary:     Breath sounds: Normal breath sounds. No wheezing, rhonchi or rales.  Abdominal:     General: Bowel sounds are normal.     Palpations: Abdomen is soft.     Tenderness: There is no abdominal tenderness. There is no right CVA tenderness, left CVA tenderness, guarding or rebound.  Musculoskeletal:     Cervical back: Normal range of motion and neck supple.     Right lower leg: No edema.     Left lower leg: No edema.  Skin:    General: Skin is warm and dry.  Neurological:     General: No focal deficit present.     Mental Status: She is alert and oriented to person, place, and time. Mental status is at baseline.     Motor: No weakness.     Coordination: Coordination normal.     Gait: Gait normal.  Psychiatric:        Mood and Affect: Mood normal.        Behavior: Behavior normal.        Thought Content: Thought content normal.        Judgment: Judgment normal.     Labs reviewed: Basic Metabolic Panel: Recent Labs    06/24/19 1435 09/23/19 1054 02/02/20 0715  NA 141 138 140  K 4.4 4.5 4.7  CL 104 104 104  CO2 _1 GLUCOSE 103* 98 92  BUN _2 CREATININE 1.05* 0.96* 1.05*  CALCIUM 9.6 9.6 9.2  TSH 5.00* 5.62* 4.03   Liver Function Tests: Recent Labs    06/24/19 1435 02/02/20 0715  AST 20 26  ALT 12 17  BILITOT 0.5 0.7  PROT 7.0 6.8   No results for input(s): LIPASE, AMYLASE in the last 8760 hours. No results for input(s): AMMONIA in the last 8760 hours. CBC: Recent Labs    06/24/19 1435 02/02/20 0715  WBC 4.7 4.7  NEUTROABS 2,308 2,106  HGB 14.9 15.1  HCT 43.5 43.8  MCV 94.2 93.6  PLT 298 270    Lipid Panel: Recent Labs    06/24/19 1435 09/23/19 1054 02/02/20 0715  CHOL 204* 207* 147  HDL 54 64 53  LDLCALC 122* 118* 75  TRIG 166*  140 104  CHOLHDL 3.8 3.2 2.8   No results found for: HGBA1C  Procedures since last visit: No results found.  Assessment/Plan  Ocular migraine with status migrainosus, not intractable Periodically. x1 only this year. Not severe enough to call or take med for it.   Hypothyroidism TSH 4.03 02/02/20, continue Levothyroxine, c/o fatigue, SOB, vaginal odor one week out of each month, nail brittle, excessive sweating, balding head/brow, left eye dry-f/u Ophthalmology, memory with recalling names.  Lumbar radiculopathy Chronic, last inj 07/2019, continue Gabapentin  Hyperlipidemia LDL at goal, 75 02/02/20, continue Pravastatin  Recurrent major depressive disorder ([place]) Her mood is stable, GDR Sertraline 38m qd/560mqd, continue prn Lorazepam  Status post heart valve replacement S/p Mitral valve repair.   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   Post menopausal syndrome c/o fatigue, SOB, vaginal odor excessive sweating one week out of each month, nail brittle, balding head/brow, left eye dry-f/u Ophthalmology, memory with recalling names.  Delay GYN, observe for vaginal bleed, discharge. S/p Hysterectomy.    Advance care planning MOST 25 minutes, IVF/ABT for defined trial period of time, transfer to hospital if indicated with limited additional intervention, full code   Labs/tests ordered:  None  Next appt:  3-4 months with Dr. GuLyndel Safe

## 2020-02-11 NOTE — Assessment & Plan Note (Addendum)
TSH 4.03 02/02/20, continue Levothyroxine, c/o fatigue, SOB, vaginal odor one week out of each month, nail brittle, excessive sweating, balding head/brow, left eye dry-f/u Ophthalmology, memory with recalling names.

## 2020-02-11 NOTE — Assessment & Plan Note (Addendum)
Periodically. x1 only this year. Not severe enough to call or take med for it.

## 2020-02-11 NOTE — Assessment & Plan Note (Signed)
Diet controlled.  

## 2020-02-11 NOTE — Assessment & Plan Note (Addendum)
c/o fatigue, SOB, vaginal odor excessive sweating one week out of each month, nail brittle, balding head/brow, left eye dry-f/u Ophthalmology, memory with recalling names.  Delay GYN, observe for vaginal bleed, discharge. S/p Hysterectomy.

## 2020-02-11 NOTE — Assessment & Plan Note (Addendum)
Her mood is stable, GDR Sertraline 25mg  qd/50mg  qd, continue prn Lorazepam

## 2020-02-11 NOTE — Assessment & Plan Note (Signed)
MOST 25 minutes, IVF/ABT for defined trial period of time, transfer to hospital if indicated with limited additional intervention, full code

## 2020-02-11 NOTE — Assessment & Plan Note (Signed)
LDL at goal, 75 02/02/20, continue Pravastatin

## 2020-03-01 DIAGNOSIS — L719 Rosacea, unspecified: Secondary | ICD-10-CM | POA: Diagnosis not present

## 2020-03-01 DIAGNOSIS — L820 Inflamed seborrheic keratosis: Secondary | ICD-10-CM | POA: Diagnosis not present

## 2020-03-01 DIAGNOSIS — D1801 Hemangioma of skin and subcutaneous tissue: Secondary | ICD-10-CM | POA: Diagnosis not present

## 2020-03-01 DIAGNOSIS — L821 Other seborrheic keratosis: Secondary | ICD-10-CM | POA: Diagnosis not present

## 2020-03-01 DIAGNOSIS — L814 Other melanin hyperpigmentation: Secondary | ICD-10-CM | POA: Diagnosis not present

## 2020-03-01 DIAGNOSIS — L570 Actinic keratosis: Secondary | ICD-10-CM | POA: Diagnosis not present

## 2020-03-06 ENCOUNTER — Other Ambulatory Visit: Payer: Self-pay | Admitting: Internal Medicine

## 2020-03-29 ENCOUNTER — Telehealth: Payer: Self-pay | Admitting: Internal Medicine

## 2020-03-29 NOTE — Telephone Encounter (Signed)
Ms Kristine [name] called to ask if she could get a referral to an ortho/neuro provider in [place] that does spinal injections. She was having them done with Dr Melvia Heaps in [facility] before she moved. Ms Kristine [name] is not due for injection until Jan 2022 but she's experiencing some pain.  Suggestions were:  [facility], [place], [place], [place], [place]  Please advise,  Kathyrn Lass

## 2020-03-31 ENCOUNTER — Other Ambulatory Visit: Payer: Self-pay

## 2020-03-31 ENCOUNTER — Ambulatory Visit: Payer: Medicare HMO | Admitting: Endocrinology

## 2020-03-31 VITALS — BP 120/80 | HR 63 | Ht 63.0 in | Wt 140.0 lb

## 2020-03-31 DIAGNOSIS — F5101 Primary insomnia: Secondary | ICD-10-CM | POA: Diagnosis not present

## 2020-03-31 DIAGNOSIS — M8580 Other specified disorders of bone density and structure, unspecified site: Secondary | ICD-10-CM

## 2020-03-31 DIAGNOSIS — R69 Illness, unspecified: Secondary | ICD-10-CM | POA: Diagnosis not present

## 2020-03-31 DIAGNOSIS — R5382 Chronic fatigue, unspecified: Secondary | ICD-10-CM | POA: Diagnosis not present

## 2020-03-31 DIAGNOSIS — E063 Autoimmune thyroiditis: Secondary | ICD-10-CM

## 2020-03-31 NOTE — Telephone Encounter (Signed)
It is fine with me

## 2020-03-31 NOTE — Patient Instructions (Addendum)
Check on Bone density  Consider 50mg  Zoloft  1 1/2 Thyroid pills

## 2020-03-31 NOTE — Progress Notes (Signed)
Patient ID: Kristine [name], female   DOB: 23-Jun-1941, 79 y.o.   MRN: [id number]            Referring Provider: Mast, Man, NP  Reason for Appointment:  Hypothyroidism, new visit    History of Present Illness:   Hypothyroidism was first diagnosed  her college years  Not clear what her previous symptoms were She said that she has been on thyroid supplements on and off including after one of her pregnancies         The patient has been treated with  levothyroxine 25 mcg daily for at least the last 8 years Details of this are not available as she was with a different primary care physician Most likely her thyroid levels were found to be low and she was started on supplement and not clear if she had symptoms of fatigue prior to this  With starting thyroid supplementation she may have had a little less fatigue but her fatigue has persisted and she is being referred here for further management She says she wakes up feeling tired and has lack of energy regularly She does try to exercise up to 30 minutes but this requires effort She also thinks she has some weakness in her legs No cold intolerance, dry skin but she thinks she has had a little hair loss and splitting nails Also has tended to gain some weight lately  She has been referred here because of continued fatigue  The patient takes the thyroid supplement before breakfast  Her last TSH in 7/21 was normal even though previously it had been above 5 on the same dose Free T4 and free T3 are normal         Patient's weight history is as follows:  Wt Readings from Last 3 Encounters:  03/31/20 140 lb (63.5 kg)  02/11/20 139 lb (63 kg)  10/02/19 135 lb 9.6 oz (61.5 kg)    Thyroid function results have been as follows:  Lab Results  Component Value Date   TSH 4.03 02/02/2020   TSH 5.62 (H) 09/23/2019   TSH 5.00 (H) 06/24/2019   FREET4 1.1 02/02/2020   T3FREE 3.0 02/02/2020     Past Medical History:  Diagnosis Date  .  Anxiety and depression   . History of repair of mitral valve   . Hypothyroid     Past Surgical History:  Procedure Laterality Date  . ABDOMINAL HYSTERECTOMY  1974   Dr. Etta Grandchild, [place]  . MITRAL VALVE REPAIR  2018   Dr. Lunette Stands, [place]    Family History  Problem Relation Age of Onset  . Parkinson's disease Mother   . Dementia Mother   . Prostate cancer Father   . Stroke Sister   . Anxiety disorder Brother   . Depression Brother   . Panic disorder Brother     Social History:  reports that she has never smoked. She has never used smokeless tobacco. She reports current alcohol use. No history on file for drug use.  Allergies:  Allergies  Allergen Reactions  . Penicillin G Benzathine & Proc Other (See Comments)    Unknown.    Allergies as of 03/31/2020      Reactions   Penicillin G Benzathine & Proc Other (See Comments)   Unknown.      Medication List       Accurate as of March 31, 2020  2:14 PM. If you have  any questions, ask your nurse or doctor.        aspirin 81 MG chewable tablet Chew by mouth daily.   b complex vitamins tablet Take 1 tablet by mouth daily.   calcium-vitamin D 250-125 MG-UNIT tablet Commonly known as: OSCAL WITH D Take 1 tablet by mouth daily. Dose unknown-Patient to find out.   cholecalciferol 25 MCG (1000 UNIT) tablet Commonly known as: VITAMIN D3 Take 1,000 Units by mouth daily.   levothyroxine 25 MCG tablet Commonly known as: SYNTHROID TAKE 1 TABLET (25 MCG TOTAL) BY MOUTH DAILY AT 0600.   MAGNESIUM GLYCINATE PO Take 2 capsules by mouth daily.   pravastatin 10 MG tablet Commonly known as: PRAVACHOL TAKE 1 TABLET BY MOUTH EVERY DAY   sertraline 50 MG tablet Commonly known as: ZOLOFT TAKE 1 TABLET BY MOUTH EVERYDAY AT BEDTIME   vitamin C 100 MG tablet Take 100 mg by mouth daily.          Review of Systems  Constitutional: Positive for weight  gain and diaphoresis. Negative for reduced appetite.  HENT: Negative for trouble swallowing.   Respiratory: Positive for shortness of breath.   Gastrointestinal: Negative for nausea and constipation.  Endocrine: Positive for fatigue.  Musculoskeletal: Positive for joint pain.       She has some back pain persisting.  Has history of lumbar compression fracture, details not available Apparently T score was -2.3 in 2019 but recent bone density done.  Has not been on any treatment She thinks she has lost 2 inches in height since she was young  Neurological: Positive for weakness.  Psychiatric/Behavioral: Positive for insomnia.                Examination:    BP 120/80   Pulse 63   Ht 5\' 3"  (1.6 m)   Wt 140 lb (63.5 kg)   SpO2 95%   BMI 24.80 kg/m   GENERAL:  Average build.   No pallor.    Skin:  no rash or significant skin lesions. Mild loss of eyebrows laterally especially on the right  EYES:  No prominence of the eyes or swelling of the eyelids  ENT: Exam not indicated  NECK: No lymphadenopathy  THYROID:  Not palpable.  HEART:  Normal  S1 and S2; no murmur or click.  CHEST:    Lungs: Vescicular breath sounds heard equally.  No crepitations/ wheeze.  ABDOMEN:  No distention.  Liver and spleen not palpable.  No other mass or tenderness.  NEUROLOGICAL: Reflexes are normal bilaterally at biceps and ankles.  SPINE: She has mild kyphosis of the lower thoracic spine and some prominence of the lower lumbar vertebrae  EXTREMITIES: Mild osteoarthritic changes present in the fingers.    No ankle edema present   Assessment:  HYPOTHYROIDISM, mild and longstanding with only minimal increase in TSH Difficulty to know if this is causing her persistent fatigue her symptoms are not correlating with her thyroid levels She does not clearly feel any better with taking thyroid supplement for the last several years, even with her previous PCP Also has not had any progression of  her mild hypothyroidism with requiring only 25 mcg levothyroxine which she takes regularly  Also free T3 level is in the normal range along with most recent TSH of 4  Currently does not appear to have any other endocrinology problems that may cause fatigue such as adrenal insufficiency  She does have history of depression and insomnia and still has intermittent insomnia Likely  her fatigue is related to sleep disturbances  History of osteopenia associated with lumbar compression fracture  PLAN:  She will increase her levothyroxine to 1-1/2 tablets, new dose of 37.5 mcg Do not think she is a candidate for liothyronine since T3 level is not low and also considering her age  If she is not better in a couple of weeks she is can consider increasing her Zoloft to 50 mg instead of 25 Recommended that she discuss her fatigue and depression medication with her PCP if her fatigue does not improve  Also she will contact her PCP to have follow-up bone density checked and likely needs treatment because of her history of lumbar vertebral fracture; may not be getting accurate bone density measurement of the spine because of spondylosis  Follow-up in about 6 weeks   Elayne Snare 03/31/2020, 2:14 PM   Consultation note copy sent to the PCP  Note: This office note was prepared with Dragon voice recognition system technology. Any transcriptional errors that result from this process are unintentional.

## 2020-04-05 DIAGNOSIS — R69 Illness, unspecified: Secondary | ICD-10-CM | POA: Diagnosis not present

## 2020-04-06 DIAGNOSIS — H524 Presbyopia: Secondary | ICD-10-CM | POA: Diagnosis not present

## 2020-04-21 ENCOUNTER — Other Ambulatory Visit: Payer: Self-pay | Admitting: Internal Medicine

## 2020-04-21 NOTE — Telephone Encounter (Signed)
Refill request received for medication, but not on medication list please advise. Routed to Dr. Lyndel Safe

## 2020-05-02 ENCOUNTER — Ambulatory Visit: Payer: Medicare HMO | Admitting: Family Medicine

## 2020-05-02 ENCOUNTER — Encounter: Payer: Self-pay | Admitting: Family Medicine

## 2020-05-02 ENCOUNTER — Ambulatory Visit (INDEPENDENT_AMBULATORY_CARE_PROVIDER_SITE_OTHER)
Admission: RE | Admit: 2020-05-02 | Discharge: 2020-05-02 | Disposition: A | Discharge status: Home / Self Care | Payer: Medicare HMO | Source: Ambulatory Visit | Attending: Family Medicine | Admitting: Family Medicine

## 2020-05-02 ENCOUNTER — Other Ambulatory Visit: Payer: Self-pay

## 2020-05-02 VITALS — BP 126/82 | HR 78 | Ht 63.0 in | Wt 138.0 lb

## 2020-05-02 DIAGNOSIS — R5383 Other fatigue: Secondary | ICD-10-CM | POA: Diagnosis not present

## 2020-05-02 DIAGNOSIS — Z9889 Other specified postprocedural states: Secondary | ICD-10-CM | POA: Insufficient documentation

## 2020-05-02 DIAGNOSIS — M545 Low back pain, unspecified: Secondary | ICD-10-CM | POA: Diagnosis not present

## 2020-05-02 DIAGNOSIS — Q2112 Patent foramen ovale: Secondary | ICD-10-CM | POA: Insufficient documentation

## 2020-05-02 DIAGNOSIS — M5136 Other intervertebral disc degeneration, lumbar region: Secondary | ICD-10-CM

## 2020-05-02 IMAGING — DX DG LUMBAR SPINE 2-3V
3 series · 3 of 3 positions shown · non-contrast
Comparison: MRI 08/01/2018

CLINICAL DATA: Back pain history of fall

EXAM:
LUMBAR SPINE - 2-3 VIEW

[l-spine ap]
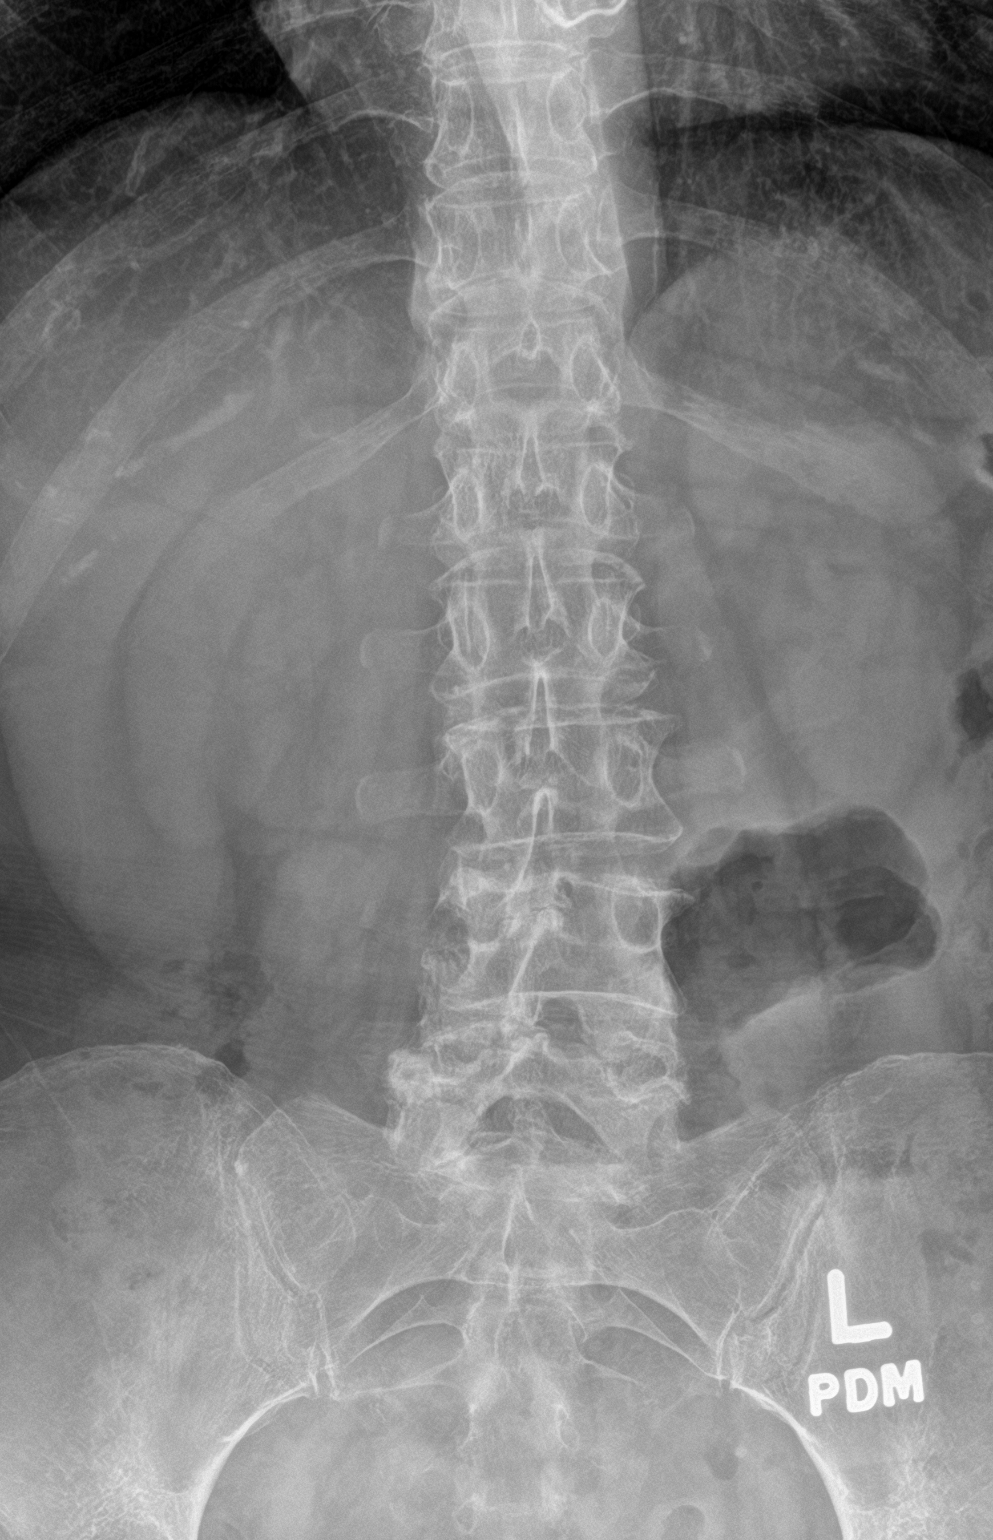

[l-spine lat]
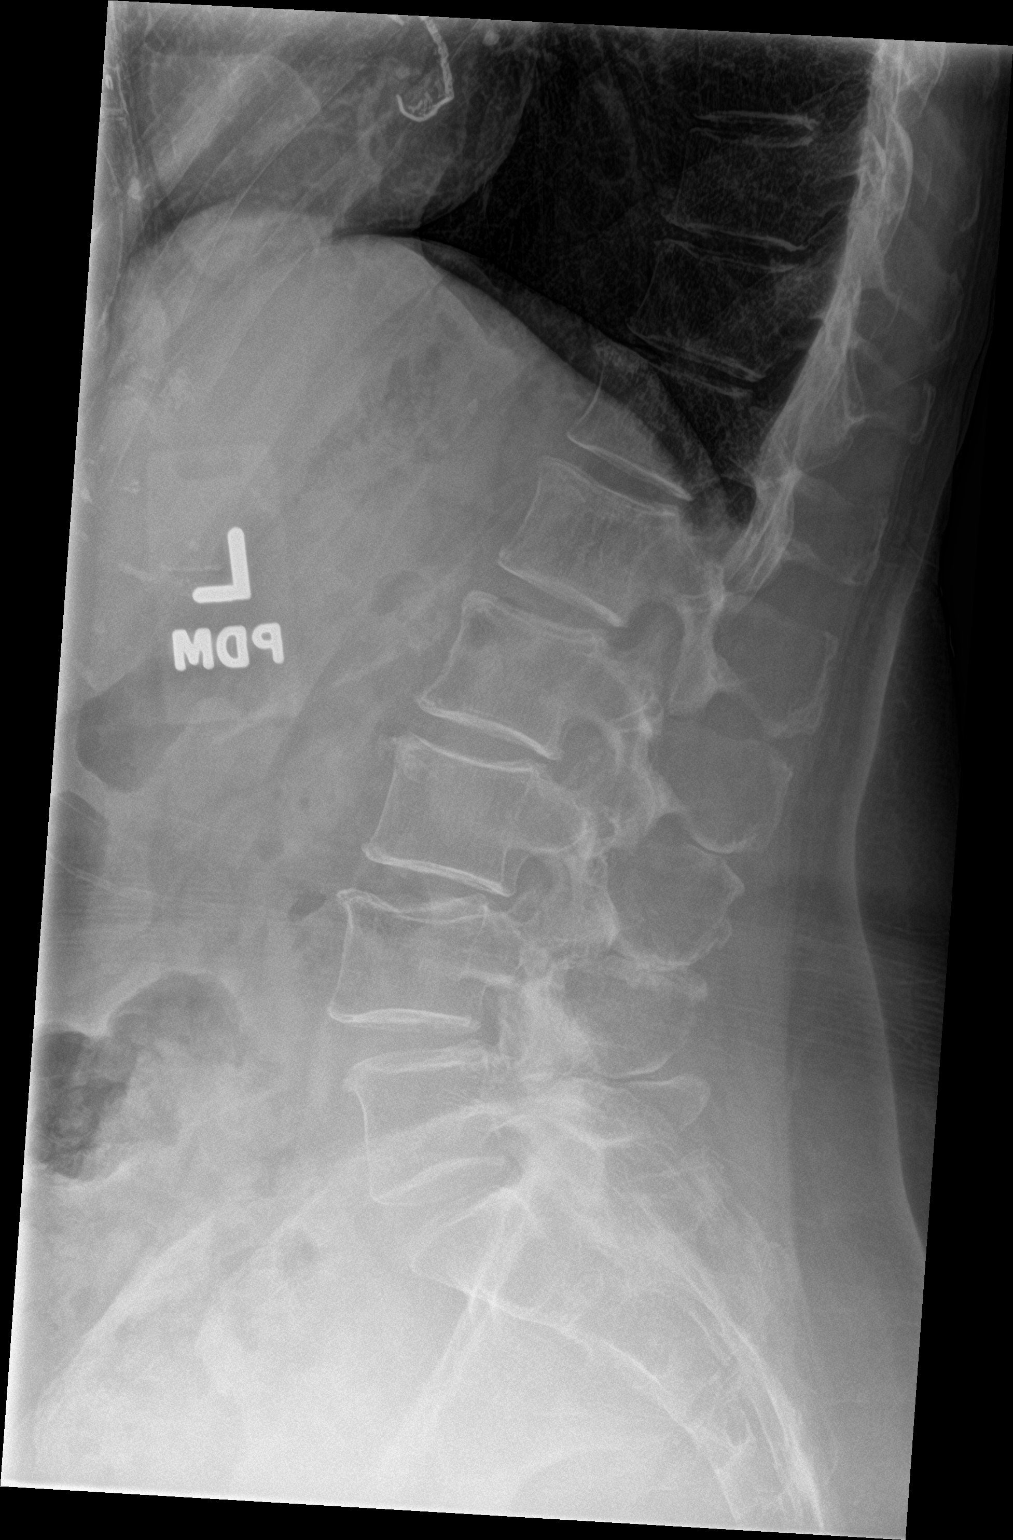

[l-spine spot]
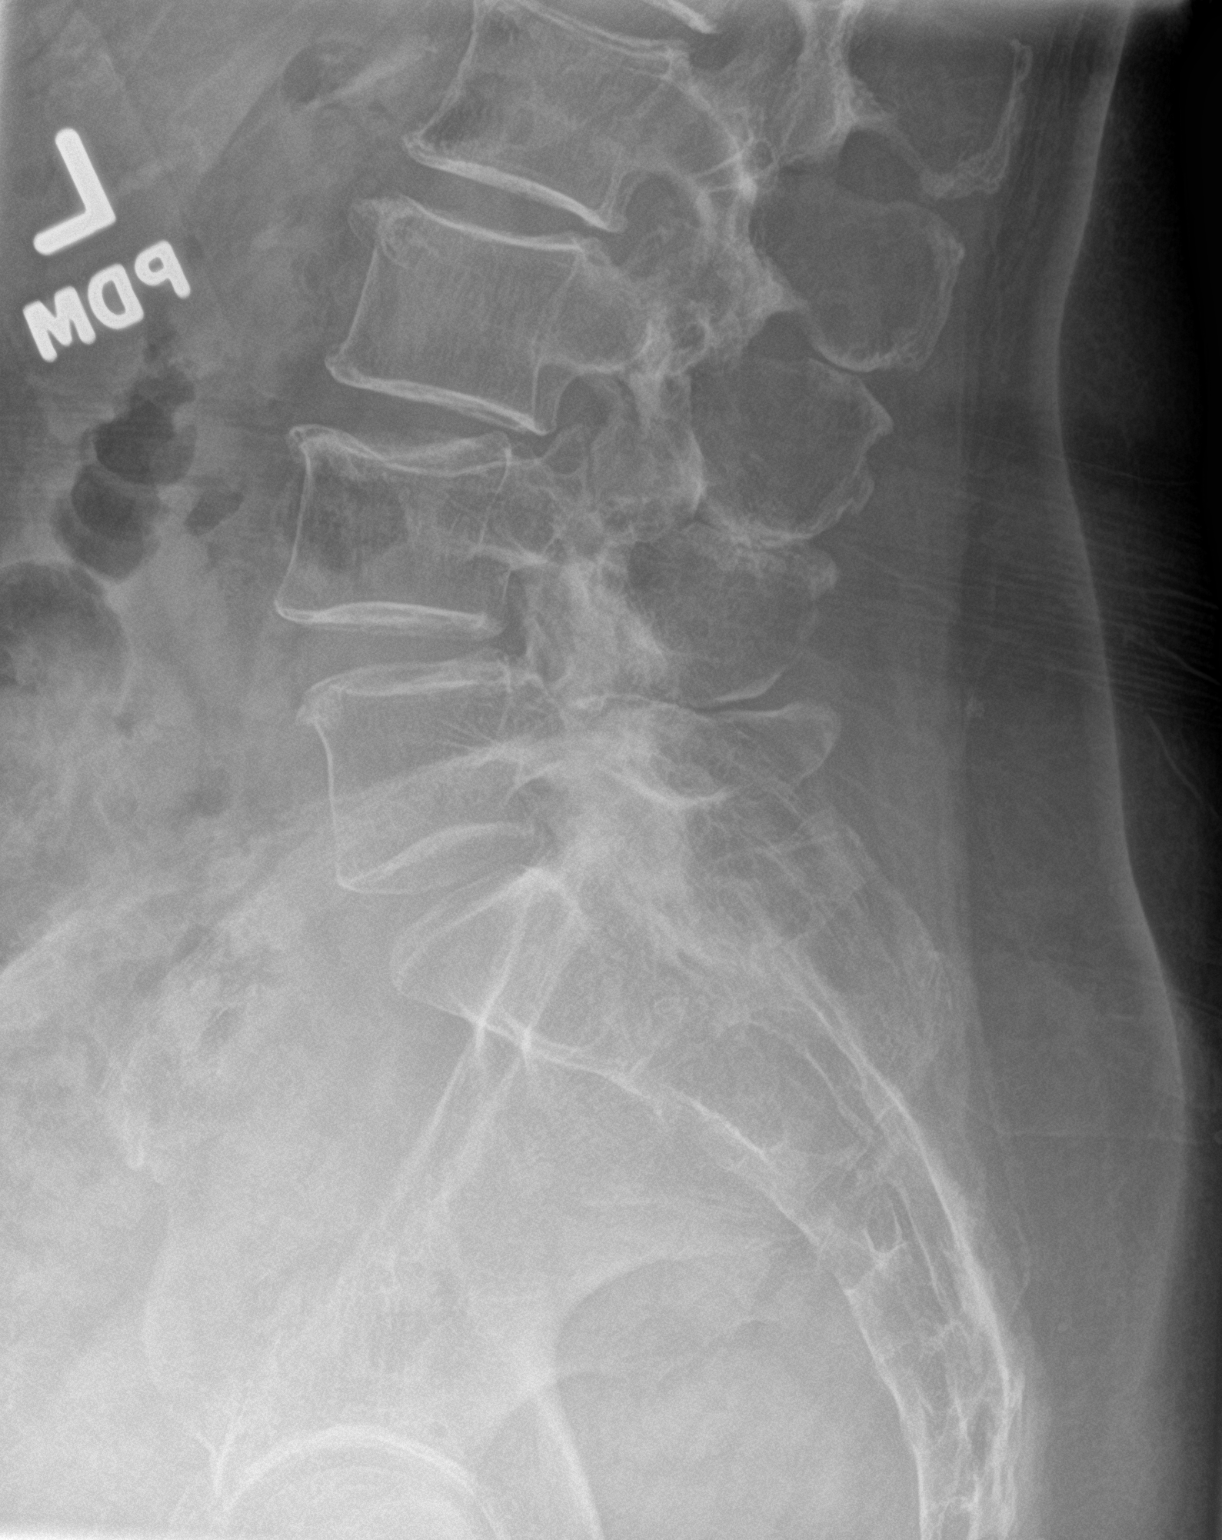

[3 of 3 positions shown; findings below may reference images not displayed]

FINDINGS: Mild levocurvature of the spine. Grade 1 anterolisthesis L4 on L5.
Vertebral body heights are maintained. Mild degenerative changes at
L2-L3, L3-L4 and L4-L5. Facet degenerative changes of the lower
lumbar spine.
IMPRESSION: No acute osseous abnormality. Mild degenerative changes and grade 1
anterolisthesis L4 on L5

## 2020-05-02 MED ORDER — GABAPENTIN 100 MG PO CAPS
100.0000 mg | ORAL_CAPSULE | Freq: Every day | ORAL | 0 refills | Status: DC
Start: 2020-05-02 — End: 2020-11-02

## 2020-05-02 NOTE — Assessment & Plan Note (Signed)
Known degenerative disc disease.  Most recent MRI in January 2020 showed the patient does have L4 and L5 nerve root impingement on the right side that is consistent with patient's symptoms.  Started on a very low dose of gabapentin.  Patient likes to avoid medications if possible.  We discussed icing regimen and home exercises, discussed increasing activity slowly.  We discussed the injections the patient has had previously is also another treatment option.  Patient will increase activity slowly.  Follow-up again in 4 to 8 weeks

## 2020-05-02 NOTE — Progress Notes (Signed)
[facility] Phone: [phone] Subjective:   Kristine [name], am serving as a scribe for Dr. Hulan Saas. This visit occurred during the SARS-CoV-2 public health emergency.  Safety protocols were in place, including screening questions prior to the visit, additional usage of staff PPE, and extensive cleaning of exam room while observing appropriate contact time as indicated for disinfecting solutions.   I'm seeing this patient by the request  of:  Virgie Dad, MD  CC: Low back pain  GHW:[id number]  Kristine [name] is a 79 y.o. female coming in with complaint of low back pain. Patient states that she is having pain in L4/L5 on right side. Pain radiates into glute intermittently. Uses Tylenol for pain. Has had 2 epidural injections that last about 9-10 months. Also exercises for pain relief. Pain with hip flexion.   Patient notes a fall in late September and is now having left sided thoracic spine. Pain beneath bra with breathing.  Reviewed patient's outside messages and notes from other facilities.  Patient did have an MRI of the lumbar spine in January 2020.  Found to have anterior listhesis of L4 on L5 with mild disc bulge and facet arthropathy causing right L4 and L5 nerve root impingement.  Facet hypertrophy at L5-S1. Patient has undergone at least 2 injections with benefit as stated above    Past Medical History:  Diagnosis Date  . Anxiety and depression   . History of repair of mitral valve   . Hypothyroid    Past Surgical History:  Procedure Laterality Date  . ABDOMINAL HYSTERECTOMY  1974   Dr. Etta Grandchild, [place]  . MITRAL VALVE REPAIR  2018   Dr. Lunette Stands, [place]   Social History   Socioeconomic History  . Marital status: Married    Spouse name: Not on file  . Number of children: Not on file  . Years of education: Not on file  .  Highest education level: Associate degree: occupational, Hotel manager, or vocational program  Occupational History  . Not on file  Tobacco Use  . Smoking status: Never Smoker  . Smokeless tobacco: Never Used  Vaping Use  . Vaping Use: Never used  Substance and Sexual Activity  . Alcohol use: Yes    Comment: 1-2 drinks a month   . Drug use: Not on file  . Sexual activity: Not on file  Other Topics Concern  . Not on file  Social History Narrative   Social History      Diet? No       Do you drink/eat things with caffeine? 1/2 cup coffee, tea       Marital status?     Married                               What year were you married? 1965      Do you live in a house, apartment, assisted living, condo, trailer, etc.? Apartment       Is it one or more stories? [name]       How many persons live in your home? 2       Do you have any pets in your home? [name]       Highest level of education completed? 2 years college associates degree      Current or  past profession: [employer], Hospice  Vol Coord/Grief Asst       Do you exercise?         occasionaly                             Type & how often? Walking elliptical before covid       Advanced Directives      Do you have a living will? yes      Do you have a DNR form?                                  If not, do you want to discuss one? No       Do you have signed POA/HPOA for forms? yes      Functional Status      Do you have difficulty bathing or dressing yourself? No       Do you have difficulty preparing food or eating? No       Do you have difficulty managing your medications? No       Do you have difficulty managing your finances? No       Do you have difficulty affording your medications? [name]       Social Determinants of Health   Financial Resource Strain:   . Difficulty of Paying Living Expenses: Not on file  Food Insecurity:   . Worried About Charity fundraiser in the Last Year: Not on file  . Ran  Out of Food in the Last Year: Not on file  Transportation Needs:   . Lack of Transportation (Medical): Not on file  . Lack of Transportation (Non-Medical): Not on file  Physical Activity:   . Days of Exercise per Week: Not on file  . Minutes of Exercise per Session: Not on file  Stress:   . Feeling of Stress : Not on file  Social Connections:   . Frequency of Communication with Friends and Family: Not on file  . Frequency of Social Gatherings with Friends and Family: Not on file  . Attends Religious Services: Not on file  . Active Member of Clubs or Organizations: Not on file  . Attends Archivist Meetings: Not on file  . Marital Status: Not on file   Allergies  Allergen Reactions  . Penicillin G Benzathine & Proc Other (See Comments)    Unknown.   Family History  Problem Relation Age of Onset  . Parkinson's disease Mother   . Dementia Mother   . Prostate cancer Father   . Stroke Sister   . Anxiety disorder Brother   . Depression Brother   . Panic disorder Brother     Current Outpatient Medications (Endocrine & Metabolic):  .  levothyroxine (SYNTHROID) 25 MCG tablet, TAKE 1 TABLET (25 MCG TOTAL) BY MOUTH DAILY AT 0600.  Current Outpatient Medications (Cardiovascular):  .  pravastatin (PRAVACHOL) 10 MG tablet, TAKE 1 TABLET BY MOUTH EVERY DAY   Current Outpatient Medications (Analgesics):  .  aspirin 81 MG chewable tablet, Chew by mouth daily.   Current Outpatient Medications (Other):  Marland Kitchen  Ascorbic Acid (VITAMIN C) 100 MG tablet, Take 100 mg by mouth daily. Marland Kitchen  b complex vitamins tablet, Take 1 tablet by mouth daily. .  calcium-vitamin D (OSCAL WITH D) 250-125 MG-UNIT tablet, Take 1 tablet by mouth daily. Dose unknown-Patient  to find out. .  cephALEXin (KEFLEX) 500 MG capsule, SMARTSIG:4 Capsule(s) By Mouth Once .  cholecalciferol (VITAMIN D3) 25 MCG (1000 UT) tablet, Take 1,000 Units by mouth daily. Marland Kitchen  LORazepam (ATIVAN) 0.5 MG tablet, TAKE 1 TABLET BY MOUTH  AT BEDTIME AS NEEDED FOR ANXIETY. CAN TAKE 1/2 TO ONE PILL AS NEEDED AT NIGHT .  MAGNESIUM GLYCINATE PO, Take 2 capsules by mouth daily.  .  sertraline (ZOLOFT) 50 MG tablet, TAKE 1 TABLET BY MOUTH EVERYDAY AT BEDTIME .  gabapentin (NEURONTIN) 100 MG capsule, Take 1 capsule (100 mg total) by mouth at bedtime.   Reviewed prior external information including notes and imaging from  primary care provider As well as notes that were available from care everywhere and other healthcare systems.  Past medical history, social, surgical and family history all reviewed in electronic medical record.  [name] pertanent information unless stated regarding to the chief complaint.   Review of Systems:  [name] headache, visual changes, nausea, vomiting, diarrhea, constipation, dizziness, abdominal pain, skin rash, fevers, chills, night sweats, weight loss, swollen lymph nodes, body aches, joint swelling, chest pain, shortness of breath, mood changes. POSITIVE muscle aches  Objective  Blood pressure 126/82, pulse 78, height 5\' 3"  (1.6 m), weight 138 lb (62.6 kg), SpO2 99 %.   General: [name] apparent distress alert and oriented x3 mood and affect normal, dressed appropriately.  HEENT: Pupils equal, extraocular movements intact  Respiratory: Patient's speak in full sentences and does not appear short of breath  Cardiovascular: [name] lower extremity edema, non tender, [name] erythema  Neuro: Cranial nerves II through XII are intact, neurovascularly intact in all extremities with 2+ DTRs and 2+ pulses.  Gait normal with good balance and coordination.  MSK: Arthritic changes of multiple joints. Patient's low back does have some mild loss of lordosis, some degenerative scoliosis.  Tender to palpation diffusely.  Patient now has good range of motion with Corky Sox.  Tightness with straight leg test. Patient's left rib cage did not show any type of irregularity.  Patient does not really nontender on exam today.       Impression and  Recommendations:     The above documentation has been reviewed and is accurate and complete Lyndal Pulley, DO

## 2020-05-02 NOTE — Patient Instructions (Signed)
Gabapentin 100mg  Xray lumbar at [address] is ok Turmeric 500mg  daily  Tart cherry extract 1200mg  at night Vitamin D 2000 IU daily  See me again in 6 weeks

## 2020-05-03 DIAGNOSIS — R9431 Abnormal electrocardiogram [ECG] [EKG]: Secondary | ICD-10-CM | POA: Diagnosis not present

## 2020-05-10 ENCOUNTER — Other Ambulatory Visit: Payer: Medicare HMO

## 2020-05-12 ENCOUNTER — Ambulatory Visit: Payer: Medicare HMO | Admitting: Endocrinology

## 2020-05-18 DIAGNOSIS — I340 Nonrheumatic mitral (valve) insufficiency: Secondary | ICD-10-CM | POA: Diagnosis not present

## 2020-05-18 DIAGNOSIS — Z9889 Other specified postprocedural states: Secondary | ICD-10-CM | POA: Diagnosis not present

## 2020-05-18 DIAGNOSIS — I2510 Atherosclerotic heart disease of native coronary artery without angina pectoris: Secondary | ICD-10-CM | POA: Diagnosis not present

## 2020-05-20 ENCOUNTER — Telehealth: Payer: Self-pay

## 2020-05-20 DIAGNOSIS — R001 Bradycardia, unspecified: Secondary | ICD-10-CM | POA: Diagnosis not present

## 2020-05-20 DIAGNOSIS — I517 Cardiomegaly: Secondary | ICD-10-CM | POA: Diagnosis not present

## 2020-05-20 NOTE — Telephone Encounter (Signed)
Patient wanted you to know that she had labs done with [facility] and can send copy if needed. She want to make sure duplicate labs are not ordered.

## 2020-05-25 NOTE — Telephone Encounter (Signed)
Can you tell her to send [place] the labs as I cannot access it through Epic ?

## 2020-05-27 NOTE — Telephone Encounter (Signed)
DWP, she will work on faxing it to [place].

## 2020-05-30 NOTE — Telephone Encounter (Signed)
LMOM for patient to return call to see if she if she has faxed over her results and to see if she wants to come to office for blood work and bring previous lab results with her to prevent duplicate ordering of labs.

## 2020-05-31 ENCOUNTER — Other Ambulatory Visit: Payer: Self-pay

## 2020-05-31 NOTE — Telephone Encounter (Signed)
Opened in error

## 2020-06-02 DIAGNOSIS — I082 Rheumatic disorders of both aortic and tricuspid valves: Secondary | ICD-10-CM | POA: Diagnosis not present

## 2020-06-03 ENCOUNTER — Encounter: Payer: Self-pay | Admitting: Internal Medicine

## 2020-06-03 ENCOUNTER — Non-Acute Institutional Stay: Payer: Medicare HMO | Admitting: Internal Medicine

## 2020-06-03 ENCOUNTER — Other Ambulatory Visit: Payer: Self-pay

## 2020-06-03 VITALS — BP 138/90 | HR 66 | Temp 96.0°F | Ht 63.0 in | Wt 139.2 lb

## 2020-06-03 DIAGNOSIS — Z1231 Encounter for screening mammogram for malignant neoplasm of breast: Secondary | ICD-10-CM

## 2020-06-03 DIAGNOSIS — K644 Residual hemorrhoidal skin tags: Secondary | ICD-10-CM | POA: Diagnosis not present

## 2020-06-03 DIAGNOSIS — M8580 Other specified disorders of bone density and structure, unspecified site: Secondary | ICD-10-CM

## 2020-06-03 DIAGNOSIS — R69 Illness, unspecified: Secondary | ICD-10-CM | POA: Diagnosis not present

## 2020-06-03 DIAGNOSIS — E039 Hypothyroidism, unspecified: Secondary | ICD-10-CM

## 2020-06-03 DIAGNOSIS — F3341 Major depressive disorder, recurrent, in partial remission: Secondary | ICD-10-CM | POA: Diagnosis not present

## 2020-06-03 DIAGNOSIS — E785 Hyperlipidemia, unspecified: Secondary | ICD-10-CM | POA: Diagnosis not present

## 2020-06-03 DIAGNOSIS — Z954 Presence of other heart-valve replacement: Secondary | ICD-10-CM

## 2020-06-03 MED ORDER — HYDROCORTISONE (PERIANAL) 2.5 % EX CREA: 1.0000 "application " | TOPICAL_CREAM | Freq: Two times a day (BID) | CUTANEOUS | 1 refills | Status: DC

## 2020-06-03 NOTE — Progress Notes (Addendum)
Location:  [place] of Service:  Clinic (12)  Provider:   Code Status:  Goals of Care: No flowsheet data found.   Chief Complaint  Patient presents with  . Medical Management of Chronic Issues    Patient returns to the clinic for her 3 month follow up. She would like to discuss her SOB, Fatigue, hemorrhoids actively, sweating with walking and recent echo.   . Health Maintenance    Dexa scan, Hep C screen due.    HPI: Patient is a 79 y.o. female seen today for medical management of chronic diseases.    Patient has history of hypertension, ocular migraine, GERD, hypothyroidism, depression, hyperlipidemia, CKD, S/P mitral Valve repair  Her Active complains today  Hypothyroidism Was seen by Dr. Dwyane Dee and her dose of Synthroid was increased from 25-37.5 to help Her chronic complaints of fatigue Shortness of breath on exertion Patient has a history of mitral valve repair was seen by her cardiologist who has done an echo I do not have the results yet Low back pain  s/p epidural steroid injections Seems to be doing well Depression Patient states that she feels good on Zoloft which was increased but she did not like it and decrease the dose again to 25 mg. Complaining of hemorrhoids Had 1 fall seems more like mechanical fall with no injuries  Past Medical History:  Diagnosis Date  . Anxiety and depression   . History of repair of mitral valve   . Hypothyroid     Past Surgical History:  Procedure Laterality Date  . ABDOMINAL HYSTERECTOMY  1974   Dr. Etta Grandchild, [place]  . MITRAL VALVE REPAIR  2018   Dr. Lunette Stands, [place]    Allergies  Allergen Reactions  . Penicillin G Benzathine & Proc Other (See Comments)    Unknown.    Outpatient Encounter Medications as of 06/03/2020  Medication Sig  . Ascorbic Acid (VITAMIN C) 100 MG tablet Take 100 mg by mouth daily.  Marland Kitchen aspirin 81 MG  chewable tablet Chew by mouth daily.  Marland Kitchen b complex vitamins tablet Take 1 tablet by mouth daily.  . calcium-vitamin D (OSCAL WITH D) 250-125 MG-UNIT tablet Take 1 tablet by mouth daily. Dose unknown-Patient to find out.  . cephALEXin (KEFLEX) 500 MG capsule SMARTSIG:4 Capsule(s) By Mouth Once  . cholecalciferol (VITAMIN D3) 25 MCG (1000 UT) tablet Take 1,000 Units by mouth daily.  Marland Kitchen gabapentin (NEURONTIN) 100 MG capsule Take 1 capsule (100 mg total) by mouth at bedtime.  Marland Kitchen levothyroxine (SYNTHROID) 25 MCG tablet Take 37.5 mcg by mouth daily before breakfast. 1.5 tabs  . LORazepam (ATIVAN) 0.5 MG tablet TAKE 1 TABLET BY MOUTH AT BEDTIME AS NEEDED FOR ANXIETY. CAN TAKE 1/2 TO ONE PILL AS NEEDED AT NIGHT  . MAGNESIUM GLYCINATE PO Take 2 capsules by mouth daily.   . pravastatin (PRAVACHOL) 10 MG tablet TAKE 1 TABLET BY MOUTH EVERY DAY  . sertraline (ZOLOFT) 50 MG tablet Take 25 mg by mouth daily. 1/2 tab  . [DISCONTINUED] sertraline (ZOLOFT) 50 MG tablet TAKE 1 TABLET BY MOUTH EVERYDAY AT BEDTIME (Patient taking differently: Take 25 mg by mouth daily. )  . hydrocortisone (ANUSOL-HC) 2.5 % rectal cream Place 1 application rectally 2 (two) times daily.  . [DISCONTINUED] levothyroxine (SYNTHROID) 25 MCG tablet TAKE 1 TABLET (25 MCG TOTAL) BY MOUTH DAILY AT 0600.   No facility-administered encounter medications on  file as of 06/03/2020.    Review of Systems:  Review of Systems  Constitutional: Positive for activity change.  HENT: Negative.   Respiratory: Positive for shortness of breath.   Cardiovascular: Negative.   Gastrointestinal: Positive for anal bleeding and rectal pain.  Genitourinary: Negative.   Musculoskeletal: Positive for back pain.  Skin: Negative.   Neurological: Positive for weakness.  Psychiatric/Behavioral: Negative.     Health Maintenance  Topic Date Due  . Hepatitis C Screening  Never done  . DEXA SCAN  Never done  . INFLUENZA VACCINE  Never done  . TETANUS/TDAP   08/15/2021  . COVID-19 Vaccine  Completed  . PNA vac Low Risk Adult  Completed    Physical Exam: Vitals:   06/03/20 0949  BP: 138/90  Pulse: 66  Temp: (!) 96 F (35.6 C)  SpO2: 99%  Weight: 139 lb 3.2 oz (63.1 kg)  Height: 5\' 3"  (1.6 m)   Body mass index is 24.66 kg/m. Physical Exam Constitutional: Oriented to person, place, and time. Well-developed and well-nourished.  HENT:  Head: Normocephalic.  Mouth/Throat: Oropharynx is clear and moist.  Eyes: Pupils are equal, round, and reactive to light.  Neck: Neck supple.  Cardiovascular: Normal rate and normal heart sounds.  No murmur heard. Pulmonary/Chest: Effort normal and breath sounds normal. No respiratory distress. No wheezes. She has no rales.  Abdominal: Soft. Bowel sounds are normal. No distension. There is no tenderness. There is no rebound.  Musculoskeletal: No edema.  Rectal Exam Has External Hemorrhoids  Lymphadenopathy: none Neurological: Alert and oriented to person, place, and time.  Gait stable Skin: Skin is warm and dry.  Psychiatric: Normal mood and affect. Behavior is normal. Thought content normal.  Labs reviewed: Basic Metabolic Panel: Recent Labs    06/24/19 1435 09/23/19 1054 02/02/20 0715  NA 141 138 140  K 4.4 4.5 4.7  CL 104 104 104  CO2 25 27 26   GLUCOSE 103* 98 92  BUN 16 13 15   CREATININE 1.05* 0.96* 1.05*  CALCIUM 9.6 9.6 9.2  TSH 5.00* 5.62* 4.03   Liver Function Tests: Recent Labs    06/24/19 1435 02/02/20 0715  AST 20 26  ALT 12 17  BILITOT 0.5 0.7  PROT 7.0 6.8   No results for input(s): LIPASE, AMYLASE in the last 8760 hours. No results for input(s): AMMONIA in the last 8760 hours. CBC: Recent Labs    06/24/19 1435 02/02/20 0715  WBC 4.7 4.7  NEUTROABS 2,308 2,106  HGB 14.9 15.1  HCT 43.5 43.8  MCV 94.2 93.6  PLT 298 270   Lipid Panel: Recent Labs    06/24/19 1435 09/23/19 1054 02/02/20 0715  CHOL 204* 207* 147  HDL 54 64 53  LDLCALC 122* 118* 75   TRIG 166* 140 104  CHOLHDL 3.8 3.2 2.8   No results found for: HGBA1C  Procedures since last visit: No results found.  Assessment/Plan 1. Hypothyroidism, unspecified type Repeat TSH pending on increased dose fo Synthroid Follow up with Dr Dwyane Dee  2. Hyperlipidemia, unspecified hyperlipidemia type On Statin  3. Recurrent major depressive disorder, in partial remission ([place]) Doing well on Lower dose of Zoloft Did not like 50 mg Uses Ativan occassionally for her insomnia  4. Status post Mitral Valve repair Follow up Echo pending Dental Prophylaxis   5. Encounter for screening mammogram for malignant neoplasm of breast  - MM Digital Screening; Future  6. Osteopenia, unspecified location  - DG Bone Density; Future  7. External hemorrhoid,  bleeding Anusol  Chronic bilateral low back pain without sciatica Continue Neurontin 200 mg QHS  Immunization were Up to date in [place] Shingrix also Labs/tests ordered:  * No order type specified * Next appt:  09/15/2020

## 2020-06-14 ENCOUNTER — Other Ambulatory Visit: Payer: Self-pay

## 2020-06-14 ENCOUNTER — Ambulatory Visit: Payer: Medicare HMO | Admitting: Family Medicine

## 2020-06-14 ENCOUNTER — Encounter: Payer: Self-pay | Admitting: Family Medicine

## 2020-06-14 DIAGNOSIS — M5136 Other intervertebral disc degeneration, lumbar region: Secondary | ICD-10-CM

## 2020-06-14 NOTE — Assessment & Plan Note (Signed)
No significant arthritic changes of the back.  Gabapentin 200 mg at night would be encouraged with patient making improvement.  Patient's GFR has been doing very well.  Patient seems to be doing well but does still have signs and symptoms consistent with a nerve root impingement and can potentially respond to a nerve root injection or epidural if necessary.  Patient will continue the home exercises and icing regimen.  Patient will follow up with me again in 2 to 3 months.

## 2020-06-14 NOTE — Progress Notes (Signed)
[place] [address] [place] Phone: [phone] Subjective:   I Kristine [name] am serving as a Education administrator for Dr. Hulan Saas.  This visit occurred during the SARS-CoV-2 public health emergency.  Safety protocols were in place, including screening questions prior to the visit, additional usage of staff PPE, and extensive cleaning of exam room while observing appropriate contact time as indicated for disinfecting solutions.   I'm seeing this patient by the request  of:  Virgie Dad, MD  CC: Low back pain follow-up  [id number]   05/02/2020 Known degenerative disc disease.  Most recent MRI in January 2020 showed the patient does have L4 and L5 nerve root impingement on the right side that is consistent with patient's symptoms.  Started on a very low dose of gabapentin.  Patient likes to avoid medications if possible.  We discussed icing regimen and home exercises, discussed increasing activity slowly.  We discussed the injections the patient has had previously is also another treatment option.  Patient will increase activity slowly.  Follow-up again in 4 to 8 weeks   Update 06/14/2020 Kristine [name] is a 79 y.o. female coming in with complaint of lumbar, DDD. Patient states she is doing well. States she is still in pain.  Patient states that the gabapentin of 100 mg seems to be doing relatively well.  Has noticed that she has been able to do a little bit better during the day and not having as severe pain.  Still very aware of the back pain.  Patient has been doing some of the different vitamin supplementations as well as taking the gabapentin.  Of note patient did have an MRI in 2020 showing that there was an L4 and L5 nerve root impingement on the right side which was consistent with patient's symptoms.  Epidural done in January 2021    Past Medical History:  Diagnosis Date  . Anxiety and depression   . History of repair of mitral  valve   . Hypothyroid    Past Surgical History:  Procedure Laterality Date  . ABDOMINAL HYSTERECTOMY  1974   Dr. Etta Grandchild, [place]  . MITRAL VALVE REPAIR  2018   Dr. Lunette Stands, [place]   Social History   Socioeconomic History  . Marital status: Married    Spouse name: Not on file  . Number of children: Not on file  . Years of education: Not on file  . Highest education level: Associate degree: occupational, Hotel manager, or vocational program  Occupational History  . Not on file  Tobacco Use  . Smoking status: Never Smoker  . Smokeless tobacco: Never Used  Vaping Use  . Vaping Use: Never used  Substance and Sexual Activity  . Alcohol use: Yes    Comment: 1-2 drinks a month   . Drug use: Not on file  . Sexual activity: Not on file  Other Topics Concern  . Not on file  Social History Narrative   Social History      Diet? No       Do you drink/eat things with caffeine? 1/2 cup coffee, tea       Marital status?     Married                               What year were you married? 1965  Do you live in a house, apartment, assisted living, condo, trailer, etc.? Apartment       Is it one or more stories? No       How many persons live in your home? 2       Do you have any pets in your home? No       Highest level of education completed? 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         occasionaly                             Type & how often? Walking elliptical before covid       Advanced Directives      Do you have a living will? yes      Do you have a DNR form?                                  If not, do you want to discuss one? No       Do you have signed POA/HPOA for forms? yes      Functional Status      Do you have difficulty bathing or dressing yourself? No       Do you have  difficulty preparing food or eating? No       Do you have difficulty managing your medications? No       Do you have difficulty managing your finances? No       Do you have difficulty affording your medications? No       Social Determinants of Health   Financial Resource Strain:   . Difficulty of Paying Living Expenses: Not on file  Food Insecurity:   . Worried About Charity fundraiser in the Last Year: Not on file  . Ran Out of Food in the Last Year: Not on file  Transportation Needs:   . Lack of Transportation (Medical): Not on file  . Lack of Transportation (Non-Medical): Not on file  Physical Activity:   . Days of Exercise per Week: Not on file  . Minutes of Exercise per Session: Not on file  Stress:   . Feeling of Stress : Not on file  Social Connections:   . Frequency of Communication with Friends and Family: Not on file  . Frequency of Social Gatherings with Friends and Family: Not on file  . Attends Religious Services: Not on file  . Active Member of Clubs or Organizations: Not on file  . Attends Archivist Meetings: Not on file  . Marital Status: Not on file   Allergies  Allergen Reactions  . Penicillin G Benzathine & Proc Other (See Comments)    Unknown.   Family History  Problem Relation Age of Onset  . Parkinson's disease Mother   . Dementia Mother   . Prostate cancer Father   . Stroke Sister   . Anxiety disorder Brother   . Depression Brother   . Panic disorder Brother     Current Outpatient Medications (Endocrine & Metabolic):  .  levothyroxine (SYNTHROID) 25 MCG tablet, Take 37.5 mcg by mouth daily before breakfast. 1.5 tabs  Current Outpatient Medications (Cardiovascular):  .  pravastatin (PRAVACHOL) 10 MG tablet,  TAKE 1 TABLET BY MOUTH EVERY DAY   Current Outpatient Medications (Analgesics):  .  aspirin 81 MG chewable tablet, Chew by mouth daily.   Current Outpatient Medications (Other):  Marland Kitchen  Ascorbic Acid (VITAMIN C) 100 MG  tablet, Take 100 mg by mouth daily. Marland Kitchen  b complex vitamins tablet, Take 1 tablet by mouth daily. .  calcium-vitamin D (OSCAL WITH D) 250-125 MG-UNIT tablet, Take 1 tablet by mouth daily. Dose unknown-Patient to find out. .  cephALEXin (KEFLEX) 500 MG capsule, SMARTSIG:4 Capsule(s) By Mouth Once .  cholecalciferol (VITAMIN D3) 25 MCG (1000 UT) tablet, Take 1,000 Units by mouth daily. Marland Kitchen  gabapentin (NEURONTIN) 100 MG capsule, Take 1 capsule (100 mg total) by mouth at bedtime. .  hydrocortisone (ANUSOL-HC) 2.5 % rectal cream, Place 1 application rectally 2 (two) times daily. Marland Kitchen  LORazepam (ATIVAN) 0.5 MG tablet, TAKE 1 TABLET BY MOUTH AT BEDTIME AS NEEDED FOR ANXIETY. CAN TAKE 1/2 TO ONE PILL AS NEEDED AT NIGHT .  MAGNESIUM GLYCINATE PO, Take 2 capsules by mouth daily.  .  sertraline (ZOLOFT) 50 MG tablet, Take 25 mg by mouth daily. 1/2 tab   Reviewed prior external information including notes and imaging from  primary care provider As well as notes that were available from care everywhere and other healthcare systems.  Past medical history, social, surgical and family history all reviewed in electronic medical record.  No pertanent information unless stated regarding to the chief complaint.   Review of Systems:  No headache, visual changes, nausea, vomiting, diarrhea, constipation, dizziness, abdominal pain, skin rash, fevers, chills, night sweats, weight loss, swollen lymph nodes, joint swelling, chest pain, shortness of breath, mood changes. POSITIVE muscle aches, body aches  Objective  Blood pressure 140/90, pulse 60, height 5\' 3"  (1.6 m), weight 140 lb (63.5 kg), SpO2 97 %.   General: No apparent distress alert and oriented x3 mood and affect normal, dressed appropriately.  HEENT: Pupils equal, extraocular movements intact  Respiratory: Patient's speak in full sentences and does not appear short of breath  Cardiovascular: No lower extremity edema, non tender, no erythema  Patient has  significant arthritic changes of multiple joints. Low back exam does have some loss of lordosis.  Mild degenerative scoliosis noted.  Patient does still have a mild positive radicular symptoms on the right side.  Patient does have tightness of [place]y Sox on the right greater than left.  Patient has no significant weakness with 4+ out of 5 strength in lower extremities but is symmetric.  No significant atrophy of the lower extremities.    Impression and Recommendations:     The above documentation has been reviewed and is accurate and complete Lyndal Pulley, DO

## 2020-06-14 NOTE — Patient Instructions (Addendum)
Good to see you Increase gabapentin to 200 mg at night Continue exercises I believe you are making improvement Call if you need injections See me again in 8 weeks if needed

## 2020-06-29 DIAGNOSIS — R69 Illness, unspecified: Secondary | ICD-10-CM | POA: Diagnosis not present

## 2020-07-14 ENCOUNTER — Other Ambulatory Visit: Payer: Self-pay | Admitting: Internal Medicine

## 2020-07-14 DIAGNOSIS — F3341 Major depressive disorder, recurrent, in partial remission: Secondary | ICD-10-CM

## 2020-07-14 NOTE — Telephone Encounter (Signed)
Spoke with patient and patient confirmed that she is taking medication as listed on current medication list  50 mg, 1/2 tablet daily for a total of 25 mg

## 2020-07-29 ENCOUNTER — Other Ambulatory Visit: Payer: Self-pay | Admitting: Internal Medicine

## 2020-08-08 NOTE — Progress Notes (Unsigned)
Kristine [name] Phone: [phone] Subjective:   Kristine [name], am serving as a scribe for Dr. Hulan Saas. This visit occurred during the SARS-CoV-2 public health emergency.  Safety protocols were in place, including screening questions prior to the visit, additional usage of staff PPE, and extensive cleaning of exam room while observing appropriate contact time as indicated for disinfecting solutions.   I'm seeing this patient by the request  of:  Virgie Dad, MD  CC: Low back pain follow-up in January onset of neck pain  [id number]   06/14/2020 [name] significant arthritic changes of the back.  Gabapentin 200 mg at night would be encouraged with patient making improvement.  Patient's GFR has been doing very well.  Patient seems to be doing well but does still have signs and symptoms consistent with a nerve root impingement and can potentially respond to a nerve root injection or epidural if necessary.  Patient will continue the home exercises and icing regimen.  Patient will follow up with me again in 2 to 3 months.  Update 08/09/2020 Kristine [name] is a 80 y.o. female coming in with complaint of low back pain. Overall patient feels like she is doing well. Taking 200mg  of gabapentin at night. Pain with sitting across back. Has left leg weakness and pain from sit to stand after she sits for prolonged periods. Pain is in left quad. Right side of lower back is still painful but not as bad as the left.   Also complaining of cervical spine pain. Has been get massages for pain relief.  Patient states that it is more of a tightness.  [name] significant radicular pain down the arms.  Does not notice any significant weakness.  Patient does states it is very uncomfortable.  Patient would like some guidance of what to do to help with some of the discomfort and pain.      Past Medical History:  Diagnosis Date  . Anxiety and  depression   . History of repair of mitral valve   . Hypothyroid    Past Surgical History:  Procedure Laterality Date  . ABDOMINAL HYSTERECTOMY  1974   Dr. Etta Grandchild, [place]  . MITRAL VALVE REPAIR  2018   Dr. Lunette Stands, [place]   Social History   Socioeconomic History  . Marital status: Married    Spouse name: Not on file  . Number of children: Not on file  . Years of education: Not on file  . Highest education level: Associate degree: occupational, Hotel manager, or vocational program  Occupational History  . Not on file  Tobacco Use  . Smoking status: Never Smoker  . Smokeless tobacco: Never Used  Vaping Use  . Vaping Use: Never used  Substance and Sexual Activity  . Alcohol use: Yes    Comment: 1-2 drinks a month   . Drug use: Not on file  . Sexual activity: Not on file  Other Topics Concern  . Not on file  Social History Narrative   Social History      Diet? No       Do you drink/eat things with caffeine? 1/2 cup coffee, tea       Marital status?     Married  What year were you married? 1965      Do you live in a house, apartment, assisted living, condo, trailer, etc.? Apartment       Is it one or more stories? [name]       How many persons live in your home? 2       Do you have any pets in your home? [name]       Highest level of education completed? 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         occasionaly                             Type & how often? Walking elliptical before covid       Advanced Directives      Do you have a living will? yes      Do you have a DNR form?                                  If not, do you want to discuss one? No       Do you have signed POA/HPOA for forms? yes      Functional Status      Do you have difficulty bathing or  dressing yourself? No       Do you have difficulty preparing food or eating? No       Do you have difficulty managing your medications? No       Do you have difficulty managing your finances? No       Do you have difficulty affording your medications? [name]       Social Determinants of Radio broadcast assistant Strain: Not on file  Food Insecurity: Not on file  Transportation Needs: Not on file  Physical Activity: Not on file  Stress: Not on file  Social Connections: Not on file   Allergies  Allergen Reactions  . Penicillin G Benzathine & Proc Other (See Comments)    Unknown.   Family History  Problem Relation Age of Onset  . Parkinson's disease Mother   . Dementia Mother   . Prostate cancer Father   . Stroke Sister   . Anxiety disorder Brother   . Depression Brother   . Panic disorder Brother     Current Outpatient Medications (Endocrine & Metabolic):  .  levothyroxine (SYNTHROID) 25 MCG tablet, Take 37.5 mcg by mouth daily before breakfast. 1.5 tabs  Current Outpatient Medications (Cardiovascular):  .  pravastatin (PRAVACHOL) 10 MG tablet, TAKE 1 TABLET BY MOUTH EVERY DAY   Current Outpatient Medications (Analgesics):  .  aspirin 81 MG chewable tablet, Chew by mouth daily.   Current Outpatient Medications (Other):  Marland Kitchen  Ascorbic Acid (VITAMIN C) 100 MG tablet, Take 100 mg by mouth daily. Marland Kitchen  b complex vitamins tablet, Take 1 tablet by mouth daily. .  calcium-vitamin D (OSCAL WITH D) 250-125 MG-UNIT tablet, Take 1 tablet by mouth daily. Dose unknown-Patient to find out. .  cephALEXin (KEFLEX) 500 MG capsule, SMARTSIG:4 Capsule(s) By Mouth Once .  cholecalciferol (VITAMIN D3) 25 MCG (1000 UT) tablet, Take 1,000 Units by mouth daily. Marland Kitchen  gabapentin (NEURONTIN) 100 MG capsule, Take 1 capsule (100 mg total) by mouth  at bedtime. .  hydrocortisone (ANUSOL-HC) 2.5 % rectal cream, Place 1 application rectally 2 (two) times daily. Marland Kitchen  LORazepam (ATIVAN) 0.5 MG tablet, TAKE 1  TABLET BY MOUTH AT BEDTIME AS NEEDED FOR ANXIETY. CAN TAKE 1/2 TO ONE PILL AS NEEDED AT NIGHT .  MAGNESIUM GLYCINATE PO, Take 2 capsules by mouth daily.  .  sertraline (ZOLOFT) 50 MG tablet, Take 0.5 tablets (25 mg total) by mouth daily.   Reviewed prior external information including notes and imaging from  primary care provider As well as notes that were available from care everywhere and other healthcare systems.  Past medical history, social, surgical and family history all reviewed in electronic medical record.  [name] pertanent information unless stated regarding to the chief complaint.   Review of Systems:  [name] headache, visual changes, nausea, vomiting, diarrhea, constipation, dizziness, abdominal pain, skin rash, fevers, chills, night sweats, weight loss, swollen lymph nodes, body aches, joint swelling, chest pain, shortness of breath, mood changes. POSITIVE muscle aches  Objective  Blood pressure 126/84, pulse 60, height 5\' 3"  (1.6 m), weight 141 lb (64 kg), SpO2 99 %.   General: [name] apparent distress alert and oriented x3 mood and affect normal, dressed appropriately.  HEENT: Pupils equal, extraocular movements intact  Respiratory: Patient's speak in full sentences and does not appear short of breath  Cardiovascular: [name] lower extremity edema, non tender, [name] erythema  MSK: Arthritic changes of multiple joints.  Low back exam still has some very mild tightness with straight leg test but does not have any true radicular symptoms today but does have worsening pain in the lower back.  seems to be worse left greater than right.  Neurovascularly intact distally.  Very mild potential atrophy of the lower extremities bilaterally.  Neck exam does have some loss of lordosis.  Patient lacks last 5 degrees of extension.  Has some very mild scapular dyskinesis left greater than right noted.  Negative Spurling's.  5-5 strength of the upper extremities    Impression and Recommendations:      The above documentation has been reviewed and is accurate and complete Pearlean Brownie, DO

## 2020-08-09 ENCOUNTER — Ambulatory Visit: Payer: Medicare HMO | Admitting: Family Medicine

## 2020-08-09 ENCOUNTER — Other Ambulatory Visit: Payer: Self-pay

## 2020-08-09 ENCOUNTER — Encounter: Payer: Self-pay | Admitting: Family Medicine

## 2020-08-09 ENCOUNTER — Ambulatory Visit (INDEPENDENT_AMBULATORY_CARE_PROVIDER_SITE_OTHER): Payer: Medicare HMO

## 2020-08-09 VITALS — BP 126/84 | HR 60 | Ht 63.0 in | Wt 141.0 lb

## 2020-08-09 DIAGNOSIS — M5416 Radiculopathy, lumbar region: Secondary | ICD-10-CM | POA: Diagnosis not present

## 2020-08-09 DIAGNOSIS — G2589 Other specified extrapyramidal and movement disorders: Secondary | ICD-10-CM | POA: Diagnosis not present

## 2020-08-09 DIAGNOSIS — M542 Cervicalgia: Secondary | ICD-10-CM

## 2020-08-09 DIAGNOSIS — M5030 Other cervical disc degeneration, unspecified cervical region: Secondary | ICD-10-CM | POA: Diagnosis not present

## 2020-08-09 IMAGING — DX DG CERVICAL SPINE 2 OR 3 VIEWS
4 series · 4 of 4 positions shown · non-contrast
Comparison: None.

CLINICAL DATA: Neck pain

EXAM:
CERVICAL SPINE - 2-3 VIEW

[c-spine lat]
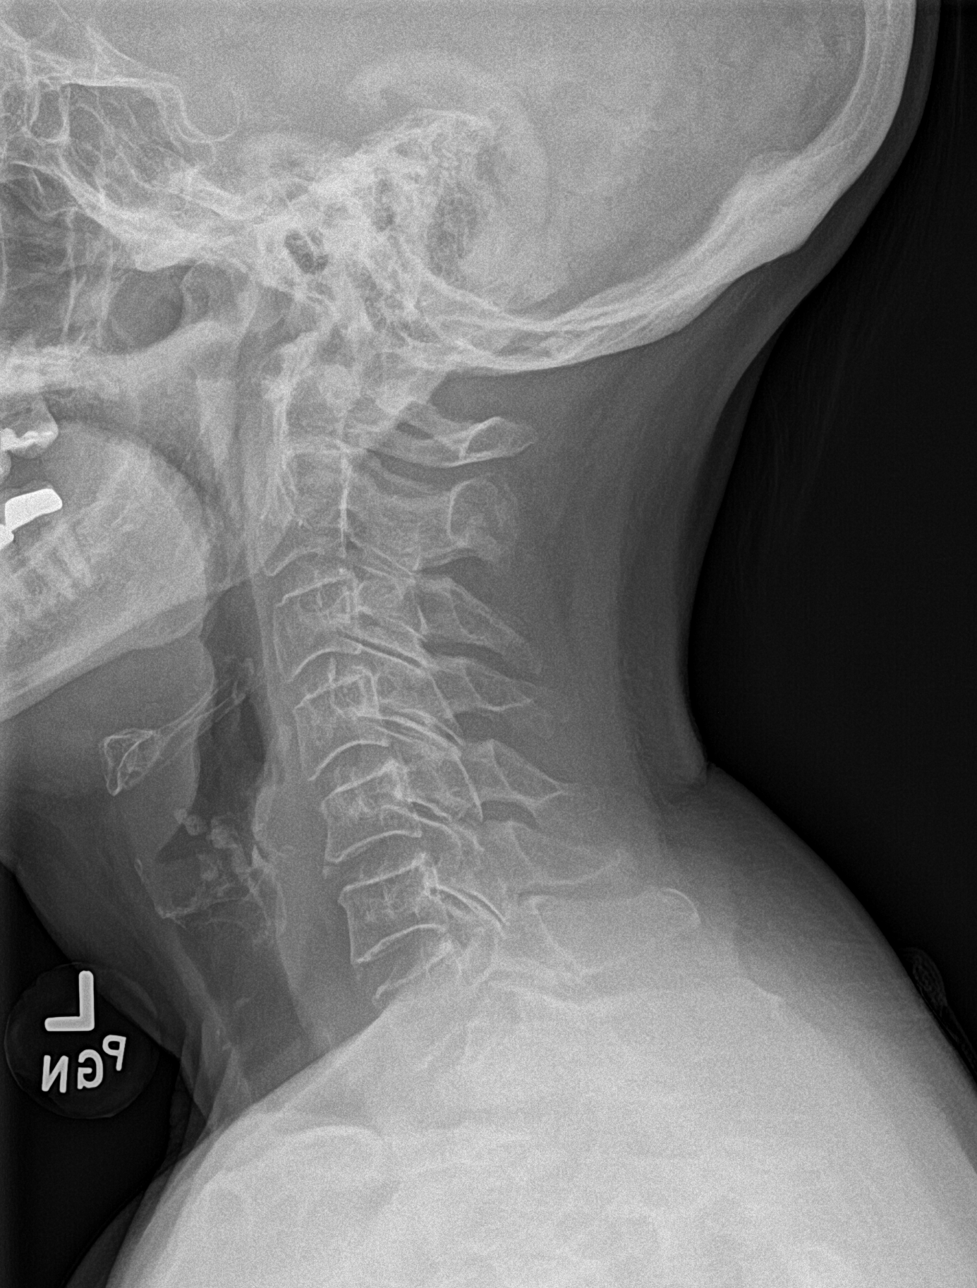

[c-spine ap]
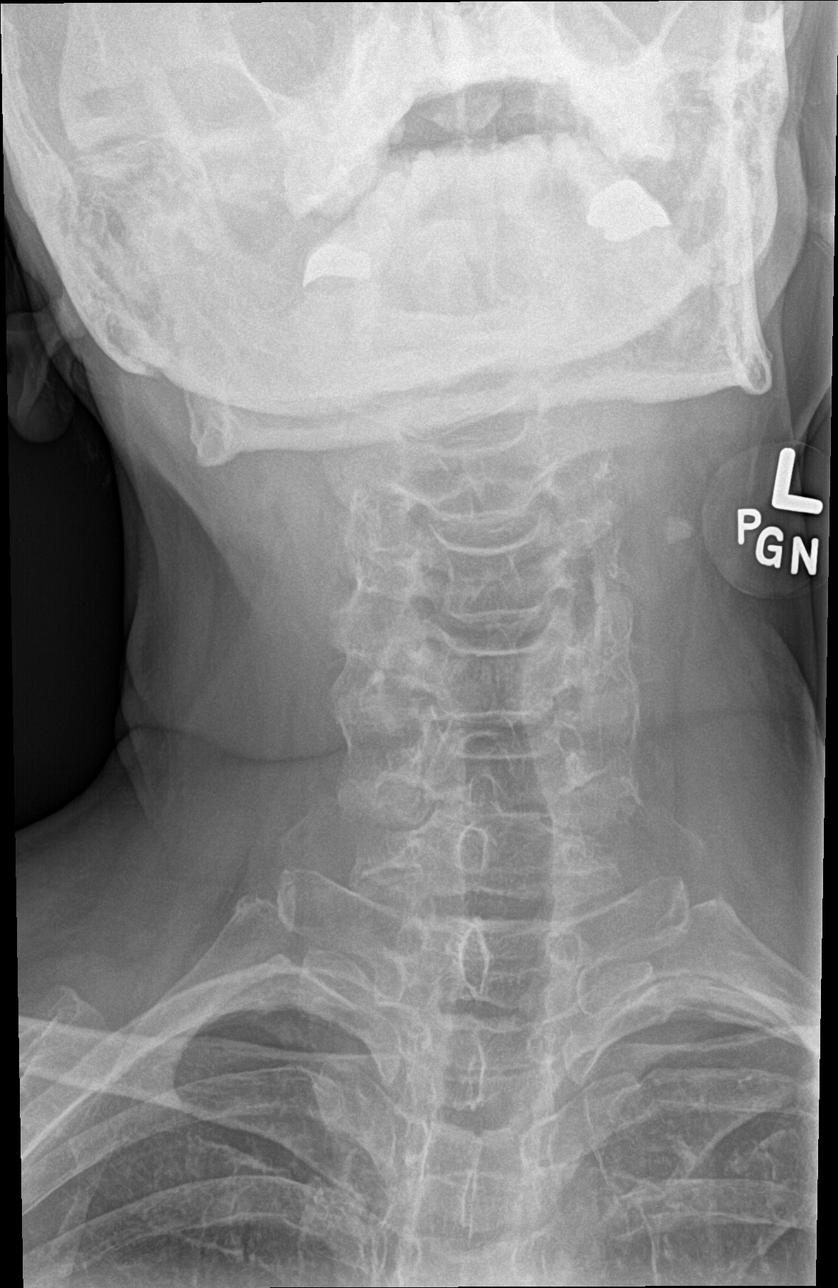

[c-spine open mouth (1 of 2)]
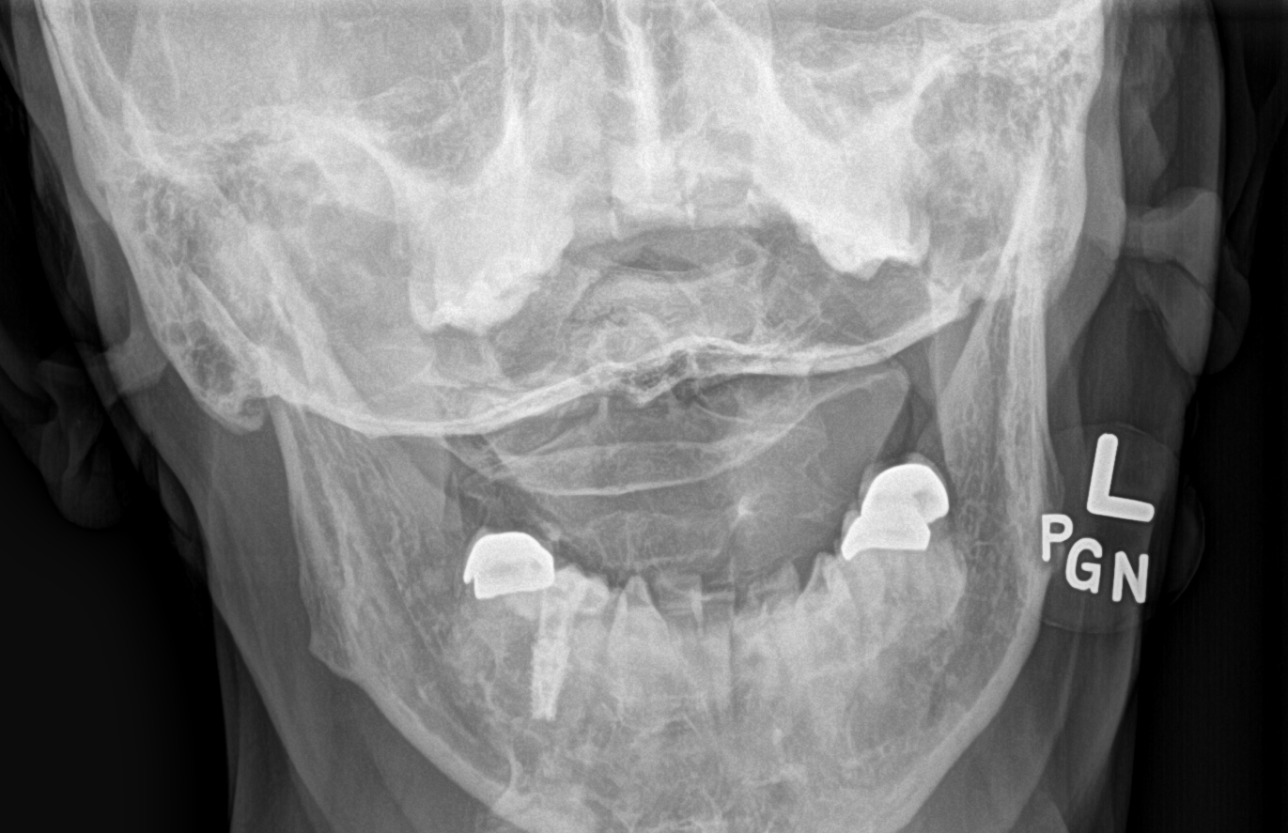

[c-spine open mouth (2 of 2)]
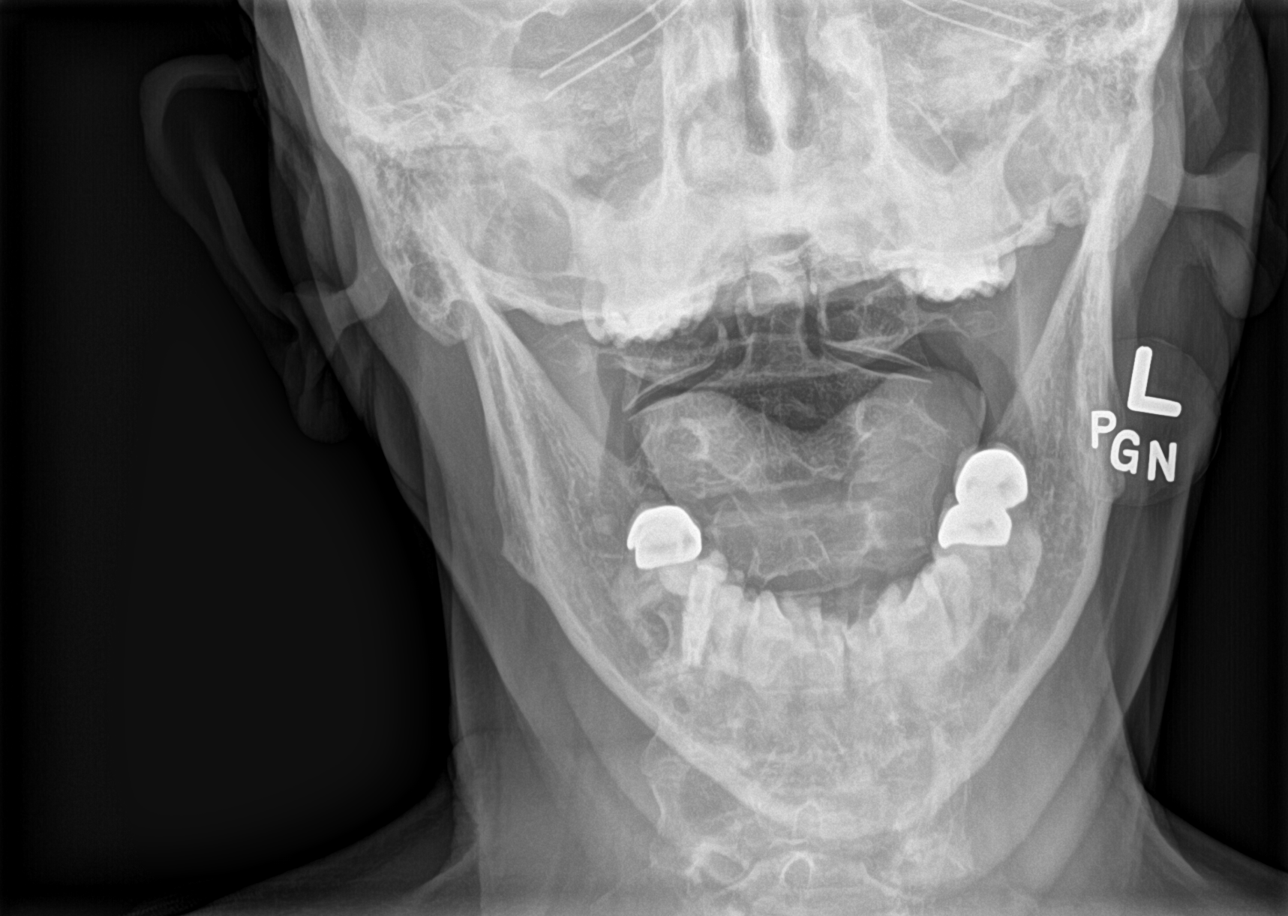

[4 of 4 positions shown; findings below may reference images not displayed]

FINDINGS: Normal alignment. Disc spaces are maintained. Mild bilateral
degenerative facet disease. Prevertebral soft tissues are normal. No
fracture.
IMPRESSION: Degenerative facet disease.  No acute bony abnormality.

## 2020-08-09 NOTE — Patient Instructions (Signed)
Continue gabapentin Xray today See me in 2-3 months

## 2020-08-09 NOTE — Assessment & Plan Note (Signed)
Continues to have intermittent pain that does go down the back.  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  Patient is doing well with the gabapentin.  Patient declined any type of physical therapy or injection today.  Follow-up again for this in 2 to 3 months.

## 2020-08-09 NOTE — Assessment & Plan Note (Signed)
Questionable cervical degenerative disc disease could also be contributing.  X-rays ordered today.  Exercises given.  Patient is encouraged to do this 3 times a week.  Increase activity slowly.  Patient is having no true radicular symptoms.  Has had a history of migraines but states that this is a little different.  Follow-up with me again 2 to 3 months

## 2020-08-11 ENCOUNTER — Other Ambulatory Visit: Payer: Self-pay | Admitting: Internal Medicine

## 2020-08-11 NOTE — Telephone Encounter (Signed)
Patient requested refill. Stated that Dr. Dwyane Dee increased dosage of Levothyroxine to One and 1/2 tablet daily.   Pended Rx and sent to Dr. Lyndel Safe for approval.

## 2020-08-16 ENCOUNTER — Ambulatory Visit: Payer: Medicare HMO | Admitting: Nurse Practitioner

## 2020-08-24 DIAGNOSIS — R0600 Dyspnea, unspecified: Secondary | ICD-10-CM | POA: Diagnosis not present

## 2020-08-24 DIAGNOSIS — I340 Nonrheumatic mitral (valve) insufficiency: Secondary | ICD-10-CM | POA: Diagnosis not present

## 2020-08-24 DIAGNOSIS — Z9889 Other specified postprocedural states: Secondary | ICD-10-CM | POA: Diagnosis not present

## 2020-08-24 DIAGNOSIS — Z952 Presence of prosthetic heart valve: Secondary | ICD-10-CM | POA: Diagnosis not present

## 2020-09-15 ENCOUNTER — Other Ambulatory Visit: Payer: Self-pay

## 2020-09-15 DIAGNOSIS — E785 Hyperlipidemia, unspecified: Secondary | ICD-10-CM

## 2020-09-15 DIAGNOSIS — M8580 Other specified disorders of bone density and structure, unspecified site: Secondary | ICD-10-CM

## 2020-09-19 ENCOUNTER — Other Ambulatory Visit: Payer: Self-pay | Admitting: Internal Medicine

## 2020-09-19 ENCOUNTER — Telehealth: Payer: Self-pay

## 2020-09-19 DIAGNOSIS — M8580 Other specified disorders of bone density and structure, unspecified site: Secondary | ICD-10-CM

## 2020-09-19 NOTE — Telephone Encounter (Signed)
Patient called stating her bone density had to be postpone to Wednesday per the office.Sne was told if she has it done in [place] Dr. Lyndel Safe would not be able to compare it to the one she had done at [facility] in [place]. Is this true?  [phone] cell   Also, lab need orders for tomorrow.  To [facility] as Dr. Lyndel Safe is on vacation.

## 2020-09-19 NOTE — Telephone Encounter (Signed)
Previous scan from 2019 is in [place].

## 2020-09-19 NOTE — Telephone Encounter (Signed)
Confirmed we are getting results from [facility] for [facility].  Called patient. Patient has decided to have bone density done in [place].

## 2020-09-20 ENCOUNTER — Other Ambulatory Visit: Payer: Medicare HMO

## 2020-09-20 ENCOUNTER — Ambulatory Visit: Payer: Medicare HMO

## 2020-09-21 ENCOUNTER — Other Ambulatory Visit: Payer: Self-pay

## 2020-09-21 ENCOUNTER — Ambulatory Visit
Admission: RE | Admit: 2020-09-21 | Discharge: 2020-09-21 | Disposition: A | Discharge status: Home / Self Care | Payer: Medicare HMO | Source: Ambulatory Visit | Attending: Internal Medicine | Admitting: Internal Medicine

## 2020-09-21 DIAGNOSIS — M8589 Other specified disorders of bone density and structure, multiple sites: Secondary | ICD-10-CM | POA: Diagnosis not present

## 2020-09-21 DIAGNOSIS — M8580 Other specified disorders of bone density and structure, unspecified site: Secondary | ICD-10-CM

## 2020-09-21 DIAGNOSIS — Z780 Asymptomatic menopausal state: Secondary | ICD-10-CM | POA: Diagnosis not present

## 2020-09-22 ENCOUNTER — Encounter: Payer: Self-pay | Admitting: Nurse Practitioner

## 2020-10-03 ENCOUNTER — Other Ambulatory Visit: Payer: Self-pay | Admitting: Nurse Practitioner

## 2020-10-03 DIAGNOSIS — N1831 Chronic kidney disease, stage 3a: Secondary | ICD-10-CM

## 2020-10-03 DIAGNOSIS — E785 Hyperlipidemia, unspecified: Secondary | ICD-10-CM

## 2020-10-03 DIAGNOSIS — K219 Gastro-esophageal reflux disease without esophagitis: Secondary | ICD-10-CM

## 2020-10-03 DIAGNOSIS — I129 Hypertensive chronic kidney disease with stage 1 through stage 4 chronic kidney disease, or unspecified chronic kidney disease: Secondary | ICD-10-CM

## 2020-10-03 DIAGNOSIS — E039 Hypothyroidism, unspecified: Secondary | ICD-10-CM

## 2020-10-04 DIAGNOSIS — E785 Hyperlipidemia, unspecified: Secondary | ICD-10-CM | POA: Diagnosis not present

## 2020-10-04 DIAGNOSIS — K219 Gastro-esophageal reflux disease without esophagitis: Secondary | ICD-10-CM

## 2020-10-04 DIAGNOSIS — E039 Hypothyroidism, unspecified: Secondary | ICD-10-CM

## 2020-10-04 DIAGNOSIS — I129 Hypertensive chronic kidney disease with stage 1 through stage 4 chronic kidney disease, or unspecified chronic kidney disease: Secondary | ICD-10-CM | POA: Diagnosis not present

## 2020-10-04 DIAGNOSIS — N1831 Chronic kidney disease, stage 3a: Secondary | ICD-10-CM

## 2020-10-05 LAB — CBC WITH DIFFERENTIAL/PLATELET
Absolute Monocytes: 470 cells/uL (ref 200–950)
Basophils Absolute: 20 cells/uL (ref 0–200)
Basophils Relative: 0.4 %
Eosinophils Absolute: 78 cells/uL (ref 15–500)
Eosinophils Relative: 1.6 %
HCT: 46.1 % — ABNORMAL HIGH (ref 35.0–45.0)
Hemoglobin: 15.5 g/dL (ref 11.7–15.5)
Lymphs Abs: 2127 cells/uL (ref 850–3900)
MCH: 31.8 pg (ref 27.0–33.0)
MCHC: 33.6 g/dL (ref 32.0–36.0)
MCV: 94.7 fL (ref 80.0–100.0)
MPV: 10.1 fL (ref 7.5–12.5)
Monocytes Relative: 9.6 %
Neutro Abs: 2205 cells/uL (ref 1500–7800)
Neutrophils Relative %: 45 %
Platelets: 293 10*3/uL (ref 140–400)
RBC: 4.87 10*6/uL (ref 3.80–5.10)
RDW: 12.1 % (ref 11.0–15.0)
Total Lymphocyte: 43.4 %
WBC: 4.9 10*3/uL (ref 3.8–10.8)

## 2020-10-05 LAB — LIPID PANEL
Cholesterol: 179 mg/dL (ref <200)
HDL: 60 mg/dL (ref >=50)
LDL Cholesterol (Calc): 95 mg/dL (calc)
Non-HDL Cholesterol (Calc): 119 mg/dL (calc) (ref <130)
Total CHOL/HDL Ratio: 3 (calc) (ref <5.0)
Triglycerides: 139 mg/dL (ref <150)

## 2020-10-05 LAB — COMPLETE METABOLIC PANEL WITH GFR
AG Ratio: 1.6 (calc) (ref 1.0–2.5)
ALT: 13 U/L (ref 6–29)
AST: 20 U/L (ref 10–35)
Albumin: 4.3 g/dL (ref 3.6–5.1)
Alkaline phosphatase (APISO): 61 U/L (ref 37–153)
BUN/Creatinine Ratio: 19 (calc) (ref 6–22)
BUN: 18 mg/dL (ref 7–25)
CO2: 28 mmol/L (ref 20–32)
Calcium: 9.4 mg/dL (ref 8.6–10.4)
Chloride: 105 mmol/L (ref 98–110)
Creat: 0.96 mg/dL — ABNORMAL HIGH (ref 0.60–0.93)
GFR, Est African American: 66 mL/min/{1.73_m2} (ref >=60)
GFR, Est Non African American: 57 mL/min/{1.73_m2} — ABNORMAL LOW (ref >=60)
Globulin: 2.7 g/dL (calc) (ref 1.9–3.7)
Glucose, Bld: 80 mg/dL (ref 65–99)
Potassium: 4.9 mmol/L (ref 3.5–5.3)
Sodium: 141 mmol/L (ref 135–146)
Total Bilirubin: 0.6 mg/dL (ref 0.2–1.2)
Total Protein: 7 g/dL (ref 6.1–8.1)

## 2020-10-05 LAB — TSH: TSH: 2.24 mIU/L (ref 0.40–4.50)

## 2020-10-06 ENCOUNTER — Other Ambulatory Visit: Payer: Self-pay

## 2020-10-06 ENCOUNTER — Encounter: Payer: Self-pay | Admitting: Nurse Practitioner

## 2020-10-06 ENCOUNTER — Non-Acute Institutional Stay: Payer: Medicare HMO | Admitting: Nurse Practitioner

## 2020-10-06 DIAGNOSIS — M47816 Spondylosis without myelopathy or radiculopathy, lumbar region: Secondary | ICD-10-CM | POA: Diagnosis not present

## 2020-10-06 DIAGNOSIS — G43801 Other migraine, not intractable, with status migrainosus: Secondary | ICD-10-CM | POA: Diagnosis not present

## 2020-10-06 DIAGNOSIS — I129 Hypertensive chronic kidney disease with stage 1 through stage 4 chronic kidney disease, or unspecified chronic kidney disease: Secondary | ICD-10-CM | POA: Diagnosis not present

## 2020-10-06 DIAGNOSIS — Z954 Presence of other heart-valve replacement: Secondary | ICD-10-CM

## 2020-10-06 DIAGNOSIS — E039 Hypothyroidism, unspecified: Secondary | ICD-10-CM | POA: Diagnosis not present

## 2020-10-06 DIAGNOSIS — E785 Hyperlipidemia, unspecified: Secondary | ICD-10-CM

## 2020-10-06 DIAGNOSIS — K219 Gastro-esophageal reflux disease without esophagitis: Secondary | ICD-10-CM | POA: Diagnosis not present

## 2020-10-06 DIAGNOSIS — M5136 Other intervertebral disc degeneration, lumbar region: Secondary | ICD-10-CM | POA: Diagnosis not present

## 2020-10-06 DIAGNOSIS — M8580 Other specified disorders of bone density and structure, unspecified site: Secondary | ICD-10-CM | POA: Diagnosis not present

## 2020-10-06 DIAGNOSIS — N1831 Chronic kidney disease, stage 3a: Secondary | ICD-10-CM | POA: Diagnosis not present

## 2020-10-06 DIAGNOSIS — K644 Residual hemorrhoidal skin tags: Secondary | ICD-10-CM

## 2020-10-06 NOTE — Assessment & Plan Note (Signed)
Depression/anxieyt, prn Lorazepam, Sertraline. Declined depression.

## 2020-10-06 NOTE — Assessment & Plan Note (Signed)
diet controlled, Hgb 15.5 10/04/20

## 2020-10-06 NOTE — Assessment & Plan Note (Signed)
S/p Mitral valve repair

## 2020-10-06 NOTE — Assessment & Plan Note (Signed)
stable °

## 2020-10-06 NOTE — Assessment & Plan Note (Signed)
no recent fx.

## 2020-10-06 NOTE — Assessment & Plan Note (Signed)
Bun/creat 18/0.96 eGFR 57 10/04/20

## 2020-10-06 NOTE — Assessment & Plan Note (Signed)
LDL 95 10/04/20, takes Pravastatin

## 2020-10-06 NOTE — Progress Notes (Addendum)
Location:   clinic [place]   Place of Service:  Clinic (12) Provider: Marlana Latus NP  Code Status: DNR Goals of Care: No flowsheet data found.   Chief Complaint  Patient presents with  . Medical Management of Chronic Issues    Patient returns to the clinic for follow up. She has a number of concerns she would like addressed.     HPI: Patient is a 80 y.o. female seen today for medical management of chronic diseases.       HTN ASA 27m qd, Bun/creat 18/0.96 10/04/20             Ocular migraine stable.              Hypothyroidism Levothyroxine, TSH 2.24 10/04/20             Depression/anxieyt, prn Lorazepam, Sertraline. Declined depression.              CKD Bun/creat 18/0.96 eGFR 57 10/04/20             S/p Mitral valve repair             Chronic lower back pain, inj 07/2019, Gabapentin, ? Restless legs, nighmares             Hyperlipidemia, LDL 95 10/04/20, takes Pravastatin   Osteopenia, no recent fx.                 Past Medical History:  Diagnosis Date  . Anxiety and depression   . History of repair of mitral valve   . Hypothyroid     Past Surgical History:  Procedure Laterality Date  . ABDOMINAL HYSTERECTOMY  1974   [place] . MITRAL VALVE REPA[place]  2018   [place]    Allergies  Allergen Reactions  . Penicillin G Benzathine & Proc Other (See Comments)    Unknown.    Allergies as of 10/06/2020      Reactions   Penicillin G Benzathine & Proc Other (See Comments)   Unknown.      Medication List       Accurate as of October 06, 2020 11:59 PM. If you have any questions, ask your nurse or doctor.        aspirin 81 MG chewable tablet Chew by mouth daily.   b complex vitamins tablet Take 1 tablet by mouth daily.   calcium-vitamin D 250-125 MG-UNIT tablet Commonly known as: OSCAL WITH D Take 1 tablet by mouth daily. Dose unknown-Patient to find out.   cephALEXin 500  MG capsule Commonly known as: KEFLEX Prior the dental visits.   cholecalciferol 25 MCG (1000 UNIT) tablet Commonly known as: VITAMIN D3 Take 1,000 Units by mouth daily.   gabapentin 100 MG capsule Commonly known as: NEURONTIN Take 1 capsule (100 mg total) by mouth at bedtime. What changed: how much to take   hydrocortisone 2.5 % rectal cream Commonly known as: Anusol-HC Place 1 application rectally 2 (two) times daily.   levothyroxine 25 MCG tablet Commonly known as: SYNTHROID Take One and half tablet (37.544m) by mouth once daily before breakfast on empty stomach 30 minutes before breakfast.   LORazepam 0.5 MG tablet Commonly known as: ATIVAN TAKE 1 TABLET BY MOUTH AT BEDTIME AS NEEDED FOR ANXIETY. CAN TAKE 1/2 TO ONE PILL AS NEEDED AT NIGHT   MAGNESIUM GLYCINATE PO Take 2 capsules by mouth daily.  pravastatin 10 MG tablet Commonly known as: PRAVACHOL TAKE 1 TABLET BY MOUTH EVERY DAY   sertraline 50 MG tablet Commonly known as: ZOLOFT Take 0.5 tablets (25 mg total) by mouth daily.   vitamin C 100 MG tablet Take 100 mg by mouth daily.       Review of Systems:  Review of Systems  Constitutional: Negative for fatigue, fever and unexpected weight change.  HENT: Negative for congestion and voice change.   Eyes: Negative for visual disturbance.       Dry eyes  Respiratory: Negative for cough, shortness of breath and wheezing.   Gastrointestinal: Negative for abdominal pain, constipation, nausea and vomiting.       Hemorrhoidal external, blood on tissue and pain  Genitourinary: Negative for difficulty urinating, dysuria and frequency.  Musculoskeletal: Positive for back pain.       No pain presently.   Skin: Negative for color change.  Neurological: Negative for dizziness, speech difficulty and weakness.  Psychiatric/Behavioral: Positive for sleep disturbance. Negative for behavioral problems and confusion. The patient is not nervous/anxious.        Occasional      Health Maintenance  Topic Date Due  . Hepatitis C Screening  Never done  . DEXA SCAN  Never done  . COVID-19 Vaccine (3 - Moderna risk 4-dose series) 09/14/2019  . INFLUENZA VACCINE  Never done  . TETANUS/TDAP  08/15/2021  . PNA vac Low Risk Adult  Completed  . HPV VACCINES  Aged Out    Physical Exam: Vitals:   10/06/20 1537  BP: 122/84  Pulse: (!) 58  Temp: (!) 97.2 F (36.2 C)  SpO2: 100%  Weight: 140 lb 6.4 oz (63.7 kg)  Height: 5' 3"  (1.6 m)   Body mass index is 24.87 kg/m. Physical Exam Vitals and nursing note reviewed.  Constitutional:      Appearance: Normal appearance.  HENT:     Head: Normocephalic and atraumatic.     Nose: Nose normal.     Mouth/Throat:     Mouth: Mucous membranes are moist.  Eyes:     Conjunctiva/sclera: Conjunctivae normal.     Pupils: Pupils are equal, round, and reactive to light.     Comments: dry eyes  Cardiovascular:     Rate and Rhythm: Normal rate and regular rhythm.     Heart sounds: No murmur heard.   Pulmonary:     Effort: Pulmonary effort is normal.     Breath sounds: No rales.  Abdominal:     General: Bowel sounds are normal.     Palpations: Abdomen is soft.     Tenderness: There is no abdominal tenderness.  Genitourinary:    Comments: Hemorrhoids external. Breast exam  Musculoskeletal:     Cervical back: Normal range of motion and neck supple.     Right lower leg: No edema.     Left lower leg: No edema.  Skin:    General: Skin is warm and dry.  Neurological:     General: No focal deficit present.     Mental Status: She is alert and oriented to person, place, and time. Mental status is at baseline.     Gait: Gait normal.  Psychiatric:        Mood and Affect: Mood normal.        Behavior: Behavior normal.        Thought Content: Thought content normal.        Judgment: Judgment normal.     Labs reviewed:  Basic Metabolic Panel: Recent Labs    02/02/20 0715 10/04/20 0740  NA 140 141  K 4.7 4.9  CL  104 105  CO2 26 28  GLUCOSE 92 80  BUN 15 18  CREATININE 1.05* 0.96*  CALCIUM 9.2 9.4  TSH 4.03 2.24   Liver Function Tests: Recent Labs    02/02/20 0715 10/04/20 0740  AST 26 20  ALT 17 13  BILITOT 0.7 0.6  PROT 6.8 7.0   No results for input(s): LIPASE, AMYLASE in the last 8760 hours. No results for input(s): AMMONIA in the last 8760 hours. CBC: Recent Labs    02/02/20 0715 10/04/20 0740  WBC 4.7 4.9  NEUTROABS 2,106 2,205  HGB 15.1 15.5  HCT 43.8 46.1*  MCV 93.6 94.7  PLT 270 293   Lipid Panel: Recent Labs    02/02/20 0715 10/04/20 0740  CHOL 147 179  HDL 53 60  LDLCALC 75 95  TRIG 104 139  CHOLHDL 2.8 3.0   No results found for: HGBA1C  Procedures since last visit: DG MOBILE BONE DENSITY  Result Date: 09/22/2020 CLINICAL DATA:  80 year old postmenopausal Caucasian female. Personal history of finger fracture as an adult. Family history of hip fracture in mother. Current history of hypothyroidism. Currently undergoing calcium and vitamin-D supplementation. New baseline examination. EXAM: DUAL X-RAY ABSORPTIOMETRY (DXA) FOR BONE MINERAL DENSITY TECHNIQUE: Bone mineral density measurements are performed of the spine, hip, and forearm, as appropriate, per International Society of Clinical Densitometry recommendations. The pertinent regions of interest are reported below. Non-contributory values are not reported. Images are obtained for bone mineral density measurement and are not obtained for diagnostic purposes. FINDINGS: AP LUMBAR SPINE (L1-L3) Bone Mineral Density (BMD):  0.868 g/cm2 Young Adult T-Score:  -1.4 Z-Score:  1.1 LEFT FEMUR NECK Bone Mineral Density (BMD):  0.598 g/cm2 Young Adult T-Score: -2.3 Z-Score:  -0.1 Unit: This study was performed at [facility] on the [place] (S/N [phone]), software version 13.4.2. Scan quality: The scan quality is good. Exclusions: L4 (as it had a T-score greater than 1 standard deviation higher than that at  L3). ASSESSMENT: Patient's diagnostic category is LOW BONE MASS/OSTEOPENIA by [facility] Criteria. FRACTURE RISK: INCREASED. FRAX: Based on the [place], the 10 year probability of a major osteoporotic fracture is 39%. The 10 year probability of a hip fracture is 25%. COMPARISON: None. The patient's prior examinations were performed at a different site on a different machine and therefore cannot be compared. RECOMMENDATIONS 1. All patients should optimize calcium and vitamin D intake. 2. Consider FDA-approved medical therapies in postmenopausal women and men aged 21 years and older, based on the following: - A hip or vertebral (clinical or morphometric) fracture - T-score less than or equal to -2.5 at the femoral neck or spine after appropriate evaluation to exclude secondary causes - Low bone mass (T-score between -1.0 and -2.5 at the femoral neck or spine) and a 10-year probability of a hip fracture greater than or equal to 3% or a 10-year probability of a major osteoporosis-related fracture greater than or equal to 20% based on the [place]-adapted WHO algorithm - Clinician judgment and/or patient preferences may indicate treatment for people with 10-year fracture probabilities above or below these levels 3. Patients with diagnosis of osteoporosis or at high risk for fracture should have regular bone mineral density tests. For patients eligible for Medicare, routine testing is allowed once every 2 years. The testing frequency can be increased to one year for  patients who have rapidly progressing disease, those who are receiving or discontinuing medical therapy to restore bone mass, or have additional risk factors. Electronically Signed   By: Evangeline Dakin M.D.   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 Declined depression.   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   Lumbar degenerative disc disease Chronic lower back pain, inj 07/2019, Gabapentin, ? Restless legs, nighmares   Labs/tests ordered:  None  Next appt:  4 months.

## 2020-10-06 NOTE — Assessment & Plan Note (Signed)
Continue Levothyroxine, TSH 2.24 10/04/20

## 2020-10-06 NOTE — Assessment & Plan Note (Deleted)
Chronic lower back pain, inj 07/2019, Gabapentin, ? Restless legs, nighmares

## 2020-10-06 NOTE — Assessment & Plan Note (Signed)
Surgeon referral, Cologard, avoid constipation.

## 2020-10-07 ENCOUNTER — Encounter: Payer: Self-pay | Admitting: Nurse Practitioner

## 2020-10-13 ENCOUNTER — Encounter: Payer: Self-pay | Admitting: Nurse Practitioner

## 2020-10-13 ENCOUNTER — Telehealth: Payer: Self-pay

## 2020-10-13 NOTE — Assessment & Plan Note (Signed)
Chronic lower back pain, inj 07/2019, Gabapentin, ? Restless legs, nighmares

## 2020-10-13 NOTE — Telephone Encounter (Signed)
Called and spoke with the patient, questions answered and clarification made, the patient was satisfied with the conversation.

## 2020-10-13 NOTE — Telephone Encounter (Signed)
Patient called requesting to speak directly with [facility]. Patient states she was reviewing AVS given to her at her last appointment and noticed a lot of errors.  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

## 2020-10-15 ENCOUNTER — Other Ambulatory Visit: Payer: Self-pay | Admitting: Internal Medicine

## 2020-10-17 NOTE — Progress Notes (Signed)
[place] [address] [place] Phone: [phone] Subjective:   I Kristine [name] am serving as a Education administrator for Dr. Hulan Saas.  This visit occurred during the SARS-CoV-2 public health emergency.  Safety protocols were in place, including screening questions prior to the visit, additional usage of staff PPE, and extensive cleaning of exam room while observing appropriate contact time as indicated for disinfecting solutions.   I'm seeing this patient by the request  of:  Wardell Honour, MD  CC: Neck pain and low back pain follow-up  TDD:[id number]   08/09/2020 Questionable cervical degenerative disc disease could also be contributing.  X-rays ordered today.  Exercises given.  Patient is encouraged to do this 3 times a week.  Increase activity slowly.  Patient is having no true radicular symptoms.  Has had a history of migraines but states that this is a little different.  Follow-up with me again 2 to 3 months  Continues to have intermittent pain that does go down the back.  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  Patient is doing well with the gabapentin.  Patient declined any type of physical therapy or injection today.  Follow-up again for this in 2 to 3 months.  Update 10/18/2020 Kristine [name] is a 80 y.o. female coming in with complaint of neck and back pain. Patient states she is not doing well. States she feels worse than the last visit. States that the past week or so her neck has been more painful. Getting a massage Friday.  Patient states overall she has not been doing the exercises quite as regularly.  Patient does not notice any significant improvement at all.  Has noticed with her low back has some increasing in discomfort with some radiation down both hips.  Seems to be worse when she sits on the left side more radiation but when she does activities more pain on the  right.  Xray cervical spine 08/09/2020 IMPRESSION: Degenerative facet disease.  No acute bony abnormality.     Past Medical History:  Diagnosis Date  . Anxiety and depression   . History of repair of mitral valve   . Hypothyroid    Past Surgical History:  Procedure Laterality Date  . ABDOMINAL HYSTERECTOMY  1974   Dr. Etta Grandchild, [place]  . MITRAL VALVE REPAIR  2018   Dr. Lunette Stands, [place]   Social History   Socioeconomic History  . Marital status: Married    Spouse name: Not on file  . Number of children: Not on file  . Years of education: Not on file  . Highest education level: Associate degree: occupational, Hotel manager, or vocational program  Occupational History  . Not on file  Tobacco Use  . Smoking status: Never Smoker  . Smokeless tobacco: Never Used  Vaping Use  . Vaping Use: Never used  Substance and Sexual Activity  . Alcohol use: Yes    Comment: 1-2 drinks a month   . Drug use: Not on file  . Sexual activity: Not on file  Other Topics Concern  . Not on file  Social History Narrative   Social History      Diet? No       Do you drink/eat things with caffeine? 1/2 cup coffee, tea       Marital status?     Married  What year were you married? 1965      Do you live in a house, apartment, assisted living, condo, trailer, etc.? Apartment       Is it one or more stories? No       How many persons live in your home? 2       Do you have any pets in your home? No       Highest level of education completed? 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         occasionaly                             Type & how often? Walking elliptical before covid       Advanced Directives      Do you have a living will? yes      Do you have a DNR form?                                   If not, do you want to discuss one? No       Do you have signed POA/HPOA for forms? yes      Functional Status      Do you have difficulty bathing or dressing yourself? No       Do you have difficulty preparing food or eating? No       Do you have difficulty managing your medications? No       Do you have difficulty managing your finances? No       Do you have difficulty affording your medications? No       Social Determinants of Radio broadcast assistant Strain: Not on file  Food Insecurity: Not on file  Transportation Needs: Not on file  Physical Activity: Not on file  Stress: Not on file  Social Connections: Not on file   Allergies  Allergen Reactions  . Penicillin G Benzathine & Proc Other (See Comments)    Unknown.   Family History  Problem Relation Age of Onset  . Parkinson's disease Mother   . Dementia Mother   . Prostate cancer Father   . Stroke Sister   . Anxiety disorder Brother   . Depression Brother   . Panic disorder Brother     Current Outpatient Medications (Endocrine & Metabolic):  .  levothyroxine (SYNTHROID) 25 MCG tablet, Take One and half tablet (37.60mcg) by mouth once daily before breakfast on empty stomach 30 minutes before breakfast.  Current Outpatient Medications (Cardiovascular):  .  pravastatin (PRAVACHOL) 10 MG tablet, TAKE 1 TABLET BY MOUTH EVERY DAY   Current Outpatient Medications (Analgesics):  .  aspirin 81 MG chewable tablet, Chew by mouth daily.   Current Outpatient Medications (Other):  Marland Kitchen  Ascorbic Acid (VITAMIN C) 100 MG tablet, Take 100 mg by mouth daily. Marland Kitchen  b complex vitamins tablet, Take 1 tablet by mouth daily. .  calcium-vitamin D (OSCAL WITH D) 250-125 MG-UNIT tablet, Take 1 tablet by mouth daily. Dose unknown-Patient to find out. .  cephALEXin (KEFLEX) 500 MG capsule, Prior the dental visits. .  cholecalciferol (VITAMIN D3) 25 MCG (1000 UT) tablet, Take 1,000 Units by mouth daily. Marland Kitchen  gabapentin  (NEURONTIN) 100 MG  capsule, Take 1 capsule (100 mg total) by mouth at bedtime. (Patient taking differently: Take 200 mg by mouth at bedtime.) .  hydrocortisone (ANUSOL-HC) 2.5 % rectal cream, PLACE 1 APPLICATION RECTALLY 2 (TWO) TIMES DAILY. Marland Kitchen  LORazepam (ATIVAN) 0.5 MG tablet, TAKE 1 TABLET BY MOUTH AT BEDTIME AS NEEDED FOR ANXIETY. CAN TAKE 1/2 TO ONE PILL AS NEEDED AT NIGHT .  MAGNESIUM GLYCINATE PO, Take 2 capsules by mouth daily.  .  sertraline (ZOLOFT) 50 MG tablet, Take 0.5 tablets (25 mg total) by mouth daily.   Reviewed prior external information including notes and imaging from  primary care provider As well as notes that were available from care everywhere and other healthcare systems.  Past medical history, social, surgical and family history all reviewed in electronic medical record.  No pertanent information unless stated regarding to the chief complaint.   Review of Systems:  No headache, visual changes, nausea, vomiting, diarrhea, constipation, dizziness, abdominal pain, skin rash, fevers, chills, night sweats, weight loss, swollen lymph nodes,, joint swelling, chest pain, shortness of breath, mood changes. POSITIVE muscle aches, body aches, fatigue  Objective  Blood pressure 130/82, pulse (!) 55, height 5\' 3"  (1.6 m), weight 140 lb (63.5 kg), SpO2 99 %.   General: No apparent distress alert and oriented x3 mood and affect normal, dressed appropriately.  HEENT: Pupils equal, extraocular movements intact  Respiratory: Patient's speak in full sentences and does not appear short of breath  Cardiovascular: No lower extremity edema, non tender, no erythema  Gait normal with good balance and coordination.  MSK:   Neck exam does have loss of lordosis.  Limited sidebending bilaterally and lacks 10 degrees of extension.  Mild crepitus with range of motion.  Patient does not have any no radiation down the arm.  Low back exam does have positive straight leg test at 30 degrees of  forward flexion bilaterally.  Patient does have very mild weakness of the lower extremities but seems to be symmetric.  Deep tendon reflexes are intact.  Tender to palpation in the L4, L5 paraspinal musculature.    Impression and Recommendations:     The above documentation has been reviewed and is accurate and complete Kristine Pulley, DO

## 2020-10-18 ENCOUNTER — Ambulatory Visit: Payer: Medicare HMO | Admitting: Family Medicine

## 2020-10-18 ENCOUNTER — Encounter: Payer: Self-pay | Admitting: Family Medicine

## 2020-10-18 ENCOUNTER — Other Ambulatory Visit: Payer: Self-pay

## 2020-10-18 VITALS — BP 130/82 | HR 55 | Ht 63.0 in | Wt 140.0 lb

## 2020-10-18 DIAGNOSIS — M5030 Other cervical disc degeneration, unspecified cervical region: Secondary | ICD-10-CM | POA: Diagnosis not present

## 2020-10-18 DIAGNOSIS — M545 Low back pain, unspecified: Secondary | ICD-10-CM

## 2020-10-18 DIAGNOSIS — M5136 Other intervertebral disc degeneration, lumbar region: Secondary | ICD-10-CM

## 2020-10-18 DIAGNOSIS — S32040D Wedge compression fracture of fourth lumbar vertebra, subsequent encounter for fracture with routine healing: Secondary | ICD-10-CM | POA: Diagnosis not present

## 2020-10-18 NOTE — Patient Instructions (Addendum)
PT [facility] MRI lumbar [facility] Ser [phone] OK to take 200mg  of gabapentin at night  See me in 6 weeks

## 2020-10-18 NOTE — Assessment & Plan Note (Signed)
Known degenerative disc disease.  Chronic problem, no improvement with mild exacerbation.  Patient is taking 200 mg of gabapentin.  We will [facility] with formal physical [facility].  Discussed with patient about icing regimen and home exercises.  Follow-up with me again in 6 weeks.

## 2020-10-18 NOTE — Assessment & Plan Note (Signed)
Known degenerative disc disease and history of the L4 compression fracture.  Patient I am concerned Patient is having more radicular symptoms.  Concern for spinal stenosis.  I do feel an MRI is necessary.  Depending on the MRI we could discuss the possibility of epidurals.  Patient is in agreement with the plan.  We will get the MRI for further evaluation and follow-up again in 6 weeks afterwards.

## 2020-10-19 NOTE — Addendum Note (Signed)
Addended by: Pollyann Glen on: 10/19/2020 09:13 AM   Modules accepted: Orders

## 2020-11-01 ENCOUNTER — Other Ambulatory Visit: Payer: Self-pay | Admitting: Family Medicine

## 2020-11-01 ENCOUNTER — Other Ambulatory Visit: Payer: Self-pay | Admitting: Internal Medicine

## 2020-11-02 ENCOUNTER — Other Ambulatory Visit: Payer: Self-pay | Admitting: *Deleted

## 2020-11-02 DIAGNOSIS — M5136 Other intervertebral disc degeneration, lumbar region: Secondary | ICD-10-CM

## 2020-11-02 NOTE — Telephone Encounter (Signed)
Last filled 07/29/2020  No treatment agreement on file, notation made on pending appointment in July

## 2020-11-03 ENCOUNTER — Telehealth: Payer: Self-pay | Admitting: Family Medicine

## 2020-11-03 ENCOUNTER — Other Ambulatory Visit: Payer: Self-pay

## 2020-11-03 MED ORDER — GABAPENTIN 100 MG PO CAPS: 200.0000 mg | ORAL_CAPSULE | Freq: Every day | ORAL | 0 refills | Status: DC

## 2020-11-03 NOTE — Telephone Encounter (Signed)
Please cancel gabapentin at [facility] and resend to [facility], this rx is much cheaper at [facility].

## 2020-11-03 NOTE — Telephone Encounter (Signed)
Rx cancelled. New Rx ordered and sent to [facility]. Patient notified via [place].

## 2020-11-04 ENCOUNTER — Telehealth: Payer: Self-pay | Admitting: Family Medicine

## 2020-11-04 DIAGNOSIS — M4316 Spondylolisthesis, lumbar region: Secondary | ICD-10-CM | POA: Diagnosis not present

## 2020-11-04 DIAGNOSIS — M48061 Spinal stenosis, lumbar region without neurogenic claudication: Secondary | ICD-10-CM | POA: Diagnosis not present

## 2020-11-04 DIAGNOSIS — M47817 Spondylosis without myelopathy or radiculopathy, lumbosacral region: Secondary | ICD-10-CM | POA: Diagnosis not present

## 2020-11-04 DIAGNOSIS — R600 Localized edema: Secondary | ICD-10-CM | POA: Diagnosis not present

## 2020-11-04 DIAGNOSIS — M47816 Spondylosis without myelopathy or radiculopathy, lumbar region: Secondary | ICD-10-CM | POA: Diagnosis not present

## 2020-11-04 DIAGNOSIS — M5136 Other intervertebral disc degeneration, lumbar region: Secondary | ICD-10-CM | POA: Diagnosis not present

## 2020-11-04 NOTE — Telephone Encounter (Signed)
Pt had MRI today, if an epidural is recommended she would like to request Dr. Fabiola Backer. Her husband saw him this morning and highly recommended.  Also, can we note somewhere in Epic that she has a Medtronic heart device implanted.

## 2020-11-07 ENCOUNTER — Other Ambulatory Visit: Payer: Self-pay

## 2020-11-07 DIAGNOSIS — M5416 Radiculopathy, lumbar region: Secondary | ICD-10-CM

## 2020-11-07 NOTE — Telephone Encounter (Signed)
Lets try an L4/5 epidural and see if that helps

## 2020-11-07 NOTE — Telephone Encounter (Signed)
Left message for patient to call back regarding MRI results and epidural.

## 2020-11-07 NOTE — Telephone Encounter (Signed)
Spoke with patient per plan.

## 2020-11-15 ENCOUNTER — Ambulatory Visit
Admission: RE | Admit: 2020-11-15 | Discharge: 2020-11-15 | Disposition: A | Discharge status: Home / Self Care | Payer: Medicare HMO | Source: Ambulatory Visit | Attending: Family Medicine | Admitting: Family Medicine

## 2020-11-15 ENCOUNTER — Other Ambulatory Visit: Payer: Self-pay

## 2020-11-15 ENCOUNTER — Other Ambulatory Visit: Payer: Self-pay | Admitting: Family Medicine

## 2020-11-15 DIAGNOSIS — M5416 Radiculopathy, lumbar region: Secondary | ICD-10-CM

## 2020-11-15 DIAGNOSIS — M47817 Spondylosis without myelopathy or radiculopathy, lumbosacral region: Secondary | ICD-10-CM | POA: Diagnosis not present

## 2020-11-15 IMAGING — XA DG EPIDURAL NERVE ROOT
2 series · 2 of 2 positions shown · non-contrast
Comparison: none

CLINICAL DATA: Lumbosacral spondylosis without myelopathy with
radiculopathy. Right greater than left low back pain and right thigh
pain. 100% relief for about a year after prior right L5 nerve root
block/transforaminal epidural injections at outside facility.

[Series 1: ortho standard · 1 of 1 slices shown (1 of 2)]
[im 1/1]
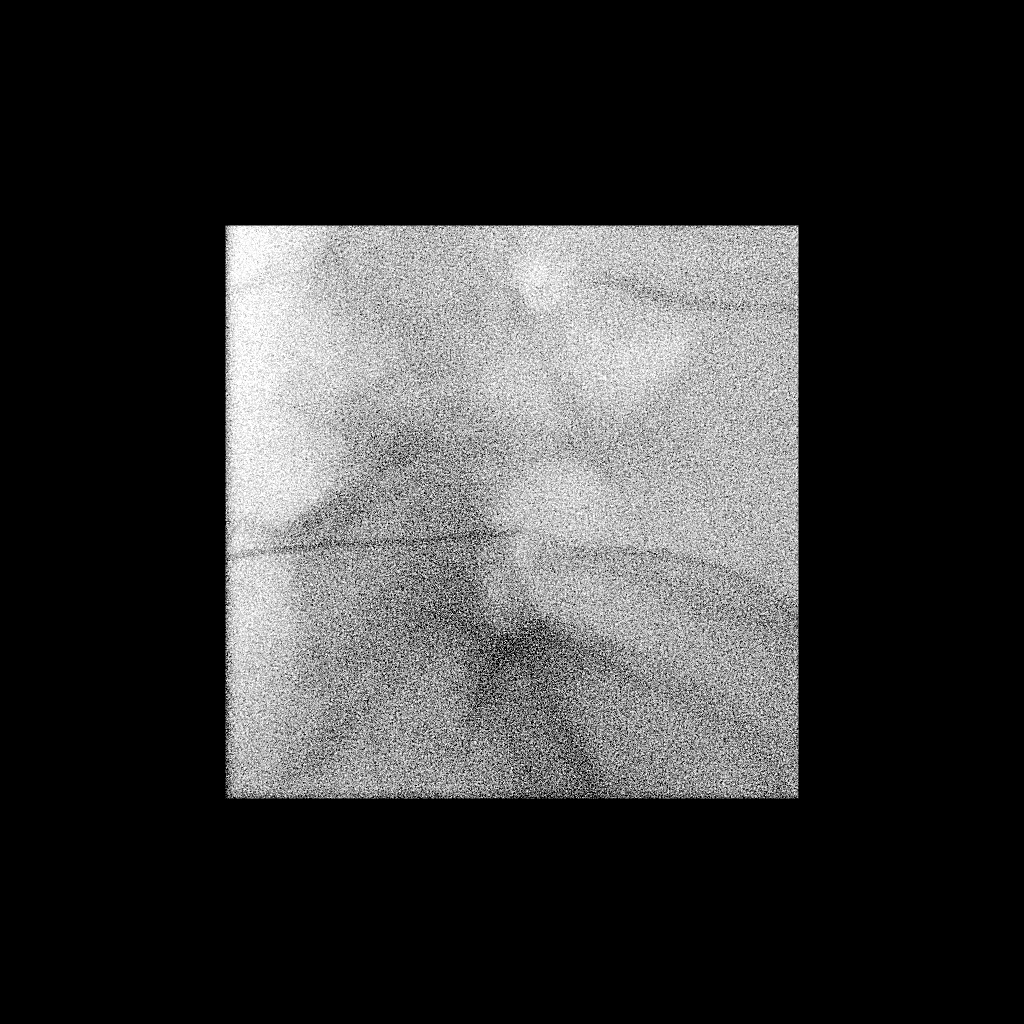

[Series 2: ortho standard · 1 of 1 slices shown (2 of 2)]
[im 1/1]
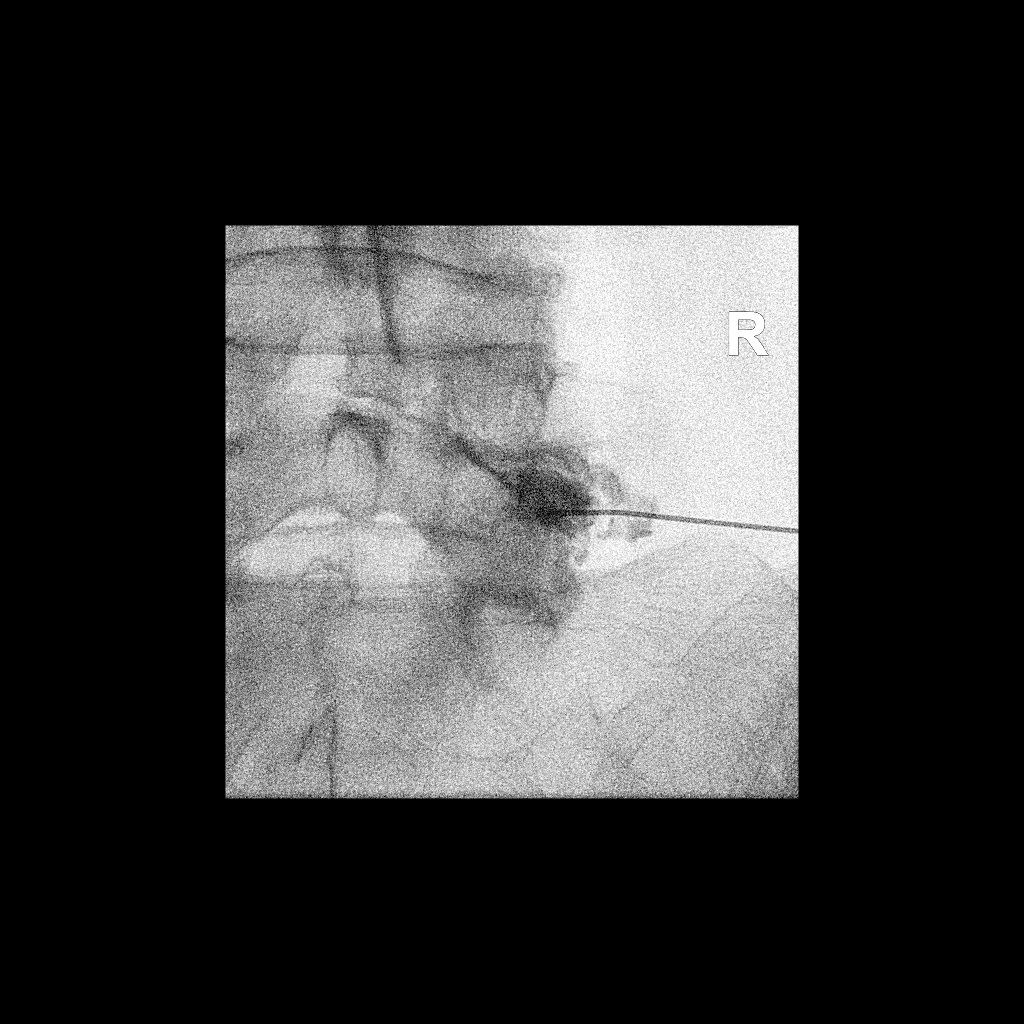

[2 of 2 positions shown; findings below may reference images not displayed]

EXAM:
EPIDURAL/NERVE ROOT

FLUOROSCOPY TIME:  Radiation Exposure Index (as provided by the
fluoroscopic device): 2.7 mGy

Fluoroscopy Time:  26 seconds

Number of Acquired Images:  0

PROCEDURE:
The procedure, risks, benefits, and alternatives were explained to
the patient. Questions regarding the procedure were encouraged and
answered. The patient understands and consents to the procedure.

RIGHT L5 NERVE ROOT BLOCK AND TRANSFORAMINAL EPIDURAL: A posterior
oblique approach was taken to the intervertebral foramen on the
right at L5-S1 using a curved 5 inch 22 gauge spinal needle. An
inferior approach was used due to facet overgrowth obstructing the
superior foramen. Injection of Isovue M 200 outlined the right L5
nerve root and showed good epidural spread. No vascular
opacification is seen. 80 mg of Depo-Medrol mixed with 2 mL of 1%
lidocaine were instilled. The procedure was well-tolerated, and the
patient was discharged thirty minutes following the injection in
good condition.

COMPLICATIONS:
None immediate.
IMPRESSION: Technically successful injection consisting of a right L5 nerve root
block and transforaminal epidural.

## 2020-11-15 MED ORDER — METHYLPREDNISOLONE ACETATE 40 MG/ML INJ SUSP (RADIOLOG
80.0000 mg | Freq: Once | INTRAMUSCULAR | Status: AC
  Administered 2020-11-15: 80 mg via EPIDURAL

## 2020-11-15 MED ORDER — IOPAMIDOL (ISOVUE-M 200) INJECTION 41%
1.0000 mL | Freq: Once | INTRAMUSCULAR | Status: AC
  Administered 2020-11-15: 1 mL via EPIDURAL

## 2020-11-15 NOTE — Discharge Instructions (Signed)

## 2020-12-01 ENCOUNTER — Ambulatory Visit: Payer: Medicare HMO | Admitting: Family Medicine

## 2020-12-09 NOTE — Progress Notes (Signed)
[facility] [address] [place] Phone: [phone] Subjective:   Kristine [name] am serving as a Education administrator for Dr. Hulan Saas.  This visit occurred during the SARS-CoV-2 public health emergency.  Safety protocols were in place, including screening questions prior to the visit, additional usage of staff PPE, and extensive cleaning of exam room while observing appropriate contact time as indicated for disinfecting solutions.   Kristine'm seeing this patient by the request  of:  Wardell Honour, MD  CC: Low back pain follow-up  [id number]   10/18/2020 Known degenerative disc disease and history of the L4 compression fracture.  Patient Kristine am concerned Patient is having more radicular symptoms.  Concern for spinal stenosis.  I do feel an MRI is necessary.  Depending on the MRI we could discuss the possibility of epidurals.  Patient is in agreement with the plan.  We will get the MRI for further evaluation and follow-up again in 6 weeks afterwards.  Known degenerative disc disease.  Chronic problem, no improvement with mild exacerbation.  Patient is taking 200 mg of gabapentin.  We will start with formal physical therapy at friend's home.  Discussed with patient about icing regimen and home exercises.  Follow-up with me again in 6 weeks.  Update 12/13/2020 Kristine [name] is a 80 y.o. female coming in with complaint of cervical and lumbar spine pain. L5 nerve root injection on 11/15/2020. Patient states she is still in pain but it is not as continuous as it was before. Mostly pain with walking. States the nerve root injection helped.      Patient had an MRI from an outside facility where the report showed the patient does have a grade 1 retrolisthesis at L2-L3 and L3-L4 and L1 hemangioma.  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   Past Medical History:  Diagnosis Date  . Anxiety and  depression   . History of repair of mitral valve   . Hypothyroid    Past Surgical History:  Procedure Laterality Date  . ABDOMINAL HYSTERECTOMY  1974   Dr. Etta Grandchild, [place]  . MITRAL VALVE REPAIR  2018   Dr. Lunette Stands, [place]   Social History   Socioeconomic History  . Marital status: Married    Spouse name: Not on file  . Number of children: Not on file  . Years of education: Not on file  . Highest education level: Associate degree: occupational, Hotel manager, or vocational program  Occupational History  . Not on file  Tobacco Use  . Smoking status: Never Smoker  . Smokeless tobacco: Never Used  Vaping Use  . Vaping Use: Never used  Substance and Sexual Activity  . Alcohol use: Yes    Comment: 1-2 drinks a month   . Drug use: Not on file  . Sexual activity: Not on file  Other Topics Concern  . Not on file  Social History Narrative   Social History      Diet? No       Do you drink/eat things with caffeine? 1/2 cup coffee, tea       Marital status?     Married                               What year were you  married? 1965      Do you live in a house, apartment, assisted living, condo, trailer, etc.? Apartment       Is it one or more stories? No       How many persons live in your home? 2       Do you have any pets in your home? No       Highest level of education completed? 2 years college associates degree      Current or past profession: Chaplain Halma Coord/Grief Asst       Do you exercise?         occasionaly                             Type & how often? Walking elliptical before covid       Advanced Directives      Do you have a living will? yes      Do you have a DNR form?                                  If not, do you want to discuss one? No       Do you have signed POA/HPOA for forms? yes      Functional Status      Do you have difficulty bathing or  dressing yourself? No       Do you have difficulty preparing food or eating? No       Do you have difficulty managing your medications? No       Do you have difficulty managing your finances? No       Do you have difficulty affording your medications? No       Social Determinants of Radio broadcast assistant Strain: Not on file  Food Insecurity: Not on file  Transportation Needs: Not on file  Physical Activity: Not on file  Stress: Not on file  Social Connections: Not on file   Allergies  Allergen Reactions  . Penicillin G Benzathine & Proc Other (See Comments)    Unknown.   Family History  Problem Relation Age of Onset  . Parkinson's disease Mother   . Dementia Mother   . Prostate cancer Father   . Stroke Sister   . Anxiety disorder Brother   . Depression Brother   . Panic disorder Brother     Current Outpatient Medications (Endocrine & Metabolic):  .  levothyroxine (SYNTHROID) 25 MCG tablet, Take One and half tablet (37.65mcg) by mouth once daily before breakfast on empty stomach 30 minutes before breakfast.  Current Outpatient Medications (Cardiovascular):  .  pravastatin (PRAVACHOL) 10 MG tablet, TAKE 1 TABLET BY MOUTH EVERY DAY   Current Outpatient Medications (Analgesics):  .  aspirin 81 MG chewable tablet, Chew by mouth daily.   Current Outpatient Medications (Other):  Marland Kitchen  Ascorbic Acid (VITAMIN C) 100 MG tablet, Take 100 mg by mouth daily. Marland Kitchen  b complex vitamins tablet, Take 1 tablet by mouth daily. .  calcium-vitamin D (OSCAL WITH D) 250-125 MG-UNIT tablet, Take 1 tablet by mouth daily. Dose unknown-Patient to find out. .  cephALEXin (KEFLEX) 500 MG capsule, Prior the dental visits. .  cholecalciferol (VITAMIN D3) 25 MCG (1000 UT) tablet, Take 1,000 Units by mouth daily. Marland Kitchen  gabapentin (NEURONTIN) 100 MG capsule, Take 2 capsules (200  mg total) by mouth at bedtime. .  hydrocortisone (ANUSOL-HC) 2.5 % rectal cream, PLACE 1 APPLICATION RECTALLY 2 (TWO) TIMES  DAILY. Marland Kitchen  LORazepam (ATIVAN) 0.5 MG tablet, TAKE 1 TABLET BY MOUTH AT BEDTIME AS NEEDED FOR ANXIETY. CAN TAKE 1/2 TO ONE PILL AS NEEDED AT NIGHT .  MAGNESIUM GLYCINATE PO, Take 2 capsules by mouth daily.  .  sertraline (ZOLOFT) 50 MG tablet, Take 0.5 tablets (25 mg total) by mouth daily.   Reviewed prior external information including notes and imaging from  primary care provider As well as notes that were available from care everywhere and other healthcare systems.  Past medical history, social, surgical and family history all reviewed in electronic medical record.  No pertanent information unless stated regarding to the chief complaint.   Review of Systems:  No headache, visual changes, nausea, vomiting, diarrhea, constipation, dizziness, abdominal pain, skin rash, fevers, chills, night sweats, weight loss, swollen lymph nodes, body aches, joint swelling, chest pain, shortness of breath, mood changes. POSITIVE muscle aches  Objective  Blood pressure 110/82, pulse 66, height 5\' 3"  (1.6 m), weight 139 lb (63 kg), SpO2 99 %.   General: No apparent distress alert and oriented x3 mood and affect normal, dressed appropriately.  HEENT: Pupils equal, extraocular movements intact  Respiratory: Patient's speak in full sentences and does not appear short of breath  Cardiovascular: No lower extremity edema, non tender, no erythema  Gait normal with good balance and coordination.  MSK: Low back exam shows mild loss of lordosis.  Patient still tender to palpation.  Some mild increasing pain with flexion and extension.  Still significant improvement from previous exam. Neck exam does have some mild loss of lordosis.  Some tenderness to palpation.  Very mild increase in the kyphosis of the upper thoracic spine.   Impression and Recommendations:    The above documentation has been reviewed and is accurate and complete Lyndal Pulley, DO

## 2020-12-13 ENCOUNTER — Other Ambulatory Visit: Payer: Self-pay

## 2020-12-13 ENCOUNTER — Encounter: Payer: Self-pay | Admitting: Family Medicine

## 2020-12-13 ENCOUNTER — Ambulatory Visit: Payer: Medicare HMO | Admitting: Family Medicine

## 2020-12-13 VITALS — BP 110/82 | HR 66 | Ht 63.0 in | Wt 139.0 lb

## 2020-12-13 DIAGNOSIS — M545 Low back pain, unspecified: Secondary | ICD-10-CM | POA: Diagnosis not present

## 2020-12-13 DIAGNOSIS — G8929 Other chronic pain: Secondary | ICD-10-CM | POA: Diagnosis not present

## 2020-12-13 DIAGNOSIS — M5416 Radiculopathy, lumbar region: Secondary | ICD-10-CM | POA: Diagnosis not present

## 2020-12-13 DIAGNOSIS — M542 Cervicalgia: Secondary | ICD-10-CM

## 2020-12-13 DIAGNOSIS — G2589 Other specified extrapyramidal and movement disorders: Secondary | ICD-10-CM | POA: Diagnosis not present

## 2020-12-13 NOTE — Assessment & Plan Note (Signed)
History of a scapular dyskinesis as well as some arthritic changes of the cervical region.  We will start with formal physical therapy.  Patient does motivated to make differences.  Hopefully this will make improvement.  Follow-up with me again in 6 to 8 weeks.  Worsening pain can consider the possibility of advanced imaging but encouraged patient to monitor closely.

## 2020-12-13 NOTE — Assessment & Plan Note (Signed)
Patient responded very well to the right L5 nerve root injection on May 3.  He is feeling much better.  History of compression fracture at the L4 level.  Encourage patient to start with formal physical therapy.  Patient does state having some neck pain and does have known degenerative disc disease.  We will see how patient responds to the physical therapy at this point.  No change in medications but does have the gabapentin.  We will follow-up with me again 6 to 8 weeks.  Total time reviewing patient's previous imaging with an MRI as well as injections greater than 31 minutes

## 2020-12-13 NOTE — Patient Instructions (Addendum)
Good to see you PT for lower back and neck Overall looking much better See me again in 7-8 weeks

## 2020-12-19 DIAGNOSIS — M542 Cervicalgia: Secondary | ICD-10-CM | POA: Diagnosis not present

## 2020-12-19 DIAGNOSIS — R293 Abnormal posture: Secondary | ICD-10-CM | POA: Diagnosis not present

## 2020-12-19 DIAGNOSIS — M5459 Other low back pain: Secondary | ICD-10-CM | POA: Diagnosis not present

## 2020-12-21 DIAGNOSIS — R293 Abnormal posture: Secondary | ICD-10-CM | POA: Diagnosis not present

## 2020-12-21 DIAGNOSIS — M542 Cervicalgia: Secondary | ICD-10-CM | POA: Diagnosis not present

## 2020-12-21 DIAGNOSIS — M5459 Other low back pain: Secondary | ICD-10-CM | POA: Diagnosis not present

## 2020-12-23 DIAGNOSIS — M5459 Other low back pain: Secondary | ICD-10-CM | POA: Diagnosis not present

## 2020-12-23 DIAGNOSIS — R293 Abnormal posture: Secondary | ICD-10-CM | POA: Diagnosis not present

## 2020-12-23 DIAGNOSIS — M542 Cervicalgia: Secondary | ICD-10-CM | POA: Diagnosis not present

## 2020-12-26 DIAGNOSIS — R293 Abnormal posture: Secondary | ICD-10-CM | POA: Diagnosis not present

## 2020-12-26 DIAGNOSIS — M542 Cervicalgia: Secondary | ICD-10-CM | POA: Diagnosis not present

## 2020-12-26 DIAGNOSIS — M5459 Other low back pain: Secondary | ICD-10-CM | POA: Diagnosis not present

## 2020-12-28 DIAGNOSIS — M542 Cervicalgia: Secondary | ICD-10-CM | POA: Diagnosis not present

## 2020-12-28 DIAGNOSIS — M5459 Other low back pain: Secondary | ICD-10-CM | POA: Diagnosis not present

## 2020-12-28 DIAGNOSIS — R293 Abnormal posture: Secondary | ICD-10-CM | POA: Diagnosis not present

## 2020-12-30 DIAGNOSIS — R293 Abnormal posture: Secondary | ICD-10-CM | POA: Diagnosis not present

## 2020-12-30 DIAGNOSIS — M5459 Other low back pain: Secondary | ICD-10-CM | POA: Diagnosis not present

## 2020-12-30 DIAGNOSIS — M542 Cervicalgia: Secondary | ICD-10-CM | POA: Diagnosis not present

## 2021-01-03 DIAGNOSIS — R293 Abnormal posture: Secondary | ICD-10-CM | POA: Diagnosis not present

## 2021-01-03 DIAGNOSIS — M5459 Other low back pain: Secondary | ICD-10-CM | POA: Diagnosis not present

## 2021-01-03 DIAGNOSIS — M542 Cervicalgia: Secondary | ICD-10-CM | POA: Diagnosis not present

## 2021-01-04 DIAGNOSIS — M542 Cervicalgia: Secondary | ICD-10-CM | POA: Diagnosis not present

## 2021-01-04 DIAGNOSIS — R293 Abnormal posture: Secondary | ICD-10-CM | POA: Diagnosis not present

## 2021-01-04 DIAGNOSIS — M5459 Other low back pain: Secondary | ICD-10-CM | POA: Diagnosis not present

## 2021-01-06 DIAGNOSIS — R293 Abnormal posture: Secondary | ICD-10-CM | POA: Diagnosis not present

## 2021-01-06 DIAGNOSIS — M5459 Other low back pain: Secondary | ICD-10-CM | POA: Diagnosis not present

## 2021-01-06 DIAGNOSIS — M542 Cervicalgia: Secondary | ICD-10-CM | POA: Diagnosis not present

## 2021-01-07 ENCOUNTER — Other Ambulatory Visit: Payer: Self-pay | Admitting: Internal Medicine

## 2021-01-07 DIAGNOSIS — F3341 Major depressive disorder, recurrent, in partial remission: Secondary | ICD-10-CM

## 2021-01-09 DIAGNOSIS — M542 Cervicalgia: Secondary | ICD-10-CM | POA: Diagnosis not present

## 2021-01-09 DIAGNOSIS — M5459 Other low back pain: Secondary | ICD-10-CM | POA: Diagnosis not present

## 2021-01-09 DIAGNOSIS — R293 Abnormal posture: Secondary | ICD-10-CM | POA: Diagnosis not present

## 2021-01-11 DIAGNOSIS — M542 Cervicalgia: Secondary | ICD-10-CM | POA: Diagnosis not present

## 2021-01-11 DIAGNOSIS — R293 Abnormal posture: Secondary | ICD-10-CM | POA: Diagnosis not present

## 2021-01-11 DIAGNOSIS — M5459 Other low back pain: Secondary | ICD-10-CM | POA: Diagnosis not present

## 2021-01-16 DIAGNOSIS — M542 Cervicalgia: Secondary | ICD-10-CM | POA: Diagnosis not present

## 2021-01-16 DIAGNOSIS — R293 Abnormal posture: Secondary | ICD-10-CM | POA: Diagnosis not present

## 2021-01-16 DIAGNOSIS — M5459 Other low back pain: Secondary | ICD-10-CM | POA: Diagnosis not present

## 2021-01-27 ENCOUNTER — Encounter: Payer: Self-pay | Admitting: Internal Medicine

## 2021-01-31 ENCOUNTER — Other Ambulatory Visit: Payer: Self-pay | Admitting: Family Medicine

## 2021-02-01 ENCOUNTER — Encounter: Payer: Self-pay | Admitting: Family Medicine

## 2021-02-01 ENCOUNTER — Other Ambulatory Visit: Payer: Self-pay

## 2021-02-01 ENCOUNTER — Non-Acute Institutional Stay (INDEPENDENT_AMBULATORY_CARE_PROVIDER_SITE_OTHER): Payer: Medicare HMO | Admitting: Family Medicine

## 2021-02-01 ENCOUNTER — Other Ambulatory Visit: Payer: Self-pay | Admitting: Internal Medicine

## 2021-02-01 VITALS — BP 116/70 | HR 68 | Temp 95.9°F | Ht 63.0 in | Wt 134.8 lb

## 2021-02-01 DIAGNOSIS — F5105 Insomnia due to other mental disorder: Secondary | ICD-10-CM | POA: Diagnosis not present

## 2021-02-01 DIAGNOSIS — E039 Hypothyroidism, unspecified: Secondary | ICD-10-CM | POA: Diagnosis not present

## 2021-02-01 DIAGNOSIS — I129 Hypertensive chronic kidney disease with stage 1 through stage 4 chronic kidney disease, or unspecified chronic kidney disease: Secondary | ICD-10-CM

## 2021-02-01 DIAGNOSIS — N1831 Hypertensive chronic kidney disease with stage 1 through stage 4 chronic kidney disease, or unspecified chronic kidney disease: Secondary | ICD-10-CM

## 2021-02-01 DIAGNOSIS — E785 Hyperlipidemia, unspecified: Secondary | ICD-10-CM | POA: Diagnosis not present

## 2021-02-01 DIAGNOSIS — R69 Illness, unspecified: Secondary | ICD-10-CM | POA: Diagnosis not present

## 2021-02-01 DIAGNOSIS — F418 Other specified anxiety disorders: Secondary | ICD-10-CM

## 2021-02-01 NOTE — Progress Notes (Signed)
Provider:  Alain Honey, MD  Careteam: Patient Care Team: Kristine Honour, MD as PCP - General (Family Medicine)  PLACE OF SERVICE:  [place]  Advanced Directive information    Allergies  Allergen Reactions   Penicillin G Benzathine & Proc Other (See Comments)    Unknown.    Chief Complaint  Patient presents with   Medical Management of Chronic Issues    Patient presents today for a 4 month follow-up.     HPI: Patient is a 80 y.o. female.  This is a 42-month follow-up visit for this nice lady.  She brings with her a list of questions and concerns today. From first we talked about tapering the sertraline.  She is only on 25 mg at this time.  I explained that that probably is not helping greatly and would be okay to taper she says that she does not feel depressed and therefore does not need to medicine.  9 she continues with low back pain as well as neck pain.  She is seeing sports medicine doctor in town and they have prescribed physical therapy.  She has also had injections in her back which have helped thus far.  She was told she is not a candidate for surgery due to "arthritis." We reviewed labs that she had in March.  Her LDL is at 95 on pravastatin.  Random glucose was 80 and BUN and creatinine were 18 and 0.96 respectively.  She had been told sometime in the past that she had stage III renal failure but these numbers are not consistent with that statement.  Thyroid (TSH) was normal at 2.24.  Review of Systems:  Review of Systems  Constitutional: Negative.   HENT: Negative.    Respiratory: Negative.    Cardiovascular: Negative.   Gastrointestinal: Negative.   Genitourinary: Negative.   Musculoskeletal:  Positive for back pain.  Skin: Negative.   Psychiatric/Behavioral:  Negative for depression. The patient is nervous/anxious and has insomnia.        Kristine [name] and insomnia are not frequent.  She does have lorazepam that she takes as needed.  She also does have some  neck and headache that she relates to periods of stress  All other systems reviewed and are negative.  Past Medical History:  Diagnosis Date   Anxiety and depression    History of repair of mitral valve    Hypothyroid    Past Surgical History:  Procedure Laterality Date   ABDOMINAL HYSTERECTOMY  1974   Dr. Etta [name], [facility] [place]   MITRAL VALVE REPAIR  2018   Dr. Lunette [name], [place]   Social History:   reports that she has never smoked. She has never used smokeless tobacco. She reports current alcohol use. No history on file for drug use.  Family History  Problem Relation Age of Onset   Parkinson's disease Mother    Dementia Mother    Prostate cancer Father    Stroke Sister    Anxiety disorder Brother    Depression Brother    Panic disorder Brother     Medications: Patient's Medications  New Prescriptions   No medications on file  Previous Medications   ASCORBIC ACID (VITAMIN C) 100 MG TABLET    Take 100 mg by mouth daily.   ASPIRIN 81 MG CHEWABLE TABLET    Chew by mouth daily.   B COMPLEX VITAMINS TABLET    Take 1 tablet  by mouth daily.   CALCIUM-VITAMIN D (OSCAL WITH D) 250-125 MG-UNIT TABLET    Take 1 tablet by mouth daily. Dose unknown-Patient to find out.   CEPHALEXIN (KEFLEX) 500 MG CAPSULE    Prior the dental visits.   CHOLECALCIFEROL (VITAMIN D3) 25 MCG (1000 UT) TABLET    Take 1,000 Units by mouth daily.   GABAPENTIN (NEURONTIN) 100 MG CAPSULE    TAKE TWO CAPSULES BY MOUTH DAILY AT BEDTIME   HYDROCORTISONE (ANUSOL-HC) 2.5 % RECTAL CREAM    PLACE 1 APPLICATION RECTALLY 2 (TWO) TIMES DAILY.   LEVOTHYROXINE (SYNTHROID) 25 MCG TABLET    TAKE ONE AND HALF TABLET (37.5MCG) BY MOUTH ONCE DAILY BEFORE BREAKFAST ON EMPTY STOMACH 30 MINUTES BEFORE BREAKFAST.   LORAZEPAM (ATIVAN) 0.5 MG TABLET    TAKE 1 TABLET BY MOUTH AT BEDTIME AS NEEDED FOR ANXIETY. CAN TAKE 1/2 TO ONE PILL AS NEEDED AT NIGHT   MAGNESIUM GLYCINATE  PO    Take 2 capsules by mouth daily.    SERTRALINE (ZOLOFT) 50 MG TABLET    Take 0.5 tablets (25 mg total) by mouth daily.  Modified Medications   No medications on file  Discontinued Medications   PRAVASTATIN (PRAVACHOL) 10 MG TABLET    TAKE 1 TABLET BY MOUTH EVERY DAY    Physical Exam:  Vitals:   02/01/21 1433  BP: 116/70  Pulse: 68  Temp: (!) 95.9 F (35.5 C)  TempSrc: Temporal  SpO2: 98%  Weight: 134 lb 12.8 oz (61.1 kg)  Height: 5\' 3"  (1.6 m)   Body mass index is 23.88 kg/m. Wt Readings from Last 3 Encounters:  02/01/21 134 lb 12.8 oz (61.1 kg)  12/13/20 139 lb (63 kg)  10/18/20 140 lb (63.5 kg)    Physical Exam Vitals and nursing note reviewed.  Constitutional:      Appearance: Normal appearance.  Cardiovascular:     Rate and Rhythm: Normal rate and regular rhythm.     Heart sounds: No murmur heard. Pulmonary:     Effort: Pulmonary effort is normal.     Breath sounds: Normal breath sounds.  Musculoskeletal:        General: Normal range of motion.     Comments: There is no tenderness in the lumbar or cervical regions of the neck and back  Neurological:     General: No focal deficit present.     Mental Status: She is alert and oriented to person, place, and time.  Psychiatric:        Mood and Affect: Mood normal.        Behavior: Behavior normal.     Comments: Several family members have recently question her memory.  She says she thinks there may be a problem.  Seems to be worse at night when she is tired.    Labs reviewed: Basic Metabolic Panel: Recent Labs    10/04/20 0740  NA 141  K 4.9  CL 105  CO2 28  GLUCOSE 80  BUN 18  CREATININE 0.96*  CALCIUM 9.4  TSH 2.24   Liver Function Tests: Recent Labs    10/04/20 0740  AST 20  ALT 13  BILITOT 0.6  PROT 7.0   No results for input(s): LIPASE, AMYLASE in the last 8760 hours. No results for input(s): AMMONIA in the last 8760 hours. CBC: Recent Labs    10/04/20 0740  WBC 4.9  NEUTROABS  2,205  HGB 15.5  HCT 46.1*  MCV 94.7  PLT 293   Lipid Panel:  Recent Labs    10/04/20 0740  CHOL 179  HDL 60  LDLCALC 95  TRIG 139  CHOLHDL 3.0   TSH: Recent Labs    10/04/20 0740  TSH 2.24   A1C: No results found for: HGBA1C   Assessment/Plan  1. Hyperlipidemia, unspecified hyperlipidemia type Lipids are managed on pravastatin.  There are no side effects.  Plan to continue  2. Hypothyroidism, unspecified type Normal TSH.  She does take levothyroxine.  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  Does not feel as anxious or depressed as previou  4. 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   Kristine Honey, MD [facility] [phone]

## 2021-02-02 ENCOUNTER — Other Ambulatory Visit: Payer: Self-pay | Admitting: Nurse Practitioner

## 2021-02-02 NOTE — Telephone Encounter (Signed)
Patient has request refill on medication "Lorazepam 0.5mg ". Patient last refill was 11/03/2020. Patient is due for refill. Medication pend and sent to Marlowe Sax, NP due to PCP being out of office. Please Advise.

## 2021-02-17 ENCOUNTER — Telehealth: Payer: Medicare HMO | Admitting: *Deleted

## 2021-02-17 NOTE — Telephone Encounter (Signed)
Patient called and stated that she wanted to let Dr. Sabra Heck know that she has decided to stay on her Sertraline and NOT come off of it.   FYI

## 2021-03-17 DIAGNOSIS — H5203 Hypermetropia, bilateral: Secondary | ICD-10-CM | POA: Diagnosis not present

## 2021-03-17 DIAGNOSIS — H2513 Age-related nuclear cataract, bilateral: Secondary | ICD-10-CM | POA: Diagnosis not present

## 2021-04-17 ENCOUNTER — Ambulatory Visit: Payer: Medicare HMO | Admitting: Sports Medicine

## 2021-04-17 ENCOUNTER — Other Ambulatory Visit: Payer: Self-pay

## 2021-04-17 VITALS — BP 110/70 | HR 66 | Ht 63.0 in | Wt 138.0 lb

## 2021-04-17 DIAGNOSIS — M545 Low back pain, unspecified: Secondary | ICD-10-CM | POA: Diagnosis not present

## 2021-04-17 DIAGNOSIS — M62830 Muscle spasm of back: Secondary | ICD-10-CM

## 2021-04-17 MED ORDER — CYCLOBENZAPRINE HCL 5 MG PO TABS
5.0000 mg | ORAL_TABLET | Freq: Every evening | ORAL | 0 refills | Status: DC | PRN
Start: 2021-04-17 — End: 2021-07-31

## 2021-04-17 MED ORDER — MELOXICAM 7.5 MG PO TABS: 7.5000 mg | ORAL_TABLET | Freq: Every day | ORAL | 0 refills | Status: DC

## 2021-04-17 MED ORDER — MELOXICAM 7.5 MG PO TABS: 15.0000 mg | ORAL_TABLET | Freq: Every day | ORAL | 0 refills | Status: DC

## 2021-04-17 NOTE — Patient Instructions (Signed)
Good to see you  Meloxicam 7.5mg  daily for 2 weeks  Flexeril 5mg  at night as needed  Follow up in two weeks

## 2021-04-17 NOTE — Progress Notes (Signed)
Kristine [name] D.[place] Phone: [phone]   Assessment and Plan:     1. Acute right-sided low back pain without sciatica 2. Back muscle spasm -Acute, significant pain, initial sports medicine visit - Likely acute strain and back spasms of right lumbar paraspinal musculature based on physical exam, HPI - No x-ray obtained as there was no MOI, red flag symptoms, or radiation of pain.  With history of low back pain, retrolisthesis, compression fracture, we will trial conservative therapy for 1 to 2 weeks, and if not improving will proceed with additional imaging - Start meloxicam 7.5 mg daily for 2 weeks - Start Flexeril 5 mg nightly as needed for pain relief - cyclobenzaprine (FLEXERIL) 5 MG tablet; Take 1 tablet (5 mg total) by mouth at bedtime as needed for muscle spasms.  Dispense: 10 tablet; Refill: 0 - meloxicam (MOBIC) 7.5 MG tablet; Take 1 tablets (7.5 mg total) by mouth daily.  Dispense: 14 tablet; Refill: 0     Pertinent previous records reviewed include previous sportsman note   Follow Up: 1 to 2 weeks if no improvement or worsening symptoms.  Would order x-ray at that time   Subjective:   I, Kristine [name], am serving as a scribe for Dr. Glennon [name]  Chief Complaint: Back pain   HPI:   04/17/21 Patient is a 80 year old female presenting with right sided back pain after taking a nap after church yesterday and rolled out of bed. Patient states that it feels like there is something "rubbing" in that area causing the pain. Pain has just started to radiate to mid back. Patient states that the pain is constant and sharp Has used heat to help with the pain. Patient can not find any position to help ease the pain.     Relevant Historical Information: Lumbar radiculopathy, compression fracture L4, osteopenia, DDD  Additional pertinent review of systems negative.   Current Outpatient  Medications:    Ascorbic Acid (VITAMIN C) 100 MG tablet, Take 100 mg by mouth daily., Disp: , Rfl:    aspirin 81 MG chewable tablet, Chew by mouth daily., Disp: , Rfl:    b complex vitamins tablet, Take 1 tablet by mouth daily., Disp: , Rfl:    calcium-vitamin D (OSCAL WITH D) 250-125 MG-UNIT tablet, Take 1 tablet by mouth daily. Dose unknown-Patient to find out., Disp: , Rfl:    cholecalciferol (VITAMIN D3) 25 MCG (1000 UT) tablet, Take 1,000 Units by mouth daily., Disp: , Rfl:    cyclobenzaprine (FLEXERIL) 5 MG tablet, Take 1 tablet (5 mg total) by mouth at bedtime as needed for muscle spasms., Disp: 10 tablet, Rfl: 0   gabapentin (NEURONTIN) 100 MG capsule, TAKE TWO CAPSULES BY MOUTH DAILY AT BEDTIME, Disp: 180 capsule, Rfl: 0   hydrocortisone (ANUSOL-HC) 2.5 % rectal cream, PLACE 1 APPLICATION RECTALLY 2 (TWO) TIMES DAILY., Disp: 30 g, Rfl: 1   levothyroxine (SYNTHROID) 25 MCG tablet, TAKE ONE AND HALF TABLET (37.5MCG) BY MOUTH ONCE DAILY BEFORE BREAKFAST ON EMPTY STOMACH 30 MINUTES BEFORE BREAKFAST., Disp: 135 tablet, Rfl: 1   LORazepam (ATIVAN) 0.5 MG tablet, TAKE 1 TABLET BY MOUTH AT BEDTIME AS NEEDED FOR ANXIETY. CAN TAKE 1/2 TO ONE PILL AS NEEDED AT NIGHT, Disp: 30 tablet, Rfl: 0   MAGNESIUM GLYCINATE PO, Take 2 capsules by mouth daily. , Disp: , Rfl:    cephALEXin (KEFLEX) 500 MG capsule, Prior the dental visits. (Patient not taking:  Reported on 04/17/2021), Disp: , Rfl:    meloxicam (MOBIC) 7.5 MG tablet, Take 1 tablet (7.5 mg total) by mouth daily., Disp: 14 tablet, Rfl: 0   sertraline (ZOLOFT) 50 MG tablet, Take 0.5 tablets (25 mg total) by mouth daily., Disp: 45 tablet, Rfl: 1   Objective:     Vitals:   04/17/21 1001  BP: 110/70  Pulse: 66  SpO2: 98%  Weight: 138 lb (62.6 kg)  Height: 5\' 3"  (1.6 m)      Body mass index is 24.45 kg/m.    Physical Exam:    Gen: Appears well, nad, nontoxic and pleasant Psych: Alert and oriented, appropriate mood and affect Neuro: sensation  intact, strength is 5/5 in upper and lower extremities, muscle tone wnl Skin: no susupicious lesions or rashes  Back - Normal skin, Spine with normal alignment and no deformity.   No tenderness to vertebral process palpation.   Paraspinous muscles are not tender and without spasm Straight leg raise negative Patient experiences moderate to significant pain in right lower back with truncal rotation, standing, that is nonradiating   Electronically signed by:  Kristine [name] D.[facility] 10:49 AM 04/17/21

## 2021-04-20 ENCOUNTER — Encounter: Payer: Self-pay | Admitting: Sports Medicine

## 2021-04-23 ENCOUNTER — Other Ambulatory Visit: Payer: Self-pay | Admitting: Family

## 2021-04-23 ENCOUNTER — Other Ambulatory Visit: Payer: Self-pay | Admitting: Internal Medicine

## 2021-04-23 DIAGNOSIS — F3341 Major depressive disorder, recurrent, in partial remission: Secondary | ICD-10-CM

## 2021-04-24 ENCOUNTER — Encounter: Payer: Self-pay | Admitting: Sports Medicine

## 2021-04-24 ENCOUNTER — Encounter: Payer: Self-pay | Admitting: Family Medicine

## 2021-04-24 NOTE — Telephone Encounter (Signed)
Patient has request refill on medication "Zoloft 50mg ". This medication is still on patient medication list but states it has ended. Patient medication last refill was 07/14/2020. Medication pend and sent to Sherrie Mustache, NP due to PCP Sabra Heck Lillette Boxer, MD being out of office.

## 2021-04-24 NOTE — Telephone Encounter (Signed)
Patient has request refill on medication "Lorazepam". Patient last refill was 02/02/2021. Patient medication pend and sent to Sherrie Mustache, NP due to PCP Sabra Heck Lillette Boxer, MD being out of office.

## 2021-04-25 ENCOUNTER — Other Ambulatory Visit: Payer: Self-pay | Admitting: Family Medicine

## 2021-04-25 DIAGNOSIS — L658 Other specified nonscarring hair loss: Secondary | ICD-10-CM

## 2021-04-25 MED ORDER — MINOXIDIL 5 % EX FOAM: CUTANEOUS | 0 refills | Status: DC

## 2021-05-02 ENCOUNTER — Other Ambulatory Visit: Payer: Self-pay | Admitting: Family Medicine

## 2021-06-23 DIAGNOSIS — H524 Presbyopia: Secondary | ICD-10-CM | POA: Diagnosis not present

## 2021-06-23 DIAGNOSIS — Z0100 Encounter for examination of eyes and vision without abnormal findings: Secondary | ICD-10-CM | POA: Diagnosis not present

## 2021-06-23 DIAGNOSIS — H52223 Regular astigmatism, bilateral: Secondary | ICD-10-CM | POA: Diagnosis not present

## 2021-07-05 ENCOUNTER — Telehealth: Payer: Self-pay | Admitting: *Deleted

## 2021-07-05 ENCOUNTER — Other Ambulatory Visit: Payer: Self-pay | Admitting: Family Medicine

## 2021-07-05 MED ORDER — NIRMATRELVIR/RITONAVIR (PAXLOVID)TABLET: 3.0000 | ORAL_TABLET | Freq: Two times a day (BID) | ORAL | 0 refills | Status: AC

## 2021-07-05 NOTE — Telephone Encounter (Signed)
Patient called and stated that she in on 2nd day of being COVID Positive.  Stated that she has a Cough, Sore Throat, Weakness and some SOB. No Fever.   Patient is requesting something to be called into pharmacy to help with her symptoms.   Pharmacy: [facility]  Please Advise.

## 2021-07-06 ENCOUNTER — Other Ambulatory Visit: Payer: Self-pay | Admitting: Nurse Practitioner

## 2021-07-06 ENCOUNTER — Other Ambulatory Visit: Payer: Self-pay | Admitting: Family Medicine

## 2021-07-06 NOTE — Telephone Encounter (Signed)
RX last filled 04/24/21 # 30/0 refills  No treatment agreement on file, notation made on pending appointment for Jan 2023

## 2021-07-06 NOTE — Telephone Encounter (Signed)
Dr. Sabra Heck called in [place] for patient yesterday and patient picked it up yesterday and started it.   Called today to check on patient and husband stated that she was doing better. Patient will call if worsens or anything changes.

## 2021-07-24 DIAGNOSIS — H90A22 Sensorineural hearing loss, unilateral, left ear, with restricted hearing on the contralateral side: Secondary | ICD-10-CM | POA: Diagnosis not present

## 2021-07-24 DIAGNOSIS — H90A31 Mixed conductive and sensorineural hearing loss, unilateral, right ear with restricted hearing on the contralateral side: Secondary | ICD-10-CM | POA: Diagnosis not present

## 2021-07-25 ENCOUNTER — Other Ambulatory Visit: Payer: Self-pay | Admitting: Family Medicine

## 2021-07-31 ENCOUNTER — Other Ambulatory Visit: Payer: Self-pay

## 2021-07-31 ENCOUNTER — Encounter: Payer: Self-pay | Admitting: Orthopedic Surgery

## 2021-07-31 ENCOUNTER — Non-Acute Institutional Stay: Payer: Medicare HMO | Admitting: Orthopedic Surgery

## 2021-07-31 VITALS — BP 131/84 | HR 86 | Temp 97.4°F | Resp 16 | Ht 63.0 in | Wt 140.5 lb

## 2021-07-31 DIAGNOSIS — R0981 Nasal congestion: Secondary | ICD-10-CM | POA: Diagnosis not present

## 2021-07-31 DIAGNOSIS — R69 Illness, unspecified: Secondary | ICD-10-CM | POA: Diagnosis not present

## 2021-07-31 DIAGNOSIS — F418 Other specified anxiety disorders: Secondary | ICD-10-CM

## 2021-07-31 DIAGNOSIS — F5105 Insomnia due to other mental disorder: Secondary | ICD-10-CM

## 2021-07-31 DIAGNOSIS — J029 Acute pharyngitis, unspecified: Secondary | ICD-10-CM | POA: Diagnosis not present

## 2021-07-31 MED ORDER — PREDNISONE 10 MG PO TABS: ORAL_TABLET | ORAL | 0 refills | Status: AC

## 2021-07-31 NOTE — Progress Notes (Signed)
Careteam: Patient Care Team: Wardell Honour, MD as PCP - General (Family Medicine)  Seen by: Windell Moulding, AGNP-C  PLACE OF SERVICE:  [place] Directive information Does Patient Have a Medical Advance Directive?: Yes, Would patient like information on creating a medical advance directive?: No - Patient declined, Type of Advance Directive: [place];Living will;Out of facility DNR (pink MOST or yellow form), Does patient want to make changes to medical advance directive?: No - Patient declined  Allergies  Allergen Reactions   Penicillin G Benzathine & Proc Other (See Comments)    Unknown.    Chief Complaint  Patient presents with   Acute Visit    Patient complains of sore throat that's been going on for 6 days.     HPI: Patient is a 81 y.o. female seen today for acute visit due to sore throat.   Tested positive for covid 07/01/2021. She reports taking paxlovid and symptoms resolved. Sore throat began 6-7 days ago. Pain described as mild, rated 3/10. She is able to eat and drink without problem. She has tried salt water gargles, ibuprofen and lozenges without success. Denies fever or body aches. Admits to some fatigue and nasal congestion.   Her anxiety at night has improved. She is still taking ativan for sleep. Discussed treatment options. Advised to try melatonin.   Review of Systems:  Review of Systems  Constitutional:  Positive for malaise/fatigue. Negative for chills, fever and weight loss.  HENT:  Positive for congestion and sore throat. Negative for ear discharge, ear pain, sinus pain and tinnitus.   Respiratory:  Negative for cough, shortness of breath and wheezing.   Cardiovascular:  Negative for chest pain and leg swelling.  Gastrointestinal:  Negative for nausea and vomiting.  Musculoskeletal:  Negative for myalgias.  Skin: Negative.   Psychiatric/Behavioral:  Negative for depression. The patient is nervous/anxious and has  insomnia.    Past Medical History:  Diagnosis Date   Anxiety and depression    History of repair of mitral valve    Hypothyroid    Past Surgical History:  Procedure Laterality Date   ABDOMINAL HYSTERECTOMY  1974   Dr. Etta Grandchild, [facility] [place]   MITRAL VALVE REPAIR  2018   Dr. Lunette Stands, [place]   Social History:   reports that she has never smoked. She has never used smokeless tobacco. She reports current alcohol use. No history on file for drug use.  Family History  Problem Relation Age of Onset   Parkinson's disease Mother    Dementia Mother    Prostate cancer Father    Stroke Sister    Anxiety disorder Brother    Depression Brother    Panic disorder Brother     Medications: Patient's Medications  New Prescriptions   No medications on file  Previous Medications   ASCORBIC ACID (VITAMIN C) 100 MG TABLET    Take 100 mg by mouth daily.   ASPIRIN 81 MG CHEWABLE TABLET    Chew by mouth daily.   B COMPLEX VITAMINS TABLET    Take 1 tablet by mouth daily.   CALCIUM-VITAMIN D (OSCAL WITH D) 250-125 MG-UNIT TABLET    Take 1 tablet by mouth daily. Dose unknown-Patient to find out.   CHOLECALCIFEROL (VITAMIN D3) 25 MCG (1000 UT) TABLET    Take 1,000 Units by mouth daily.   GABAPENTIN (NEURONTIN) 100 MG CAPSULE    TAKE  TWO CAPSULES BY MOUTH DAILY AT BEDTIME   LEVOTHYROXINE (SYNTHROID) 25 MCG TABLET    PLEASE SEE ATTACHED FOR DETAILED DIRECTIONS   LORAZEPAM (ATIVAN) 0.5 MG TABLET    TAKE 0.5-1 TABLETS (0.25-0.5 MG TOTAL) BY MOUTH AT BEDTIME AS NEEDED FOR ANXIETY.   MAGNESIUM GLYCINATE PO    Take 2 capsules by mouth daily.    MELOXICAM (MOBIC) 7.5 MG TABLET    Take 1 tablet (7.5 mg total) by mouth daily.   MINOXIDIL 5 % FOAM    Apply to affected areas twice a day   SERTRALINE (ZOLOFT) 50 MG TABLET    TAKE 1/2 TABLET BY MOUTH DAILY  Modified Medications   No medications on file  Discontinued Medications   CEPHALEXIN (KEFLEX)  500 MG CAPSULE    Prior the dental visits.   CYCLOBENZAPRINE (FLEXERIL) 5 MG TABLET    Take 1 tablet (5 mg total) by mouth at bedtime as needed for muscle spasms.   HYDROCORTISONE (ANUSOL-HC) 2.5 % RECTAL CREAM    PLACE 1 APPLICATION RECTALLY 2 (TWO) TIMES DAILY.    Physical Exam:  Vitals:   07/31/21 1435  BP: 131/84  Pulse: 86  Resp: 16  Temp: (!) 97.4 F (36.3 C)  SpO2: 91%  Weight: 140 lb 8 oz (63.7 kg)  Height: 5\' 3"  (1.6 m)   Body mass index is 24.89 kg/m. Wt Readings from Last 3 Encounters:  07/31/21 140 lb 8 oz (63.7 kg)  04/17/21 138 lb (62.6 kg)  02/01/21 134 lb 12.8 oz (61.1 kg)    Physical Exam Vitals reviewed.  Constitutional:      General: She is not in acute distress. HENT:     Head: Normocephalic.     Right Ear: There is no impacted cerumen.     Left Ear: There is no impacted cerumen.     Nose: Nose normal. No congestion or rhinorrhea.     Right Turbinates: Not enlarged or swollen.     Left Turbinates: Not enlarged or swollen.     Mouth/Throat:     Mouth: Mucous membranes are moist.     Pharynx: Uvula midline. Posterior oropharyngeal erythema present. No uvula swelling.     Tonsils: No tonsillar exudate or tonsillar abscesses.     Comments: Erythema R>L Eyes:     General:        Right eye: No discharge.        Left eye: No discharge.  Cardiovascular:     Rate and Rhythm: Normal rate and regular rhythm.     Pulses: Normal pulses.     Heart sounds: Normal heart sounds. No murmur heard. Pulmonary:     Effort: Pulmonary effort is normal. No respiratory distress.     Breath sounds: Normal breath sounds. No wheezing.  Lymphadenopathy:     Cervical: No cervical adenopathy.  Skin:    General: Skin is warm and dry.     Capillary Refill: Capillary refill takes less than 2 seconds.  Neurological:     General: No focal deficit present.     Mental Status: She is alert and oriented to person, place, and time.  Psychiatric:        Mood and Affect: Mood  normal.        Behavior: Behavior normal.   Labs reviewed: Basic Metabolic Panel: Recent Labs    10/04/20 0740  NA 141  K 4.9  CL 105  CO2 28  GLUCOSE 80  BUN 18  CREATININE 0.96*  CALCIUM 9.4  TSH 2.24   Liver Function Tests: Recent Labs    10/04/20 0740  AST 20  ALT 13  BILITOT 0.6  PROT 7.0   No results for input(s): LIPASE, AMYLASE in the last 8760 hours. No results for input(s): AMMONIA in the last 8760 hours. CBC: Recent Labs    10/04/20 0740  WBC 4.9  NEUTROABS 2,205  HGB 15.5  HCT 46.1*  MCV 94.7  PLT 293   Lipid Panel: Recent Labs    10/04/20 0740  CHOL 179  HDL 60  LDLCALC 95  TRIG 139  CHOLHDL 3.0   TSH: Recent Labs    10/04/20 0740  TSH 2.24   A1C: No results found for: HGBA1C   Assessment/Plan 1. Sore throat - mild erythema, R>L - afebrile, eating/drinking well - suspect viral or from nasal drainage - cont salt water gargles, warm fluids, lozenges - predniSONE (DELTASONE) 10 MG tablet; Take 4 tablets (40 mg total) by mouth daily with breakfast for 1 day, THEN 3 tablets (30 mg total) daily with breakfast for 1 day, THEN 2 tablets (20 mg total) daily with breakfast for 1 day, THEN 1 tablet (10 mg total) daily with breakfast for 1 day, THEN 0.5 tablets (5 mg total) daily with breakfast for 1 day.  Dispense: 10.5 tablet; Refill: 0 - if symptoms do not improve advised to f/u at office to have rapid step test done  2. Nasal congestion -exam unremarkable - advised to try antihistamine like Claritin or Zyrtec  3. Insomnia secondary to depression with anxiety - anxiety improved - cont zoloft - recommend melatonin 3 mg po qhs x 7 days, may go up to 5 mg   Total time: 22 minutes. Greater than 50% of total time spent doing patient education regarding symptom/medication management of sore throat   Next appt: none Cantrell Larouche Anibal Henderson  [facility] [phone]

## 2021-07-31 NOTE — Patient Instructions (Signed)
Melatonin 3 mg or 5 mg to help with sleep

## 2021-08-03 ENCOUNTER — Non-Acute Institutional Stay: Payer: Medicare HMO | Admitting: Nurse Practitioner

## 2021-08-03 ENCOUNTER — Encounter: Payer: Medicare HMO | Admitting: Nurse Practitioner

## 2021-08-03 ENCOUNTER — Encounter: Payer: Self-pay | Admitting: Nurse Practitioner

## 2021-08-03 ENCOUNTER — Other Ambulatory Visit: Payer: Self-pay

## 2021-08-03 VITALS — BP 118/72 | HR 57 | Temp 97.9°F | Ht 63.0 in | Wt 139.0 lb

## 2021-08-03 DIAGNOSIS — F5105 Insomnia due to other mental disorder: Secondary | ICD-10-CM

## 2021-08-03 DIAGNOSIS — J069 Acute upper respiratory infection, unspecified: Secondary | ICD-10-CM

## 2021-08-03 DIAGNOSIS — N1831 Chronic kidney disease, stage 3a: Secondary | ICD-10-CM | POA: Diagnosis not present

## 2021-08-03 DIAGNOSIS — E785 Hyperlipidemia, unspecified: Secondary | ICD-10-CM

## 2021-08-03 DIAGNOSIS — Z1159 Encounter for screening for other viral diseases: Secondary | ICD-10-CM

## 2021-08-03 DIAGNOSIS — Z820 Family history of epilepsy and other diseases of the nervous system: Secondary | ICD-10-CM | POA: Diagnosis not present

## 2021-08-03 DIAGNOSIS — I129 Hypertensive chronic kidney disease with stage 1 through stage 4 chronic kidney disease, or unspecified chronic kidney disease: Secondary | ICD-10-CM

## 2021-08-03 DIAGNOSIS — R69 Illness, unspecified: Secondary | ICD-10-CM | POA: Diagnosis not present

## 2021-08-03 DIAGNOSIS — F418 Other specified anxiety disorders: Secondary | ICD-10-CM

## 2021-08-03 DIAGNOSIS — M5136 Other intervertebral disc degeneration, lumbar region: Secondary | ICD-10-CM | POA: Diagnosis not present

## 2021-08-03 DIAGNOSIS — L659 Nonscarring hair loss, unspecified: Secondary | ICD-10-CM

## 2021-08-03 DIAGNOSIS — M8580 Other specified disorders of bone density and structure, unspecified site: Secondary | ICD-10-CM

## 2021-08-03 DIAGNOSIS — M51369 Other intervertebral disc degeneration, lumbar region without mention of lumbar back pain or lower extremity pain: Secondary | ICD-10-CM

## 2021-08-03 NOTE — Assessment & Plan Note (Signed)
inj 07/2019, Gabapentin, Meloxicam ? Restless legs, nighmares

## 2021-08-03 NOTE — Assessment & Plan Note (Signed)
The patient stated her mother had Alzheimer's dementia. Stated she has noticed her memory has issues. Will MMSE in 2 weeks. May consider PET scan for evaluate amyloid deposits.

## 2021-08-03 NOTE — Assessment & Plan Note (Signed)
LDL 95 10/04/20, takes Pravastatin

## 2021-08-03 NOTE — Assessment & Plan Note (Signed)
Stable, continue  prn Lorazepam, Sertraline.

## 2021-08-03 NOTE — Assessment & Plan Note (Addendum)
Sore throat/nasal congestion, OTC antihistamine, Prednisone taper dose since 07/31/21, Sore spot lateral left throat, in white color. No redness in throat, denied trouble swallowing,  cough, SOB, phlegm production, or fever.

## 2021-08-03 NOTE — Progress Notes (Signed)
Location:   clinic [place]   Place of Service:  Clinic (12) Provider: Marlana Latus NP  Code Status: DNR Goals of Care: [place] Advanced Directives 08/03/2021  Does Patient Have a Medical Advance Directive? Yes  Type of Advance Directive Out of facility DNR (pink MOST or yellow form)  Does patient want to make changes to medical advance directive? No - Patient declined  Copy of [place] in Chart? -  Would patient like information on creating a medical advance directive? -  Pre-existing out of facility DNR order (yellow form or pink MOST form) Pink MOST form placed in chart (order not valid for inpatient use)     Chief Complaint  Patient presents with   Medical Management of Chronic Issues    6 month follow-up, sign treatment agreement for Lorazepam, and discuss need for DEXA, Hep C Screening, flu vaccine and covid boosters or post pone if patient refuses. Patient c/o sore throat.     HPI: Patient is a 81 y.o. female seen today for medical management of chronic diseases.    Sore throat/nasal congestion, OTC antihistamine, Prednisone taper dose since 07/31/21, Sore spot lateral left throat, in white color.   S/p Covid infection 3 weeks ago, lost taste and decreased smell.    HTN ASA 53m qd, Bun/creat 18/0.96 10/04/20             Ocular migraine stable.              Hypothyroidism Levothyroxine, TSH 2.24 10/04/20             Depression/anxieyt, prn Lorazepam, Sertraline. Denied depression.              CKD Bun/creat 18/0.96 eGFR 57 10/04/20             S/p Mitral valve repair             Chronic lower back pain, inj 07/2019, Gabapentin, Meloxicam ? Restless legs, nighmares             Hyperlipidemia, LDL 95 10/04/20, takes Pravastatin              Osteopenia, no recent fx.  Past Medical History:  Diagnosis Date   Anxiety and depression    History of repair of mitral valve    Hypothyroid     Past Surgical History:  Procedure Laterality Date   [facility]   [facility] [place]   [place] VALVE [place]PA[place]  [place]    Allergies  Allergen Reactions   Penicillin G Benzathine & Proc Other (See Comments)    Unknown.    Allergies as of 08/03/2021       Reactions   Penicillin G Benzathine & Proc Other (See Comments)   Unknown.        Medication List        Accurate as of August 03, 2021 11:59 PM. If you have any questions, ask your nurse or doctor.          STOP taking these medications    meloxicam 7.5 MG tablet Commonly known as: MOBIC Stopped by: Cara Aguino X Sherrelle Prochazka, NP   Minoxidil 5 % Foam Stopped by: Veronda Gabor X Susan Arana, NP       TAKE these medications    aspirin 81 MG chewable tablet Chew by mouth daily.   b complex vitamins tablet Take 1  tablet by mouth daily.   calcium-vitamin D 250-125 MG-UNIT tablet Commonly known as: OSCAL WITH D Take 1 tablet by mouth daily. Dose unknown-Patient to find out.   cholecalciferol 25 MCG (1000 UNIT) tablet Commonly known as: VITAMIN D3 Take 1,000 Units by mouth daily.   gabapentin 100 MG capsule Commonly known as: NEURONTIN TAKE TWO CAPSULES BY MOUTH DAILY AT BEDTIME   levothyroxine 25 MCG tablet Commonly known as: SYNTHROID daily before breakfast. 1.5 tablets daily What changed: Another medication with the same name was removed. Continue taking this medication, and follow the directions you see here. Changed by: Kayal Mula X Eula Mazzola, NP   LORazepam 0.5 MG tablet Commonly known as: ATIVAN TAKE 0.5-1 TABLETS (0.25-0.5 MG TOTAL) BY MOUTH AT BEDTIME AS NEEDED FOR ANXIETY.   MAGNESIUM GLYCINATE PO Take 2 capsules by mouth daily.   predniSONE 10 MG tablet Commonly known as: DELTASONE Take 4 tablets (40 mg total) by mouth daily with breakfast for 1 day, THEN 3 tablets (30 mg total) daily with breakfast for 1 day, THEN 2 tablets (20 mg total) daily with breakfast for 1 day, THEN 1 tablet (10 mg total) daily  with breakfast for 1 day, THEN 0.5 tablets (5 mg total) daily with breakfast for 1 day. Start taking on: July 31, 2021   sertraline 50 MG tablet Commonly known as: ZOLOFT TAKE 1/2 TABLET BY MOUTH DAILY   vitamin C 100 MG tablet Take 100 mg by mouth daily.        Review of Systems:  Review of Systems  Constitutional:  Negative for fatigue, fever and unexpected weight change.  HENT:  Negative for congestion and voice change.   Eyes:  Negative for visual disturbance.       Dry eyes  Respiratory:  Negative for cough, shortness of breath and wheezing.   Gastrointestinal:  Negative for abdominal pain, constipation, nausea and vomiting.       Hemorrhoidal external, blood on tissue and pain  Genitourinary:  Negative for dysuria and frequency.  Musculoskeletal:  Positive for back pain.       No pain presently.   Skin:  Negative for color change.       Hair thinning on top of head.   Neurological:  Negative for speech difficulty, weakness and headaches.       Memory lapses.   Psychiatric/Behavioral:  Positive for sleep disturbance. Negative for confusion. The patient is not nervous/anxious.        Occasional    Health Maintenance  Topic Date Due   Hepatitis C Screening  Never done   Zoster Vaccines- Shingrix (1 of 2) Never done   DEXA SCAN  Never done   COVID-19 Vaccine (4 - Booster) 05/30/2021   TETANUS/TDAP  08/15/2021   Pneumonia Vaccine 4+ Years old  Completed   INFLUENZA VACCINE  Completed   HPV VACCINES  Aged Out    Physical Exam: Vitals:   08/03/21 1502  BP: 118/72  Pulse: (!) 57  Temp: 97.9 F (36.6 C)  TempSrc: Temporal  SpO2: 98%  Weight: 139 lb (63 kg)  Height: _0  (1.6 m)   Body mass index is 24.62 kg/m. Physical Exam Vitals and nursing note reviewed.  Constitutional:      Appearance: Normal appearance.  HENT:     Head: Normocephalic and atraumatic.     Nose: Nose normal.     Mouth/Throat:     Mouth: Mucous membranes are moist.     Pharynx:  No oropharyngeal exudate or  posterior oropharyngeal erythema.     Comments: Sore spot lateral left throat, in white color.  Eyes:     Conjunctiva/sclera: Conjunctivae normal.     Pupils: Pupils are equal, round, and reactive to light.     Comments: dry eyes  Cardiovascular:     Rate and Rhythm: Normal rate and regular rhythm.     Heart sounds: No murmur heard. Pulmonary:     Effort: Pulmonary effort is normal.     Breath sounds: No rales.  Abdominal:     General: Bowel sounds are normal.     Palpations: Abdomen is soft.     Tenderness: There is no abdominal tenderness.  Genitourinary:    Comments: Hemorrhoids external. Breast exam  Musculoskeletal:     Cervical back: Normal range of motion and neck supple.     Right lower leg: No edema.     Left lower leg: No edema.  Skin:    General: Skin is warm and dry.  Neurological:     General: No focal deficit present.     Mental Status: She is alert and oriented to person, place, and time. Mental status is at baseline.     Gait: Gait normal.  Psychiatric:        Mood and Affect: Mood normal.        Behavior: Behavior normal.        Thought Content: Thought content normal.        Judgment: Judgment normal.    Labs reviewed: Basic Metabolic Panel: Recent Labs    10/04/20 0740  NA 141  K 4.9  CL 105  CO2 28  GLUCOSE 80  BUN 18  CREATININE 0.96*  CALCIUM 9.4  TSH 2.24   Liver Function Tests: Recent Labs    10/04/20 0740  AST 20  ALT 13  BILITOT 0.6  PROT 7.0   No results for input(s): LIPASE, AMYLASE in the last 8760 hours. No results for input(s): AMMONIA in the last 8760 hours. CBC: Recent Labs    10/04/20 0740  WBC 4.9  NEUTROABS 2,205  HGB 15.5  HCT 46.1*  MCV 94.7  PLT 293   Lipid Panel: Recent Labs    10/04/20 0740  CHOL 179  HDL 60  LDLCALC 95  TRIG 139  CHOLHDL 3.0   No results found for: HGBA1C  Procedures since last visit: No results found.  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   Lumbar degenerative disc disease  inj 07/2019, Gabapentin, Meloxicam ? Restless legs, nighmares  Hyperlipidemia LDL 95 10/04/20, takes Pravastatin   Osteopenia No recent fx  URI (upper respiratory infection) Sore throat/nasal congestion, OTC antihistamine, Prednisone taper dose since 07/31/21, Sore spot lateral left throat, in white color. No redness in throat, denied trouble swallowing,  cough, SOB, phlegm production, or fever.  Family history of Alzheimer's disease The patient stated her mother had Alzheimer's dementia. Stated she has noticed her memory has issues. Will MMSE in 2 weeks. May consider PET scan for evaluate amyloid deposits.   Hair thinning Discussed Minoxidil B vs R in setting of hair thinning. Will update CBC/diff, CMP/eGFR, TSH, lipid panel.    Labs/tests ordered: CBC/diff, CMP/eGFR, TSH, Lipid panel. Hepatitis C screening, DEXA.   Next appt:  AWV in 2 weeks, f/u in clinic [facility] 3 months.

## 2021-08-03 NOTE — Assessment & Plan Note (Signed)
No recent fx

## 2021-08-03 NOTE — Assessment & Plan Note (Signed)
Discussed Minoxidil B vs R in setting of hair thinning. Will update CBC/diff, CMP/eGFR, TSH, lipid panel.

## 2021-08-03 NOTE — Assessment & Plan Note (Signed)
Blood pressure is controlled, no meds, continue ASA 81mg  qd, Bun/creat 18/0.96 10/04/20

## 2021-08-07 ENCOUNTER — Encounter: Payer: Self-pay | Admitting: Nurse Practitioner

## 2021-08-14 DIAGNOSIS — R0609 Other forms of dyspnea: Secondary | ICD-10-CM | POA: Diagnosis not present

## 2021-08-14 DIAGNOSIS — Z9889 Other specified postprocedural states: Secondary | ICD-10-CM | POA: Diagnosis not present

## 2021-08-14 DIAGNOSIS — Z880 Allergy status to penicillin: Secondary | ICD-10-CM | POA: Diagnosis not present

## 2021-08-17 ENCOUNTER — Encounter: Payer: Medicare HMO | Admitting: Nurse Practitioner

## 2021-08-24 ENCOUNTER — Other Ambulatory Visit: Payer: Self-pay

## 2021-08-24 ENCOUNTER — Encounter: Payer: Self-pay | Admitting: Nurse Practitioner

## 2021-08-24 ENCOUNTER — Non-Acute Institutional Stay (INDEPENDENT_AMBULATORY_CARE_PROVIDER_SITE_OTHER): Payer: Medicare HMO | Admitting: Nurse Practitioner

## 2021-08-24 VITALS — BP 120/82 | HR 61 | Temp 98.6°F | Ht 63.0 in | Wt 141.0 lb

## 2021-08-24 DIAGNOSIS — Z0000 Encounter for general adult medical examination without abnormal findings: Secondary | ICD-10-CM

## 2021-08-24 NOTE — Progress Notes (Signed)
Subjective:   Kristine [name] is a 81 y.o. female who presents for Medicare Annual (Subsequent) preventive examination in the clinic at [facility] ([facility]  Cardiac Risk Factors include: advanced age (>81men, >31 women);family history of premature cardiovascular disease (father had heart disease. the patient had mitral valve repair.)     Objective:    Today's Vitals   08/24/21 1334 08/24/21 1354  BP: 120/82   Pulse: 61   Temp: 98.6 F (37 C)   SpO2: 98%   Weight: 141 lb (64 kg)   Height: 5\' 3"  (1.6 m)   PainSc:  6    Body mass index is 24.98 kg/m.  Advanced Directives 08/24/2021 08/03/2021 07/31/2021  Does Patient Have a Medical Advance Directive? - Yes Yes  Type of Paramedic of [place];Living will;Out of facility DNR (pink MOST or yellow form) Out of facility DNR (pink MOST or yellow form) [place];Living will;Out of facility DNR (pink MOST or yellow form)  Does patient want to make changes to medical advance directive? - No - Patient declined No - Patient declined  Copy of [place] in Chart? - - No - copy requested  Would patient like information on creating a medical advance directive? - - No - Patient declined  Pre-existing out of facility DNR order (yellow form or pink MOST form) Pink MOST form placed in chart (order not valid for inpatient use) Pink MOST form placed in chart (order not valid for inpatient use) -    Current Medications (verified) Outpatient Encounter Medications as of 08/24/2021  Medication Sig   Ascorbic Acid (VITAMIN C) 100 MG tablet Take 100 mg by mouth daily.   aspirin 81 MG chewable tablet Chew by mouth daily.   b complex vitamins tablet Take 1 tablet by mouth daily.   calcium-vitamin D (OSCAL WITH D) 250-125 MG-UNIT tablet Take 1 tablet by mouth daily. Dose unknown-Patient to find out.   cholecalciferol (VITAMIN D3) 25 MCG (1000 UT) tablet Take 1,000 Units by mouth daily.    gabapentin (NEURONTIN) 100 MG capsule TAKE TWO CAPSULES BY MOUTH DAILY AT BEDTIME   levothyroxine (SYNTHROID) 25 MCG tablet daily before breakfast. 1.5 tablets daily   LORazepam (ATIVAN) 0.5 MG tablet TAKE 0.5-1 TABLETS (0.25-0.5 MG TOTAL) BY MOUTH AT BEDTIME AS NEEDED FOR ANXIETY.   MAGNESIUM GLYCINATE PO Take 2 capsules by mouth daily.    sertraline (ZOLOFT) 50 MG tablet TAKE 1/2 TABLET BY MOUTH DAILY   No facility-administered encounter medications on file as of 08/24/2021.    Allergies (verified) Penicillin g benzathine & proc   History: Past Medical History:  Diagnosis Date   Anxiety and depression    History of repair of mitral valve    Hypothyroid    Past Surgical History:  Procedure Laterality Date   ABDOMINAL HYSTERECTOMY  1974   Dr. Etta Grandchild, [facility] [place]   MITRAL VALVE REPAIR  2018   Dr. Lunette Stands, [place] [facility]   Family History  Problem Relation Age of Onset   Parkinson's disease Mother    Dementia Mother    Prostate cancer Father    Stroke Sister    High Cholesterol Sister    Anxiety disorder Brother    Depression Brother    Panic disorder Brother    Social History   Socioeconomic History   Marital status: Married    Spouse name: Not on file  Number of children: Not on file   Years of education: Not on file   Highest education level: Associate degree: occupational, technical, or vocational program  Occupational History   Not on file  Tobacco Use   Smoking status: Never   Smokeless tobacco: Never  Vaping Use   Vaping Use: Never used  Substance and Sexual Activity   Alcohol use: Yes    Comment: 1-2 drinks a month    Drug use: Never   Sexual activity: Not on file  Other Topics Concern   Not on file  Social History Narrative   Social History      Diet? No       Do you drink/eat things with caffeine? 1/2 cup coffee, tea       Marital status?     Married                               What year  were you married? 1965      Do you live in a house, apartment, assisted living, condo, trailer, etc.? Apartment       Is it one or more stories? No       How many persons live in your home? 2       Do you have any pets in your home? No       Highest level of education completed? 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         occasionaly                             Type & how often? Walking elliptical before covid       Advanced Directives      Do you have a living will? yes      Do you have a DNR form?                                  If not, do you want to discuss one? No       Do you have signed POA/HPOA for forms? yes      Functional Status      Do you have difficulty bathing or dressing yourself? No       Do you have difficulty preparing food or eating? No       Do you have difficulty managing your medications? No       Do you have difficulty managing your finances? No       Do you have difficulty affording your medications? No       Social Determinants of Radio broadcast assistant Strain: Not on file  Food Insecurity: Not on file  Transportation Needs: Not on file  Physical Activity: Not on file  Stress: Not on file  Social Connections: Not on file    Tobacco Counseling Counseling given: Not Answered   Clinical Intake:  Pre-visit preparation completed: Yes  Pain : 0-10 Pain Score: 6  Pain Location: Neck Pain Orientation: Mid Pain Descriptors / Indicators: Jabbing Pain Onset: More than a month ago Pain Frequency: Constant Pain Relieving Factors: lying down, taking Gabapentin ro relive pain. Effect of Pain on Daily  Activities: none  Pain Relieving Factors: lying down, taking Gabapentin ro relive pain.  BMI - recorded: 24.98 Nutritional Status: BMI of 19-24  Normal Nutritional Risks: None Diabetes: No  How often do you need to have  someone help you when you read instructions, pamphlets, or other written materials from your doctor or pharmacy?: 1 - Never What is the last grade level you completed in school?: 2 years college  Diabetic?no  Interpreter Needed?: No  Information entered by :: Nevae Pinnix Bretta Bang NP   Activities of Daily Living In your present state of health, do you have any difficulty performing the following activities: 08/24/2021 08/24/2021  Hearing? N N  Comment healing aids Wear hearing aids  Vision? N N  Comment - Wear glasses  Difficulty concentrating or making decisions? N N  Walking or climbing stairs? N N  Dressing or bathing? - N  Doing errands, shopping? N N  Preparing Food and eating ? N N  Using the Toilet? N N  In the past six months, have you accidently leaked urine? N N  Do you have problems with loss of bowel control? N N  Managing your Medications? N N  Managing your Finances? N N  Housekeeping or managing your Housekeeping? N N  Some recent data might be hidden    Patient Care Team: Wardell Honour, MD as PCP - General (Family Medicine)  Indicate any recent Medical Services you may have received from other than [facility] providers in the past year (date may be approximate).     Assessment:   This is a routine wellness examination for Kristine [name].  Hearing/Vision screen Hearing Screening - Comments:: Wear hearing aids and no hearing concerns  Vision Screening - Comments:: Wear eye glasses but no vision concerns  Dietary issues and exercise activities discussed: Current Exercise Habits: Home exercise routine;The patient does not participate in regular exercise at present (30 minutes walking, two times a day, not no sundays.), Type of exercise: walking, Time (Minutes): 30, Frequency (Times/Week): >7, Weekly Exercise (Minutes/Week): 0, Intensity: Moderate, Exercise limited by: orthopedic condition(s)   Goals Addressed             This Visit's Progress    Anxiety Symptoms Monitored and  Managed       Evidence-based guidance:  Discuss adherence to medication therapy; assess and manage barriers, such as costs, side effects, feeling little or no improvement, stigma or low motivation.  Discuss past experiences with psychotropic medication, potential side effects and need to continue medication until treatment becomes effective (e.g., 4 to 8 weeks).  Explain the interplay of stress and physical symptoms, as well as the relationship between illness and emotional problems.  Explore complementary and alternative therapy options, such as applied relaxation, mindfulness, meditation, music therapy, aromatherapy, acupuncture or massage.  Prepare patient for antidepressant pharmacologic therapy which may include additional short-term medications until maintenance medications demonstrate therapeutic effect.   Assess response; monitor and manage side effects.  Prepare patient for use of pharmacologic therapy (e.g., anxiolytic, selective serotonin-reuptake inhibitor, serotonin norepinephrine-reuptake inhibitor, tricyclic antidepressant, antiepileptic, antipsychotic for comorbid depression,   phosphodiesterase-inhibitor for sexual dysfunction, sedative or hypnotic for sleep disturbance); monitor and manage side effects.  Prepare patient for long-term pharmacologic therapy to prevent relapse (e.g., 12 months after symptom improvement).  Promote cognitive behavioral therapy, individualized to severity of symptoms [e.g., nonfacilitated self-help, facilitated (computerized or manual-based) self-help, face-to-face individual treatment].  Encourage patient to develop a self-management plan that identifies and manages lifestyle triggers (e.g., caffeine, stimulants,  nicotine, stress), improves sleep, exercise or physical activity based on tolerance.  Prepare patient for a referral to a psychiatrist when patient has a poor response to treatment, atypical presentation or comorbid psychiatric disorder.  Provide  frequent follow-up (e.g., every 2 weeks for the first 6 to 8 weeks of treatment and monthly thereafter).  Explore means to support work reintegration, such as modified working hours, peer support or coaching or a community jobs program.   Notes:        Depression Screen PHQ 2/9 Scores 08/24/2021 08/03/2021  PHQ - 2 Score 0 0    Fall Risk Fall Risk  08/24/2021 08/03/2021 07/31/2021 02/01/2021 10/06/2020  Falls in the past year? 0 0 0 0 0  Number falls in past yr: 0 0 0 0 0  Injury with Fall? 0 0 0 0 -  Risk for fall due to : History of fall(s) No Fall Risks No Fall Risks History of fall(s);No Fall Risks -  Follow up Falls evaluation completed;Education provided;Falls prevention discussed Falls evaluation completed Falls evaluation completed Falls evaluation completed;Education provided;Falls prevention discussed -    FALL RISK PREVENTION PERTAINING TO THE HOME:  Any stairs in or around the home? Yes  If so, are there any without handrails? No  Home free of loose throw rugs in walkways, pet beds, electrical cords, etc? Yes  Adequate lighting in your home to reduce risk of falls? Yes   ASSISTIVE DEVICES UTILIZED TO PREVENT FALLS:  Life alert? No  Use of a cane, walker or w/c? No  Grab bars in the bathroom? No  Shower chair or bench in shower? Yes  Elevated toilet seat or a handicapped toilet? No   TIMED UP AND GO:  Was the test performed? No .    Gait steady and fast with assistive device  Cognitive Function:     6CIT Screen 08/24/2021  What Year? 0 points  What month? 0 points  What time? 0 points  Count back from 20 0 points  Months in reverse 0 points  Repeat phrase 4 points  Total Score 4    Immunizations Immunization History  Administered Date(s) Administered   Hepatitis A 05/24/2000   Hepatitis A, Ped/Adol-2 Dose 05/24/2000   IPV 05/24/2000   Influenza, High Dose Seasonal PF 04/15/2021   Moderna SARS-COV2 Booster Vaccination 05/24/2020, 11/14/2020   Moderna  Sars-Covid-2 Vaccination 07/20/2019, 08/17/2019   Pfizer Covid-19 Vaccine Bivalent Booster 50yrs & up 04/04/2021   Pneumococcal Conjugate-13 10/21/2015   Pneumococcal Polysaccharide-23 07/12/2008   Tdap 08/16/2011   Zoster, Live 07/16/2009    TDAP status: Due, Education has been provided regarding the importance of this vaccine. Advised may receive this vaccine at local pharmacy or Health Dept. Aware to provide a copy of the vaccination record if obtained from local pharmacy or Health Dept. Verbalized acceptance and understanding.  Flu Vaccine status: Up to date  Pneumococcal vaccine status: Due, Education has been provided regarding the importance of this vaccine. Advised may receive this vaccine at local pharmacy or Health Dept. Aware to provide a copy of the vaccination record if obtained from local pharmacy or Health Dept. Verbalized acceptance and understanding.  Covid-19 vaccine status: Completed vaccines  Qualifies for Shingles Vaccine? Yes   Zostavax completed Yes   Shingrix Completed?: No.    Education has been provided regarding the importance of this vaccine. Patient has been advised to call insurance company to determine out of pocket expense if they have not yet received this vaccine. Advised may also  receive vaccine at local pharmacy or Health Dept. Verbalized acceptance and understanding.  Screening Tests Health Maintenance  Topic Date Due   DEXA SCAN  Never done   COVID-19 Vaccine (4 - Booster) 05/30/2021   Zoster Vaccines- Shingrix (1 of 2) 11/21/2021 (Originally 05/09/1960)   TETANUS/TDAP  11/28/2021 (Originally 08/15/2021)   Hepatitis C Screening  08/24/2022 (Originally 05/09/1959)   Pneumonia Vaccine 55+ Years old  Completed   INFLUENZA VACCINE  Completed   HPV VACCINES  Aged Out    Health Maintenance  Health Maintenance Due  Topic Date Due   DEXA SCAN  Never done   COVID-19 Vaccine (4 - Booster) 05/30/2021    Colorectal cancer screening: No longer  required.   Mammogram status: No longer required due to aged out..  Bone Density status: Ordered DEXA. Pt provided with contact info and advised to call to schedule appt.  Lung Cancer Screening: (Low Dose CT Chest recommended if Age 67-80 years, 30 pack-year currently smoking OR have quit w/in 15years.) does not qualify.   Additional Screening:  Hepatitis C Screening: does not qualify  Vision Screening: Recommended annual ophthalmology exams for early detection of glaucoma and other disorders of the eye. Is the patient up to date with their annual eye exam?  Yes  Who is the provider or what is the name of the office in which the patient attends annual eye exams? Dr. Amalia Hailey If pt is not established with a provider, would they like to be referred to a provider to establish care? No .   Dental Screening: Recommended annual dental exams for proper oral hygiene  Community Resource Referral / Chronic Care Management: CRR required this visit?  No   CCM required this visit?  No      Plan:   DEXA order sent  I have personally reviewed and noted the following in the patients chart:   Medical and social history Use of alcohol, tobacco or illicit drugs  Current medications and supplements including opioid prescriptions.  Functional ability and status Nutritional status Physical activity Advanced directives List of other physicians Hospitalizations, surgeries, and ER visits in previous 12 months Vitals Screenings to include cognitive, depression, and falls Referrals and appointments  In addition, I have reviewed and discussed with patient certain preventive protocols, quality metrics, and best practice recommendations. A written personalized care plan for preventive services as well as general preventive health recommendations were provided to patient.     Whitley Patchen X Waldon Sheerin, NP   08/24/2021

## 2021-08-29 DIAGNOSIS — D1801 Hemangioma of skin and subcutaneous tissue: Secondary | ICD-10-CM | POA: Diagnosis not present

## 2021-08-29 DIAGNOSIS — L814 Other melanin hyperpigmentation: Secondary | ICD-10-CM | POA: Diagnosis not present

## 2021-08-29 DIAGNOSIS — L821 Other seborrheic keratosis: Secondary | ICD-10-CM | POA: Diagnosis not present

## 2021-08-29 DIAGNOSIS — L570 Actinic keratosis: Secondary | ICD-10-CM | POA: Diagnosis not present

## 2021-08-29 DIAGNOSIS — L719 Rosacea, unspecified: Secondary | ICD-10-CM | POA: Diagnosis not present

## 2021-09-05 ENCOUNTER — Encounter: Payer: Self-pay | Admitting: Family Medicine

## 2021-09-05 ENCOUNTER — Other Ambulatory Visit: Payer: Self-pay

## 2021-09-05 ENCOUNTER — Ambulatory Visit (INDEPENDENT_AMBULATORY_CARE_PROVIDER_SITE_OTHER): Payer: Medicare HMO | Admitting: Family Medicine

## 2021-09-05 VITALS — BP 118/76 | HR 73 | Temp 96.9°F | Ht 63.0 in | Wt 137.4 lb

## 2021-09-05 DIAGNOSIS — J029 Acute pharyngitis, unspecified: Secondary | ICD-10-CM | POA: Diagnosis not present

## 2021-09-05 DIAGNOSIS — J312 Chronic pharyngitis: Secondary | ICD-10-CM

## 2021-09-05 DIAGNOSIS — F439 Reaction to severe stress, unspecified: Secondary | ICD-10-CM | POA: Diagnosis not present

## 2021-09-05 DIAGNOSIS — R69 Illness, unspecified: Secondary | ICD-10-CM | POA: Diagnosis not present

## 2021-09-05 MED ORDER — MAGIC MOUTHWASH: 5.0000 mL | Freq: Three times a day (TID) | ORAL | Status: DC

## 2021-09-05 MED ORDER — ESOMEPRAZOLE MAGNESIUM 40 MG PO CPDR: 40.0000 mg | DELAYED_RELEASE_CAPSULE | Freq: Every day | ORAL | 3 refills | Status: DC

## 2021-09-05 NOTE — Progress Notes (Signed)
Provider:  Alain Honey, MD  Careteam: Patient Care Team: Wardell Honour, MD as PCP - General (Family Medicine)  PLACE OF SERVICE:  [address]  Advanced Directive information    Allergies  Allergen Reactions   Penicillin G Benzathine & Proc Other (See Comments)    Unknown.    No chief complaint on file.    HPI: Patient is a 81 y.o. female she is complaining of a persistent sore throat.  She has been seen twice by her providers.  She has not had any fever or headache.  She did have COVID back in December and symptoms seem likely started with that infection.  She is accompanied by her daughter today who wants to have a culture done.  I explained that bacteria that would cause infection such as strep or mycoplasma would not be likely normal with viral agents. When asked about reflux she denies symptoms.  Also denies symptoms of drainage but also endorses fact that symptoms are worse in a.m. upon waking up.  She denies ear symptoms.  Review of Systems:  Review of Systems  HENT:  Positive for sore throat.   Respiratory: Negative.    Cardiovascular: Negative.   Musculoskeletal: Negative.   Psychiatric/Behavioral:  The patient is nervous/anxious.   All other systems reviewed and are negative.  Past Medical History:  Diagnosis Date   Anxiety and depression    History of repair of mitral valve    Hypothyroid    Past Surgical History:  Procedure Laterality Date   ABDOMINAL HYSTERECTOMY  1974   Dr. Etta Grandchild, [facility] [place]   MITRAL VALVE REPAIR  2018   Dr. Lunette Stands, [place]   Social History:   reports that she has never smoked. She has never used smokeless tobacco. She reports current alcohol use. She reports that she does not use drugs.  Family History  Problem Relation Age of Onset   Parkinson's disease Mother    Dementia Mother    Prostate cancer Father    Stroke Sister    High Cholesterol Sister     Anxiety disorder Brother    Depression Brother    Panic disorder Brother     Medications: Patient's Medications  New Prescriptions   No medications on file  Previous Medications   ASCORBIC ACID (VITAMIN C) 100 MG TABLET    Take 100 mg by mouth daily.   ASPIRIN 81 MG CHEWABLE TABLET    Chew by mouth daily.   B COMPLEX VITAMINS TABLET    Take 1 tablet by mouth daily.   CALCIUM-VITAMIN D (OSCAL WITH D) 250-125 MG-UNIT TABLET    Take 1 tablet by mouth daily. Dose unknown-Patient to find out.   CHOLECALCIFEROL (VITAMIN D3) 25 MCG (1000 UT) TABLET    Take 1,000 Units by mouth daily.   GABAPENTIN (NEURONTIN) 100 MG CAPSULE    TAKE TWO CAPSULES BY MOUTH DAILY AT BEDTIME   LEVOTHYROXINE (SYNTHROID) 25 MCG TABLET    daily before breakfast. 1.5 tablets daily   LORAZEPAM (ATIVAN) 0.5 MG TABLET    TAKE 0.5-1 TABLETS (0.25-0.5 MG TOTAL) BY MOUTH AT BEDTIME AS NEEDED FOR ANXIETY.   MAGNESIUM GLYCINATE PO    Take 2 capsules by mouth daily.    SERTRALINE (ZOLOFT) 50 MG TABLET    TAKE 1/2 TABLET BY MOUTH DAILY  Modified Medications   No medications on file  Discontinued Medications   No medications on file  Physical Exam:  There were no vitals filed for this visit. There is no height or weight on file to calculate BMI. Wt Readings from Last 3 Encounters:  08/24/21 141 lb (64 kg)  08/03/21 139 lb (63 kg)  07/31/21 140 lb 8 oz (63.7 kg)    Physical Exam Vitals and nursing note reviewed.  Constitutional:      Appearance: Normal appearance.  HENT:     Head: Normocephalic.     Right Ear: Tympanic membrane normal.     Left Ear: Tympanic membrane normal.     Mouth/Throat:     Mouth: Mucous membranes are dry.     Pharynx: Oropharynx is clear. No posterior oropharyngeal erythema.  Musculoskeletal:     Cervical back: Normal range of motion. No tenderness.  Neurological:     General: No focal deficit present.     Mental Status: She is alert and oriented to person, place, and time.   Psychiatric:        Mood and Affect: Mood normal.    Labs reviewed: Basic Metabolic Panel: Recent Labs    10/04/20 0740  NA 141  K 4.9  CL 105  CO2 28  GLUCOSE 80  BUN 18  CREATININE 0.96*  CALCIUM 9.4  TSH 2.24   Liver Function Tests: Recent Labs    10/04/20 0740  AST 20  ALT 13  BILITOT 0.6  PROT 7.0   No results for input(s): LIPASE, AMYLASE in the last 8760 hours. No results for input(s): AMMONIA in the last 8760 hours. CBC: Recent Labs    10/04/20 0740  WBC 4.9  NEUTROABS 2,205  HGB 15.5  HCT 46.1*  MCV 94.7  PLT 293   Lipid Panel: Recent Labs    10/04/20 0740  CHOL 179  HDL 60  LDLCALC 95  TRIG 139  CHOLHDL 3.0   TSH: Recent Labs    10/04/20 0740  TSH 2.24   A1C: No results found for: HGBA1C   Assessment/Plan  1. Sore throat This is not likely infectious although cannot rule out yeast, as with thrush.  We will obtain aerobic culture to satisfy daughter's question Treat with Magic mouthwash.  Also add PPI for a 2-week trial refer to ENT if no improvement obtained.    4. Situational stress She continues to take lorazepam on a as needed basis as well as low-dose sertraline.  Have encouraged continuation   Alain Honey, MD [place] [phone]

## 2021-09-05 NOTE — Addendum Note (Signed)
Addended by: Dan Maker on: 09/05/2021 03:21 PM   Modules accepted: Orders

## 2021-09-07 LAB — CULTURE, UPPER RESPIRATORY
MICRO NUMBER:: 13038528
SPECIMEN QUALITY:: ADEQUATE

## 2021-09-12 ENCOUNTER — Other Ambulatory Visit: Payer: Self-pay | Admitting: Family Medicine

## 2021-09-12 DIAGNOSIS — J029 Acute pharyngitis, unspecified: Secondary | ICD-10-CM

## 2021-09-20 NOTE — Progress Notes (Signed)
? ? Kristine [name] ?[name] ?[name] ?Phone: [phone] ?  ?Assessment and Plan:   ?  ?1. Neck pain ?2.  Somatic dysfunction of cervical region ?4. Somatic dysfunction of thoracic region ?5. Somatic dysfunction of rib region ?-Chronic with exacerbation, subsequent sports medicine visit ?- Recurrence of chronic neck pain that was overall improving with meloxicam course and HEP after visit in 04/2021, however has been recurring for the past 2 months ?- Start meloxicam 15 mg daily x2 weeks.  If still having pain after 2 weeks, complete 3rd-week of meloxicam. May use remaining meloxicam as needed once daily for pain control.  Do not to use additional NSAIDs while taking meloxicam.  May use Tylenol [phone] mg 2 to 3 times a day for breakthrough pain. ?- Restart HEP for neck ?- Patient elected to trial first OMT today.  Tolerated well per note below. ?- Decision today to treat with OMT was based on Physical Exam ? ?After verbal consent patient was treated with HVLA (high velocity low amplitude), ME (muscle energy), FPR (flex positional release), ST (soft tissue), PC/PD (Pelvic Compression/ Pelvic Decompression) techniques in cervical, rib, thoracic, areas. Patient tolerated the procedure well with improvement in symptoms.  Patient educated on potential side effects of soreness and recommended to rest, hydrate, and use Tylenol as needed for pain control.  ?  ?2. Chronic right-sided low back pain without sciatica ?-Chronic with exacerbation, subsequent sports medicine visit ?- Recurrence of low back pain concentrated on right side that is similar to previous pain, however right-sided radicular pains have not restarted like they have in the past ?- Patient received nearly 100% improvement in symptoms after right-sided L5-S1 epidural in 11/2020, and patient wishes to repeat procedure.  Repeat procedure ordered ?- DG INJECT DIAG/THERA/INC NEEDLE/CATH/PLC  EPI/LUMB/SAC W/IMG; Future ? ? ?Pertinent previous records reviewed include none ?  ?Follow Up: 2 weeks after epidural to review effectiveness.  Could consider repeat OMT at that time if today's was beneficial ?  ?Subjective:   ?I, Kristine [name], am serving as a Education administrator for Doctor Kristine [name] ? ?Chief Complaint: low back pain  ? ?HPI:  ?04/17/21 ?Patient is a 81 year old female presenting with right sided back pain after taking a nap after church yesterday and rolled out of bed. Patient states that it feels like there is something "rubbing" in that area causing the pain. Pain has just started to radiate to mid back. Patient states that the pain is constant and sharp Has used heat to help with the pain. Patient can not find any position to help ease the pain.  ?  ?09/21/2021 ?Patient states her low back on the right side thinks its time for another shot , states she is going to travel this summer, the worst part is her neck, hurts more on the right side goes up into her whole head ,doesn't notice it as bad on the left side has no MOI did have COVID only the last 3 weeks that she has felt good , head heart surgery to repair a valve  ? ?  ?Relevant Historical Information: Lumbar radiculopathy, compression fracture L4, osteopenia, DDD ? ?Additional pertinent review of systems negative. ? ? ?Current Outpatient Medications:  ?  Ascorbic Acid (VITAMIN C) 100 MG tablet, Take 100 mg by mouth daily., Disp: , Rfl:  ?  aspirin 81 MG chewable tablet, Chew by mouth daily., Disp: , Rfl:  ?  b complex vitamins tablet, Take  1 tablet by mouth daily., Disp: , Rfl:  ?  calcium-vitamin D (OSCAL WITH D) 250-125 MG-UNIT tablet, Take 1 tablet by mouth daily. Dose unknown-Patient to find out., Disp: , Rfl:  ?  cholecalciferol (VITAMIN D3) 25 MCG (1000 UT) tablet, Take 1,000 Units by mouth daily., Disp: , Rfl:  ?  esomeprazole (NEXIUM) 40 MG capsule, Take 1 capsule (40 mg total) by mouth daily., Disp: 15 capsule, Rfl: 3 ?  gabapentin  (NEURONTIN) 100 MG capsule, TAKE TWO CAPSULES BY MOUTH DAILY AT BEDTIME, Disp: 180 capsule, Rfl: 0 ?  levothyroxine (SYNTHROID) 25 MCG tablet, daily before breakfast. 1.5 tablets daily, Disp: , Rfl:  ?  LORazepam (ATIVAN) 0.5 MG tablet, TAKE 0.5-1 TABLETS (0.25-0.5 MG TOTAL) BY MOUTH AT BEDTIME AS NEEDED FOR ANXIETY., Disp: 30 tablet, Rfl: 0 ?  MAGNESIUM GLYCINATE PO, Take 2 capsules by mouth daily. , Disp: , Rfl:  ?  meloxicam (MOBIC) 15 MG tablet, Take 1 tablet (15 mg total) by mouth daily., Disp: 30 tablet, Rfl: 0 ?  sertraline (ZOLOFT) 50 MG tablet, TAKE 1/2 TABLET BY MOUTH DAILY, Disp: 45 tablet, Rfl: 1 ? ?Current Facility-Administered Medications:  ?  magic mouthwash, 5 mL, Oral, TID, Wardell Honour, MD  ? ?Objective:   ?  ?Vitals:  ? 09/21/21 0947  ?BP: 120/80  ?Pulse: 62  ?SpO2: (!) 89%  ?Weight: 146 lb (66.2 kg)  ?Height: '5\' 3"'$  (1.6 m)  ?  ?  ?Body mass index is 25.86 kg/m?.  ?  ?Physical Exam:   ? ?Gen: Appears well, nad, nontoxic and pleasant ?Psych: Alert and oriented, appropriate mood and affect ?Neuro: sensation intact, strength is 5/5 in upper and lower extremities, muscle tone wnl ?Skin: no susupicious lesions or rashes ? ?Back - Normal skin, Spine with normal alignment and no deformity.   ?No tenderness to vertebral process palpation.   ?Lumbar paraspinous muscles are mild tender and without spasm ?Straight leg raise negative ? ?General: Well-appearing, cooperative, sitting comfortably in no acute distress.  ? ?OMT Physical Exam: ?  ?Cervical: TTP paraspinal, significantly limited sidebending bilaterally ?Rib: Bilateral elevated first rib with TTP, worse on right ?Thoracic: TTP paraspinal, T3 RRSR, T6 RLSL ?  ? ?Electronically signed by:  ?Kristine [name] ?[facility] ?10:20 AM 09/21/21 ?

## 2021-09-21 ENCOUNTER — Ambulatory Visit (INDEPENDENT_AMBULATORY_CARE_PROVIDER_SITE_OTHER): Payer: Medicare HMO | Admitting: Sports Medicine

## 2021-09-21 ENCOUNTER — Other Ambulatory Visit: Payer: Self-pay

## 2021-09-21 ENCOUNTER — Other Ambulatory Visit: Payer: Self-pay | Admitting: Nurse Practitioner

## 2021-09-21 VITALS — BP 120/80 | HR 62 | Ht 63.0 in | Wt 146.0 lb

## 2021-09-21 DIAGNOSIS — M9902 Segmental and somatic dysfunction of thoracic region: Secondary | ICD-10-CM

## 2021-09-21 DIAGNOSIS — M545 Low back pain, unspecified: Secondary | ICD-10-CM

## 2021-09-21 DIAGNOSIS — M9908 Segmental and somatic dysfunction of rib cage: Secondary | ICD-10-CM | POA: Diagnosis not present

## 2021-09-21 DIAGNOSIS — G8929 Other chronic pain: Secondary | ICD-10-CM | POA: Diagnosis not present

## 2021-09-21 DIAGNOSIS — M542 Cervicalgia: Secondary | ICD-10-CM

## 2021-09-21 DIAGNOSIS — M9901 Segmental and somatic dysfunction of cervical region: Secondary | ICD-10-CM | POA: Diagnosis not present

## 2021-09-21 MED ORDER — MELOXICAM 15 MG PO TABS: 15.0000 mg | ORAL_TABLET | Freq: Every day | ORAL | 0 refills | Status: DC

## 2021-09-21 NOTE — Telephone Encounter (Signed)
Confirmed (Lorazepam 0.'5mg'$  ) is a current medication. Last prescribed 07/06/21 30-0 taking 0.5-1 TABLETS (0.25-0.5 MG TOTAL) BY MOUTH AT BEDTIME AS NEEDED FOR ANXIETY. Next apnt sceduled for 11/02/2021 with [facility], last seen 09/05/2021 by Dr. Sabra Heck. Routing to  Joelene Millin, NP ?

## 2021-09-21 NOTE — Patient Instructions (Addendum)
Good to see you  ?- Start meloxicam 15 mg daily x2 weeks.  If still having pain after 2 weeks, complete 3rd-week of meloxicam. May use remaining meloxicam as needed once daily for pain control.  Do not to use additional NSAIDs while taking meloxicam.  May use Tylenol [phone] mg 2 to 3 times a day for breakthrough pain. ?Neck HEP  ?Epidural right L5-S1 ?Follow up 2 weeks after your injection  ?

## 2021-09-28 ENCOUNTER — Ambulatory Visit
Admission: RE | Admit: 2021-09-28 | Discharge: 2021-09-28 | Disposition: A | Discharge status: Home / Self Care | Payer: Medicare HMO | Source: Ambulatory Visit | Attending: Sports Medicine | Admitting: Sports Medicine

## 2021-09-28 ENCOUNTER — Other Ambulatory Visit: Payer: Self-pay | Admitting: Sports Medicine

## 2021-09-28 ENCOUNTER — Other Ambulatory Visit: Payer: Self-pay

## 2021-09-28 DIAGNOSIS — M4727 Other spondylosis with radiculopathy, lumbosacral region: Secondary | ICD-10-CM | POA: Diagnosis not present

## 2021-09-28 IMAGING — XA DG EPIDURAL NERVE ROOT
2 series · 2 of 2 positions shown · non-contrast
Comparison: none

CLINICAL DATA: Lumbosacral spondylosis without myelopathy with
radiculopathy. Right-sided low back pain. 95% relief for 9 months
after prior right L5 nerve root block/transforaminal epidural
injection [REDACTED]. Symptoms have recurred.

[Series 1: ortho standard · 1 of 1 slices shown (1 of 2)]
[im 1/1]
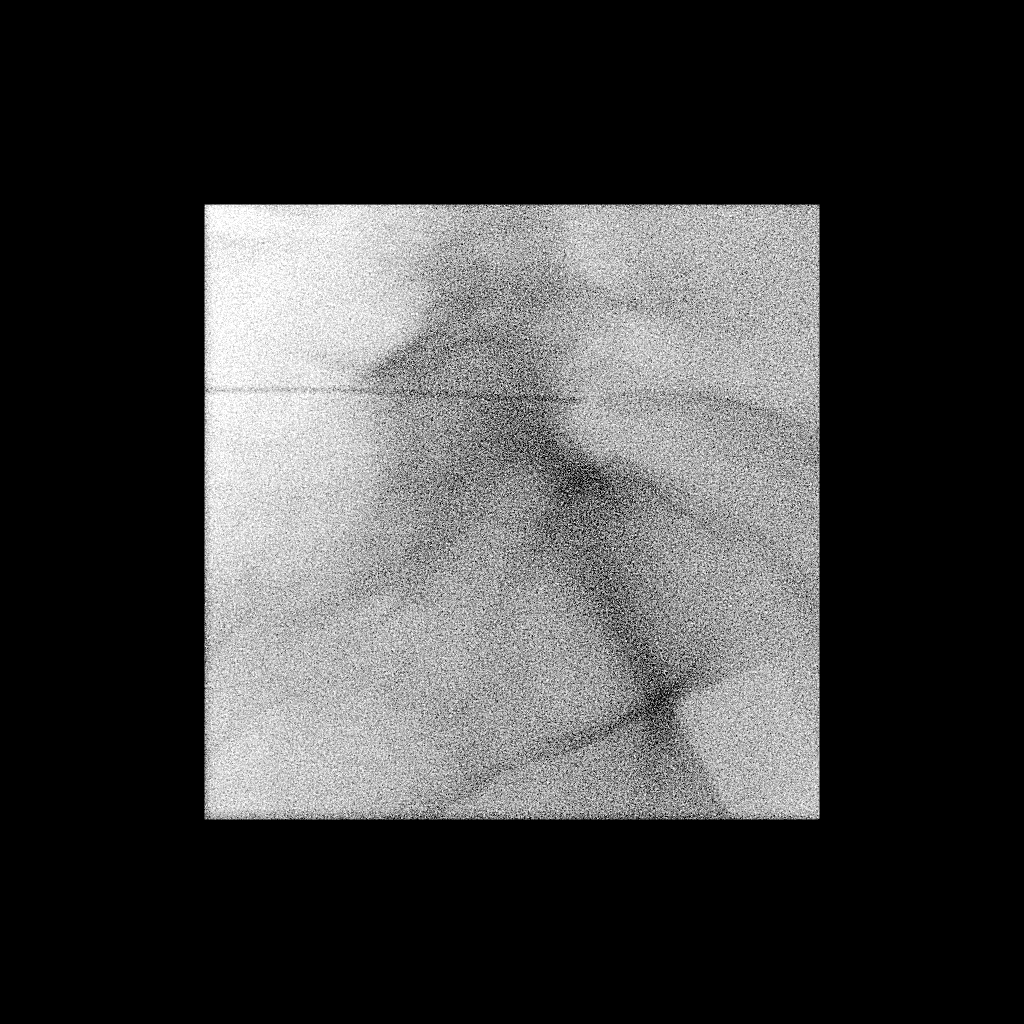

[Series 2: ortho standard · 1 of 1 slices shown (2 of 2)]
[im 1/1]
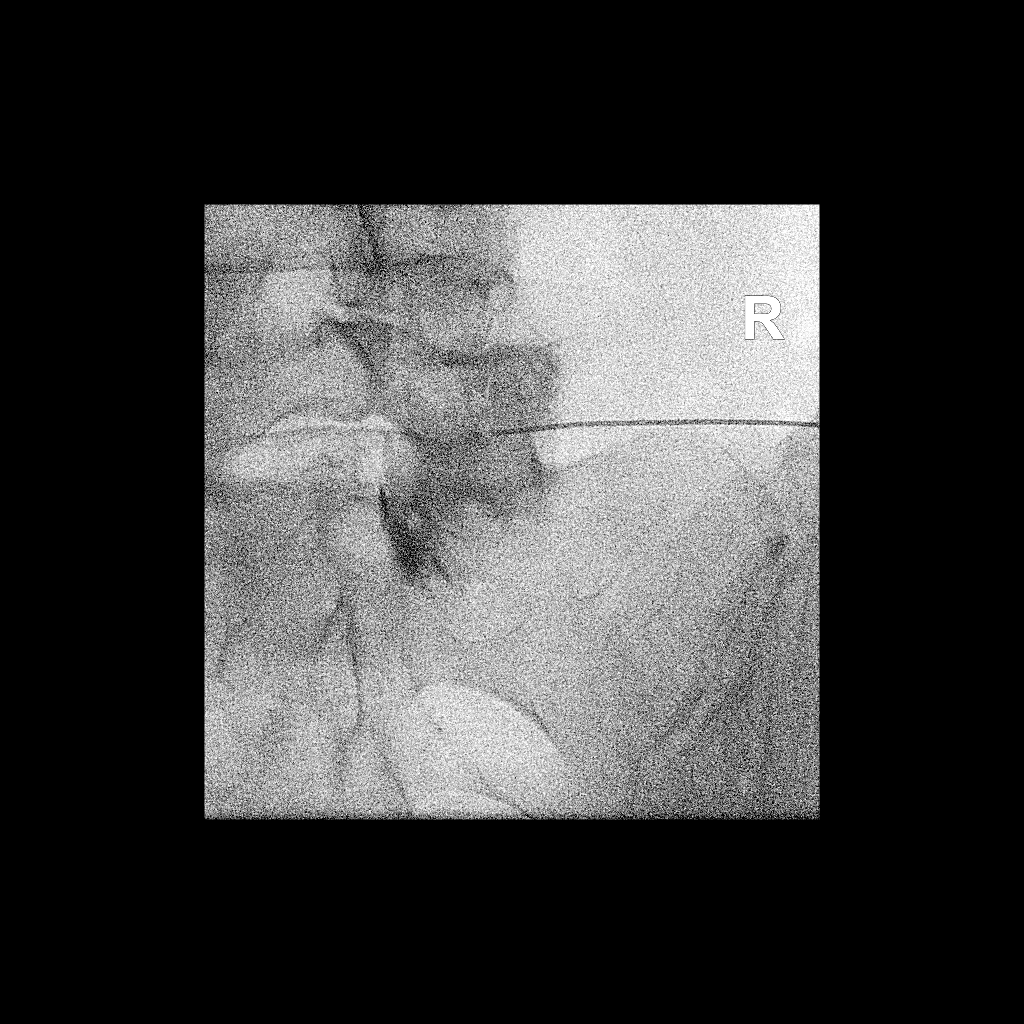

[2 of 2 positions shown; findings below may reference images not displayed]

EXAM:
EPIDURAL/NERVE ROOT

FLUOROSCOPY TIME:  Radiation Exposure Index (as provided by the
fluoroscopic device): 1.7 mGy Kerma

PROCEDURE:
The procedure, risks, benefits, and alternatives were explained to
the patient. Questions regarding the procedure were encouraged and
answered. The patient understands and consents to the procedure.

RIGHT L5 NERVE ROOT BLOCK AND TRANSFORAMINAL EPIDURAL: A posterior
oblique approach was taken to the intervertebral foramen on the
right at L5-S1 using a curved 5 inch 22 gauge spinal needle.
Injection of Isovue M 200 outlined the right L5 nerve root and
showed good epidural spread. No vascular opacification is seen. 80
mg of Depo-Medrol mixed with 2 mL of 1% lidocaine were instilled.
The procedure was well-tolerated, and the patient was discharged
thirty minutes following the injection in good condition.

COMPLICATIONS:
None immediate.
IMPRESSION: Technically successful injection consisting of a right L5 nerve root
block and transforaminal epidural.

## 2021-09-28 MED ORDER — IOPAMIDOL (ISOVUE-M 200) INJECTION 41%
1.0000 mL | Freq: Once | INTRAMUSCULAR | Status: AC
  Administered 2021-09-28: 1 mL via EPIDURAL

## 2021-09-28 MED ORDER — METHYLPREDNISOLONE ACETATE 40 MG/ML INJ SUSP (RADIOLOG
80.0000 mg | Freq: Once | INTRAMUSCULAR | Status: AC
  Administered 2021-09-28: 80 mg via EPIDURAL

## 2021-09-28 NOTE — Discharge Instructions (Signed)

## 2021-10-12 DIAGNOSIS — H43813 Vitreous degeneration, bilateral: Secondary | ICD-10-CM | POA: Diagnosis not present

## 2021-10-12 DIAGNOSIS — H524 Presbyopia: Secondary | ICD-10-CM | POA: Diagnosis not present

## 2021-10-12 DIAGNOSIS — H5203 Hypermetropia, bilateral: Secondary | ICD-10-CM | POA: Diagnosis not present

## 2021-10-12 DIAGNOSIS — H52203 Unspecified astigmatism, bilateral: Secondary | ICD-10-CM | POA: Diagnosis not present

## 2021-10-12 DIAGNOSIS — H04123 Dry eye syndrome of bilateral lacrimal glands: Secondary | ICD-10-CM | POA: Diagnosis not present

## 2021-10-12 DIAGNOSIS — H40003 Preglaucoma, unspecified, bilateral: Secondary | ICD-10-CM | POA: Diagnosis not present

## 2021-10-12 DIAGNOSIS — H5371 Glare sensitivity: Secondary | ICD-10-CM | POA: Diagnosis not present

## 2021-10-12 DIAGNOSIS — H2513 Age-related nuclear cataract, bilateral: Secondary | ICD-10-CM | POA: Diagnosis not present

## 2021-10-12 DIAGNOSIS — H25011 Cortical age-related cataract, right eye: Secondary | ICD-10-CM | POA: Diagnosis not present

## 2021-10-14 DIAGNOSIS — K219 Gastro-esophageal reflux disease without esophagitis: Secondary | ICD-10-CM | POA: Diagnosis not present

## 2021-10-14 DIAGNOSIS — M545 Low back pain, unspecified: Secondary | ICD-10-CM | POA: Diagnosis not present

## 2021-10-14 DIAGNOSIS — R69 Illness, unspecified: Secondary | ICD-10-CM | POA: Diagnosis not present

## 2021-10-14 DIAGNOSIS — I2510 Atherosclerotic heart disease of native coronary artery without angina pectoris: Secondary | ICD-10-CM | POA: Diagnosis not present

## 2021-10-14 DIAGNOSIS — G8929 Other chronic pain: Secondary | ICD-10-CM | POA: Diagnosis not present

## 2021-10-14 DIAGNOSIS — R030 Elevated blood-pressure reading, without diagnosis of hypertension: Secondary | ICD-10-CM | POA: Diagnosis not present

## 2021-10-14 DIAGNOSIS — E785 Hyperlipidemia, unspecified: Secondary | ICD-10-CM | POA: Diagnosis not present

## 2021-10-14 DIAGNOSIS — Z7982 Long term (current) use of aspirin: Secondary | ICD-10-CM | POA: Diagnosis not present

## 2021-10-14 DIAGNOSIS — E039 Hypothyroidism, unspecified: Secondary | ICD-10-CM | POA: Diagnosis not present

## 2021-10-14 DIAGNOSIS — M8580 Other specified disorders of bone density and structure, unspecified site: Secondary | ICD-10-CM | POA: Diagnosis not present

## 2021-10-14 DIAGNOSIS — M62838 Other muscle spasm: Secondary | ICD-10-CM | POA: Diagnosis not present

## 2021-10-14 DIAGNOSIS — Z008 Encounter for other general examination: Secondary | ICD-10-CM | POA: Diagnosis not present

## 2021-10-17 DIAGNOSIS — H2511 Age-related nuclear cataract, right eye: Secondary | ICD-10-CM | POA: Insufficient documentation

## 2021-10-18 ENCOUNTER — Other Ambulatory Visit: Payer: Self-pay | Admitting: Nurse Practitioner

## 2021-10-18 DIAGNOSIS — F3341 Major depressive disorder, recurrent, in partial remission: Secondary | ICD-10-CM

## 2021-10-18 NOTE — Telephone Encounter (Signed)
Patient has request refill on medication "Zoloft '50mg'$ ". Patient last refill dated 04/24/2021. Patient medication has High Risk Warnings. Medication pend and sent to Marlowe Sax, NP due to PCP Sabra Heck Lillette Boxer, MD being out of office.  ?

## 2021-10-21 ENCOUNTER — Other Ambulatory Visit: Payer: Self-pay | Admitting: Family Medicine

## 2021-10-30 ENCOUNTER — Other Ambulatory Visit: Payer: Self-pay | Admitting: *Deleted

## 2021-10-30 DIAGNOSIS — E785 Hyperlipidemia, unspecified: Secondary | ICD-10-CM | POA: Diagnosis not present

## 2021-10-30 DIAGNOSIS — N1831 Chronic kidney disease, stage 3a: Secondary | ICD-10-CM | POA: Diagnosis not present

## 2021-10-30 DIAGNOSIS — J069 Acute upper respiratory infection, unspecified: Secondary | ICD-10-CM

## 2021-10-30 DIAGNOSIS — Z1159 Encounter for screening for other viral diseases: Secondary | ICD-10-CM | POA: Diagnosis not present

## 2021-10-30 DIAGNOSIS — L659 Nonscarring hair loss, unspecified: Secondary | ICD-10-CM | POA: Diagnosis not present

## 2021-10-30 DIAGNOSIS — I129 Hypertensive chronic kidney disease with stage 1 through stage 4 chronic kidney disease, or unspecified chronic kidney disease: Secondary | ICD-10-CM | POA: Diagnosis not present

## 2021-11-01 LAB — COMPLETE METABOLIC PANEL WITHOUT GFR
AG Ratio: 1.7 (calc) (ref 1.0–2.5)
ALT: 13 U/L (ref 6–29)
AST: 19 U/L (ref 10–35)
Albumin: 4.1 g/dL (ref 3.6–5.1)
Alkaline phosphatase (APISO): 61 U/L (ref 37–153)
BUN/Creatinine Ratio: 16 (calc) (ref 6–22)
BUN: 17 mg/dL (ref 7–25)
CO2: 24 mmol/L (ref 20–32)
Calcium: 9.3 mg/dL (ref 8.6–10.4)
Chloride: 104 mmol/L (ref 98–110)
Creat: 1.07 mg/dL — ABNORMAL HIGH (ref 0.60–1.00)
Globulin: 2.4 g/dL (ref 1.9–3.7)
Glucose, Bld: 89 mg/dL (ref 65–99)
Potassium: 4.3 mmol/L (ref 3.5–5.3)
Sodium: 140 mmol/L (ref 135–146)
Total Bilirubin: 0.4 mg/dL (ref 0.2–1.2)
Total Protein: 6.5 g/dL (ref 6.1–8.1)
eGFR: 53 mL/min/1.73m2 — ABNORMAL LOW

## 2021-11-01 LAB — CBC WITH DIFFERENTIAL/PLATELET
Absolute Monocytes: 323 cells/uL (ref 200–950)
Basophils Absolute: 8 cells/uL (ref 0–200)
Basophils Relative: 0.2 %
Eosinophils Absolute: 50 cells/uL (ref 15–500)
Eosinophils Relative: 1.2 %
HCT: 44.6 % (ref 35.0–45.0)
Hemoglobin: 15 g/dL (ref 11.7–15.5)
Lymphs Abs: 1945 cells/uL (ref 850–3900)
MCH: 32 pg (ref 27.0–33.0)
MCHC: 33.6 g/dL (ref 32.0–36.0)
MCV: 95.1 fL (ref 80.0–100.0)
MPV: 10.2 fL (ref 7.5–12.5)
Monocytes Relative: 7.7 %
Neutro Abs: 1873 cells/uL (ref 1500–7800)
Neutrophils Relative %: 44.6 %
Platelets: 274 10*3/uL (ref 140–400)
RBC: 4.69 10*6/uL (ref 3.80–5.10)
RDW: 12.4 % (ref 11.0–15.0)
Total Lymphocyte: 46.3 %
WBC: 4.2 10*3/uL (ref 3.8–10.8)

## 2021-11-01 LAB — LIPID PANEL
Cholesterol: 182 mg/dL (ref <200)
HDL: 65 mg/dL (ref >=50)
LDL Cholesterol (Calc): 95 mg/dL (calc)
Non-HDL Cholesterol (Calc): 117 mg/dL (calc) (ref <130)
Total CHOL/HDL Ratio: 2.8 (calc) (ref <5.0)
Triglycerides: 122 mg/dL (ref <150)

## 2021-11-01 LAB — HEPATITIS C ANTIBODY
Hepatitis C Ab: NONREACTIVE
SIGNAL TO CUT-OFF: 0.04

## 2021-11-01 LAB — TSH: TSH: 1.58 mIU/L (ref 0.40–4.50)

## 2021-11-02 ENCOUNTER — Encounter: Payer: Self-pay | Admitting: Nurse Practitioner

## 2021-11-02 ENCOUNTER — Non-Acute Institutional Stay: Payer: Medicare HMO | Admitting: Nurse Practitioner

## 2021-11-02 VITALS — BP 118/80 | HR 62 | Temp 97.8°F | Ht 63.0 in | Wt 138.6 lb

## 2021-11-02 DIAGNOSIS — M8580 Other specified disorders of bone density and structure, unspecified site: Secondary | ICD-10-CM

## 2021-11-02 DIAGNOSIS — F339 Major depressive disorder, recurrent, unspecified: Secondary | ICD-10-CM | POA: Diagnosis not present

## 2021-11-02 DIAGNOSIS — M5136 Other intervertebral disc degeneration, lumbar region: Secondary | ICD-10-CM

## 2021-11-02 DIAGNOSIS — N1831 Chronic kidney disease, stage 3a: Secondary | ICD-10-CM | POA: Diagnosis not present

## 2021-11-02 DIAGNOSIS — Z820 Family history of epilepsy and other diseases of the nervous system: Secondary | ICD-10-CM

## 2021-11-02 DIAGNOSIS — K219 Gastro-esophageal reflux disease without esophagitis: Secondary | ICD-10-CM

## 2021-11-02 DIAGNOSIS — E039 Hypothyroidism, unspecified: Secondary | ICD-10-CM | POA: Diagnosis not present

## 2021-11-02 DIAGNOSIS — R69 Illness, unspecified: Secondary | ICD-10-CM | POA: Diagnosis not present

## 2021-11-02 DIAGNOSIS — E785 Hyperlipidemia, unspecified: Secondary | ICD-10-CM

## 2021-11-02 NOTE — Assessment & Plan Note (Signed)
LDL 95 10/30/21 takes Pravastatin, ASA 

## 2021-11-02 NOTE — Assessment & Plan Note (Signed)
Chronic lower back pain, f/u Ortho, takes Gabapentin, Meloxicam ? Restless legs, nightmares ?

## 2021-11-02 NOTE — Assessment & Plan Note (Addendum)
Stable, off Esomeprazole.  ?

## 2021-11-02 NOTE — Assessment & Plan Note (Signed)
Bun/creat 17/1.07 eGFR 53 10/30/21 ?

## 2021-11-02 NOTE — Assessment & Plan Note (Signed)
prn Lorazepam, Sertraline, on going anxiety component of the issue.  ?

## 2021-11-02 NOTE — Assessment & Plan Note (Signed)
no recent fx. on Ca Vit D 

## 2021-11-02 NOTE — Assessment & Plan Note (Signed)
Continue Levothyroxine, TSH 1.58 10/30/21 ?

## 2021-11-02 NOTE — Assessment & Plan Note (Signed)
Hx of ocular migraine-last episode about 6 weeks-Tylenol helped, c/o top of head and neck discomfort at times, not new, no neurological deficits symptoms today. Mother had Parkinson/dementia.  ?Neurology referral.  ?

## 2021-11-02 NOTE — Progress Notes (Signed)
? ?Location:  clinic [place]  ?  ?Place of Service:  Clinic (12) ?Provider: Marlana Latus NP ? ?Code Status: DNR ?Goals of Care: [place] ? ?  08/24/2021  ?  1:26 PM  ?Advanced Directives  ?Type of Paramedic of [place];Living will;Out of facility DNR (pink MOST or yellow form)  ?Pre-existing out of facility DNR order (yellow form or pink MOST form) Pink MOST form placed in chart (order not valid for inpatient use)  ? ? ? ?Chief Complaint  ?Patient presents with  ? Medical Management of Chronic Issues  ?  Patient presents today for a 3 month follow-up.  ? ? ?HPI: Patient is a 81 y.o. female seen today for medical management of chronic diseases.   ?  ? ?            Hx of ocular migraine, top of head and neck discomfort at times, not new, no neurological deficits symptoms today. Mother had Parkinson/dementia.  ?            Hypothyroidism Levothyroxine, TSH 1.58 10/30/21 ?            Depression/anxieyt, prn Lorazepam, Sertraline. Denied depression.  ?            CKD Bun/creat 17/1.07 eGFR 53 10/30/21 ?            S/p Mitral valve repair ?            Chronic lower back pain, f/u Ortho, takes Gabapentin, Meloxicam ? Restless legs, nightmares ?            Hyperlipidemia, LDL 95 10/30/21 takes Pravastatin, ASA ?            Osteopenia, no recent fx. on Ca Vit D ? GERD ,stable, off Esomeprazole ? ?Past Medical History:  ?Diagnosis Date  ? Anxiety and depression   ? History of repair of mitral valve   ? Hypothyroid   ? ? ?Past Surgical History:  ?Procedure Laterality Date  ? ABDOMINAL HYSTERECTOMY  1974  ? Dr. Etta Grandchild, [facility] [place]  ? MITRAL VALVE REPAIR  2018  ? Dr. Lunette Stands, [place]  ? [place]  ? ? ?Allergies  ?Allergen Reactions  ? Penicillin G Benzathine & Proc Other (See Comments)  ?  Unknown.  ? ? ?Allergies as of 11/02/2021   ? ?   Reactions  ? Penicillin G Benzathine & Proc Other (See Comments)  ? Unknown.  ? ?  ? ?  ?Medication List  ?  ? ?  ? Accurate as of  November 02, 2021 11:59 PM. If you have any questions, ask your nurse or doctor.  ?  ?  ? ?  ? ?STOP taking these medications   ? ?esomeprazole 40 MG capsule ?Commonly known as: NexIUM ?Stopped by: Esthela Brandner X Johnnie Moten, NP ?  ? ?  ? ?TAKE these medications   ? ?aspirin 81 MG chewable tablet ?Chew by mouth daily. ?  ?b complex vitamins tablet ?Take 1 tablet by mouth daily. ?  ?calcium-vitamin D 250-125 MG-UNIT tablet ?Commonly known as: OSCAL WITH D ?Take 1 tablet by mouth daily. Dose unknown-Patient to find out. ?  ?cholecalciferol 25 MCG (1000 UNIT) tablet ?Commonly known as: VITAMIN D3 ?Take 1,000 Units by mouth daily. ?  ?gabapentin 100 MG capsule ?Commonly known as: NEURONTIN ?TAKE TWO CAPSULES BY MOUTH DAILY AT BEDTIME ?  ?levothyroxine 25 MCG tablet ?Commonly known as: SYNTHROID ?daily before breakfast. 1.5  tablets daily ?  ?LORazepam 0.5 MG tablet ?Commonly known as: ATIVAN ?TAKE 1/2 TO 1 TABLET BY MOUTH AT BEDTIME AS NEEDED FOR ANXIETY ?  ?MAGNESIUM GLYCINATE PO ?Take 2 capsules by mouth daily. ?  ?meloxicam 15 MG tablet ?Commonly known as: MOBIC ?Take 1 tablet (15 mg total) by mouth daily. ?  ?sertraline 50 MG tablet ?Commonly known as: ZOLOFT ?TAKE 1/2 TABLET BY MOUTH EVERY DAY ?  ?vitamin C 100 MG tablet ?Take 100 mg by mouth daily. ?  ? ?  ? ? ?Review of Systems:  ?Review of Systems  ?Constitutional:  Negative for fatigue, fever and unexpected weight change.  ?HENT:  Negative for congestion and voice change.   ?Eyes:  Negative for visual disturbance.  ?     Dry eyes  ?Respiratory:  Negative for cough, shortness of breath and wheezing.   ?Cardiovascular:  Negative for leg swelling.  ?Gastrointestinal:  Negative for abdominal pain and constipation.  ?     Hemorrhoidal external from previous exam  ?Genitourinary:  Negative for dysuria and frequency.  ?Musculoskeletal:  Positive for arthralgias, back pain and neck pain. Negative for gait problem.  ?     Chronic neck pain  ?Skin:  Negative for color change.  ?     Hair  thinning on top of head.   ?Neurological:  Positive for headaches. Negative for dizziness, tremors, seizures, syncope, facial asymmetry, speech difficulty, weakness, light-headedness and numbness.  ?     Memory lapses. Top of head, neck discomfort not new  ?Psychiatric/Behavioral:  Positive for sleep disturbance. Negative for confusion. The patient is not nervous/anxious.   ?     Occasional   ? ?Health Maintenance  ?Topic Date Due  ? DEXA SCAN  Never done  ? COVID-19 Vaccine (4 - Booster) 05/30/2021  ? Zoster Vaccines- Shingrix (1 of 2) 11/21/2021 (Originally 05/09/1960)  ? TETANUS/TDAP  11/28/2021 (Originally 08/15/2021)  ? INFLUENZA VACCINE  02/13/2022  ? Pneumonia Vaccine 71+ Years old  Completed  ? Hepatitis C Screening  Completed  ? HPV VACCINES  Aged Out  ? ? ?Physical Exam: ?Vitals:  ? 11/02/21 1453  ?BP: 118/80  ?Pulse: 62  ?Temp: 97.8 ?F (36.6 ?C)  ?SpO2: 98%  ?Weight: 138 lb 9.6 oz (62.9 kg)  ?Height: 5' 3"  (1.6 m)  ? ?Body mass index is 24.55 kg/m?Marland Kitchen ?Physical Exam ?Vitals and nursing note reviewed.  ?Constitutional:   ?   Appearance: Normal appearance.  ?HENT:  ?   Head: Normocephalic and atraumatic.  ?   Nose: Nose normal.  ?   Mouth/Throat:  ?   Mouth: Mucous membranes are moist.  ?   Pharynx: No oropharyngeal exudate or posterior oropharyngeal erythema.  ?   Comments: Sore spot lateral left throat, in white color.  ?Eyes:  ?   Conjunctiva/sclera: Conjunctivae normal.  ?   Pupils: Pupils are equal, round, and reactive to light.  ?   Comments: dry eyes  ?Cardiovascular:  ?   Rate and Rhythm: Normal rate and regular rhythm.  ?   Heart sounds: No murmur heard. ?Pulmonary:  ?   Effort: Pulmonary effort is normal.  ?   Breath sounds: No rales.  ?Abdominal:  ?   General: Bowel sounds are normal.  ?   Palpations: Abdomen is soft.  ?   Tenderness: There is no abdominal tenderness.  ?Genitourinary: ?   Comments: Hemorrhoids external. Breast exam  ?Musculoskeletal:  ?   Cervical back: Normal range of motion and  neck  supple.  ?   Right lower leg: No edema.  ?   Left lower leg: No edema.  ?Skin: ?   General: Skin is warm and dry.  ?Neurological:  ?   General: No focal deficit present.  ?   Mental Status: She is alert and oriented to person, place, and time. Mental status is at baseline.  ?   Gait: Gait normal.  ?Psychiatric:     ?   Mood and Affect: Mood normal.     ?   Behavior: Behavior normal.     ?   Thought Content: Thought content normal.     ?   Judgment: Judgment normal.  ? ? ?Labs reviewed: ?Basic Metabolic Panel: ?Recent Labs  ?  10/30/21 ?1540  ?NA 140  ?K 4.3  ?CL 104  ?CO2 24  ?GLUCOSE 89  ?BUN 17  ?CREATININE 1.07*  ?CALCIUM 9.3  ?TSH 1.58  ? ?Liver Function Tests: ?Recent Labs  ?  10/30/21 ?1540  ?AST 19  ?ALT 13  ?BILITOT 0.4  ?PROT 6.5  ? ?No results for input(s): LIPASE, AMYLASE in the last 8760 hours. ?No results for input(s): AMMONIA in the last 8760 hours. ?CBC: ?Recent Labs  ?  10/30/21 ?1540  ?WBC 4.2  ?NEUTROABS 1,873  ?HGB 15.0  ?HCT 44.6  ?MCV 95.1  ?PLT 274  ? ?Lipid Panel: ?Recent Labs  ?  10/30/21 ?1540  ?CHOL 182  ?HDL 65  ?[place] 95  ?TRIG 122  ?CHOLHDL 2.8  ? ?No results found for: HGBA1C ? ?Procedures since last visit: ?No results found. ? ?Assessment/Plan ? ?Hypothyroidism ?Continue Levothyroxine, TSH 1.58 10/30/21 ? 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  ? ?CKD (chronic kidney disease) stage 3, GFR 30-59 ml/min (HCC) ?Bun/creat 17/1.07 eGFR 53 10/30/21 ? ?Lumbar degenerative disc disease ?Chronic lower back pain, f/u Ortho, takes Gabapentin, Meloxicam ? Restless legs, nightmares ? ?Hyperlipidemia ?LDL 95 10/30/21 takes Pravastatin, ASA ? ?Osteopenia ?no recent fx. on Ca Vit D ? ?GERD (gastroesophageal reflux disease) ?Stable, off Esomeprazole.  ? ?Family history of Alzheimer's disease ? Hx of ocular migraine-last episode about 6 weeks-Tylenol helped, c/o top of head and neck discomfort at times, not new, no neurological deficits  symptoms today. Mother had Parkinson/dementia.  ?Neurology referral.  ? ? ?Labs/tests ordered:  none ? ?Next appt:  5 months ?  ?

## 2021-11-03 ENCOUNTER — Encounter: Payer: Self-pay | Admitting: Nurse Practitioner

## 2021-11-06 ENCOUNTER — Other Ambulatory Visit: Payer: Self-pay | Admitting: Nurse Practitioner

## 2021-11-06 NOTE — Telephone Encounter (Signed)
Patient request refill. Medication sent to PCP Mast, Man X, NP  ?

## 2021-11-21 DIAGNOSIS — H2512 Age-related nuclear cataract, left eye: Secondary | ICD-10-CM | POA: Diagnosis not present

## 2021-11-21 DIAGNOSIS — H25812 Combined forms of age-related cataract, left eye: Secondary | ICD-10-CM | POA: Diagnosis not present

## 2021-11-21 DIAGNOSIS — H25811 Combined forms of age-related cataract, right eye: Secondary | ICD-10-CM | POA: Diagnosis not present

## 2021-12-26 DIAGNOSIS — L814 Other melanin hyperpigmentation: Secondary | ICD-10-CM | POA: Diagnosis not present

## 2021-12-26 DIAGNOSIS — L570 Actinic keratosis: Secondary | ICD-10-CM | POA: Diagnosis not present

## 2021-12-26 DIAGNOSIS — L719 Rosacea, unspecified: Secondary | ICD-10-CM | POA: Diagnosis not present

## 2022-01-02 NOTE — Progress Notes (Unsigned)
Kristine [name] [address] [place] Phone: [phone] Subjective:   [name], am serving as a scribe for Dr. Hulan Saas.  I'm seeing this patient by the request  of:  Mast, Man X, NP  CC: Neck and back pain follow-up  ION:[id number]  Last seen May 2022, most recently seen by Dr. Glennon Mac 09/21/2021 for neck pain. Had OMT performed by another provider in March. Patient is having some back pain previously and has had 2 epidurals of her back done previously as last one in March 2023.  Kristine [name] is a 81 y.o. female coming in with complaint of neck pain. Neck and back pain. No radiating pain. Would like to have an epidural in neck and back. Has lots of headaches.   Previous MRI from April 2022 showed that there is a retrolisthesis most significant at L4-L5 and facet arthropathy with moderate stenosis    Past Medical History:  Diagnosis Date   Anxiety and depression    History of repair of mitral valve    Hypothyroid    Past Surgical History:  Procedure Laterality Date   ABDOMINAL HYSTERECTOMY  1974   Dr. Etta Grandchild, [facility] [place]   MITRAL VALVE REPAIR  2018   Dr. Lunette Stands, [place] History   Socioeconomic History   Marital status: Married    Spouse name: Not on file   Number of children: Not on file   Years of education: Not on file   Highest education level: Associate degree: occupational, Hotel manager, or vocational program  Occupational History   Not on file  Tobacco Use   Smoking status: Never   Smokeless tobacco: Never  Vaping Use   Vaping Use: Never used  Substance and Sexual Activity   Alcohol use: Yes    Comment: 1-2 drinks a month    Drug use: Never   Sexual activity: Not on file  Other Topics Concern   Not on file  Social History Narrative   Social History      Diet? No       Do you drink/eat things with caffeine? 1/2 cup coffee,  tea       Marital status?     Married                               What year were you married? 1965      Do you live in a house, apartment, assisted living, condo, trailer, etc.? Apartment       Is it one or more stories? No       How many persons live in your home? 2       Do you have any pets in your home? No       Highest level of education completed? 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         occasionaly                             Type & how often? Walking elliptical before covid       Advanced Directives      Do you have a living  will? yes      Do you have a DNR form?                                  If not, do you want to discuss one? No       Do you have signed POA/HPOA for forms? yes      Functional Status      Do you have difficulty bathing or dressing yourself? No       Do you have difficulty preparing food or eating? No       Do you have difficulty managing your medications? No       Do you have difficulty managing your finances? No       Do you have difficulty affording your medications? No       Social Determinants of Radio broadcast assistant Strain: Not on file  Food Insecurity: Not on file  Transportation Needs: Not on file  Physical Activity: Not on file  Stress: Not on file  Social Connections: Not on file   Allergies  Allergen Reactions   Penicillin G Benzathine & Proc Other (See Comments)    Unknown.   Family History  Problem Relation Age of Onset   Parkinson's disease Mother    Dementia Mother    Prostate cancer Father    Stroke Sister    High Cholesterol Sister    Anxiety disorder Brother    Depression Brother    Panic disorder Brother     Current Outpatient Medications (Endocrine & Metabolic):    levothyroxine (SYNTHROID) 25 MCG tablet, PLEASE SEE ATTACHED FOR DETAILED DIRECTIONS    Current Outpatient  Medications (Analgesics):    aspirin 81 MG chewable tablet, Chew by mouth daily.   meloxicam (MOBIC) 15 MG tablet, Take 1 tablet (15 mg total) by mouth daily.   Current Outpatient Medications (Other):    Ascorbic Acid (VITAMIN C) 100 MG tablet, Take 100 mg by mouth daily.   b complex vitamins tablet, Take 1 tablet by mouth daily.   calcium-vitamin D (OSCAL WITH D) 250-125 MG-UNIT tablet, Take 1 tablet by mouth daily. Dose unknown-Patient to find out.   cholecalciferol (VITAMIN D3) 25 MCG (1000 UT) tablet, Take 1,000 Units by mouth daily.   gabapentin (NEURONTIN) 100 MG capsule, TAKE TWO CAPSULES BY MOUTH DAILY AT BEDTIME   LORazepam (ATIVAN) 0.5 MG tablet, TAKE 1/2 TO 1 TABLET BY MOUTH AT BEDTIME AS NEEDED FOR ANXIETY   MAGNESIUM GLYCINATE PO, Take 2 capsules by mouth daily.    sertraline (ZOLOFT) 50 MG tablet, TAKE 1/2 TABLET BY MOUTH EVERY DAY   Reviewed prior external information including notes and imaging from  primary care provider As well as notes that were available from care everywhere and other healthcare systems.  Past medical history, social, surgical and family history all reviewed in electronic medical record.  No pertanent information unless stated regarding to the chief complaint.   Review of Systems:  No headache, visual changes, nausea, vomiting, diarrhea, constipation, dizziness, abdominal pain, skin rash, fevers, chills, night sweats, weight loss, swollen lymph nodes, body aches, joint swelling, chest pain, shortness of breath, mood changes. POSITIVE muscle aches  Objective  Blood pressure 116/82, pulse (!) 59, height '5\' 3"'$  (1.6 m), weight 141 lb (64 kg), SpO2 99 %.   General: No apparent distress alert and oriented x3 mood and affect normal, dressed appropriately.  HEENT: Pupils equal, extraocular  movements intact  Respiratory: Patient's speak in full sentences and does not appear short of breath  Cardiovascular: No lower extremity edema, non tender, no erythema   Neck exam does have some loss of lordosis.  No crepitus.  Patient lacks last 10 degrees of extension.  Low back exam severe tightness noted.  The patient does have positive impingement noted with a positive straight leg test on the right side.  This is at 30 degrees of flexion.  Patient does have some mild atrophy noted of the right lower extremity tightness with straight leg test bilaterally.  Has less than 5 degrees of extension of the back.     Impression and Recommendations:     The above documentation has been reviewed and is accurate and complete Lyndal Pulley, DO

## 2022-01-03 ENCOUNTER — Ambulatory Visit: Payer: Medicare HMO | Admitting: Family Medicine

## 2022-01-03 VITALS — BP 116/82 | HR 59 | Ht 63.0 in | Wt 141.0 lb

## 2022-01-03 DIAGNOSIS — M5412 Radiculopathy, cervical region: Secondary | ICD-10-CM | POA: Diagnosis not present

## 2022-01-03 DIAGNOSIS — M5030 Other cervical disc degeneration, unspecified cervical region: Secondary | ICD-10-CM

## 2022-01-03 DIAGNOSIS — M5416 Radiculopathy, lumbar region: Secondary | ICD-10-CM

## 2022-01-03 NOTE — Patient Instructions (Addendum)
Epidural [phone] Possible radio frequency ablation of lumbar spine as alt tx option IF we dont get as much resolution will consider MRI of neck and back See me in 2-3 months

## 2022-01-03 NOTE — Assessment & Plan Note (Signed)
History of degenerative disc and worsening symptoms. I do believe the patient would respond well to an epidural at C7 and T1 as well.  Patient is traveling and feels like this will be more beneficial.  Has not had great luck with different oral medications including the meloxicam.  Patient will follow-up with me again in 6 to 8 weeks otherwise.

## 2022-01-03 NOTE — Assessment & Plan Note (Addendum)
Worsening symptoms.  Loss of lordosis patient feels like the injections have been helpful.  Do feel that advanced imaging would be warranted if patient does not have any significant improvement at this time.  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  We will see how patient responds and follow-up with me otherwise in 6 to 8 weeks.  Could also be a candidate may be for radiofrequency ablation if this does not help

## 2022-01-05 ENCOUNTER — Ambulatory Visit
Admission: RE | Admit: 2022-01-05 | Discharge: 2022-01-05 | Disposition: A | Discharge status: Home / Self Care | Payer: Medicare HMO | Source: Ambulatory Visit | Attending: Family Medicine | Admitting: Family Medicine

## 2022-01-05 DIAGNOSIS — M5416 Radiculopathy, lumbar region: Secondary | ICD-10-CM

## 2022-01-05 DIAGNOSIS — M4727 Other spondylosis with radiculopathy, lumbosacral region: Secondary | ICD-10-CM | POA: Diagnosis not present

## 2022-01-05 IMAGING — XA DG EPIDURAL NERVE ROOT
2 series · 2 of 2 positions shown · non-contrast
Comparison: none

CLINICAL DATA: Lumbosacral spondylosis without myelopathy with
radiculopathy. Right-sided low back pain. Diminished length of
response after the most recent right L5 transforaminal injection in
[REDACTED] compared to previous injections. Repeat injection requested.

[Series 1: ortho adipose · 1 of 1 slices shown (1 of 2)]
[im 1/1]
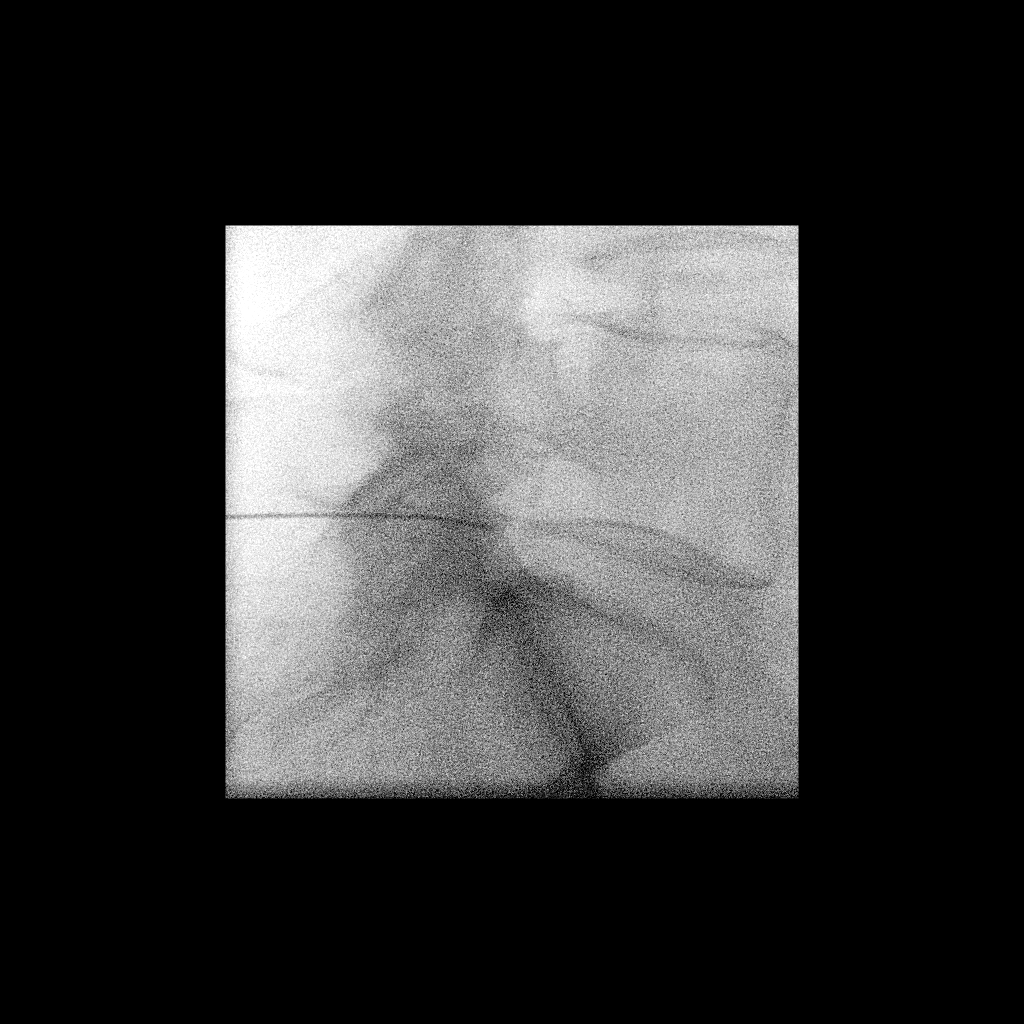

[Series 3: ortho adipose · 1 of 1 slices shown (2 of 2)]
[im 1/1]
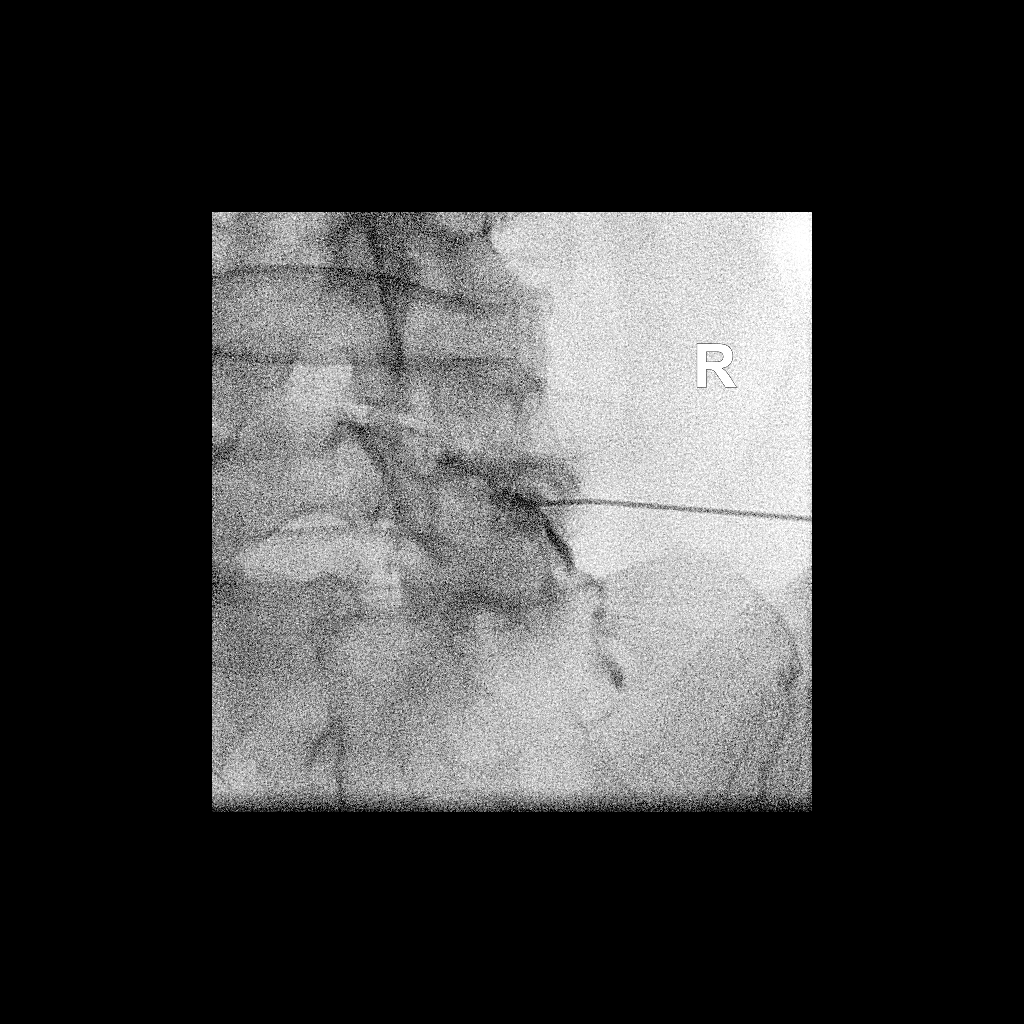

[2 of 2 positions shown; findings below may reference images not displayed]

EXAM:
EPIDURAL/NERVE ROOT

FLUOROSCOPY TIME:  Radiation Exposure Index (as provided by the
fluoroscopic device): 5.4 mGy Kerma

PROCEDURE:
The procedure, risks, benefits, and alternatives were explained to
the patient. Questions regarding the procedure were encouraged and
answered. The patient understands and consents to the procedure.

RIGHT L5 NERVE ROOT BLOCK AND TRANSFORAMINAL EPIDURAL: A posterior
oblique approach was taken to the intervertebral foramen on the
right at L5-S1 using a curved 5 inch 22 gauge spinal needle.
Injection of Isovue M 200 outlined the right L5 nerve root and
showed good epidural spread. No vascular opacification is seen. 80
mg of Depo-Medrol mixed with 2 mL of 1% lidocaine were instilled.
The procedure was well-tolerated, and the patient was discharged
thirty minutes following the injection in good condition.

COMPLICATIONS:
None immediate.
IMPRESSION: Technically successful injection consisting of a right L5 nerve root
block and transforaminal epidural.

## 2022-01-05 MED ORDER — METHYLPREDNISOLONE ACETATE 40 MG/ML INJ SUSP (RADIOLOG
80.0000 mg | Freq: Once | INTRAMUSCULAR | Status: AC
  Administered 2022-01-05: 80 mg via EPIDURAL

## 2022-01-05 MED ORDER — IOPAMIDOL (ISOVUE-M 200) INJECTION 41%
1.0000 mL | Freq: Once | INTRAMUSCULAR | Status: AC
  Administered 2022-01-05: 1 mL via EPIDURAL

## 2022-01-08 ENCOUNTER — Other Ambulatory Visit: Payer: Self-pay | Admitting: Nurse Practitioner

## 2022-02-07 ENCOUNTER — Ambulatory Visit: Payer: Medicare HMO | Admitting: Diagnostic Neuroimaging

## 2022-02-07 ENCOUNTER — Encounter: Payer: Self-pay | Admitting: Diagnostic Neuroimaging

## 2022-02-07 VITALS — BP 118/89 | HR 64 | Ht 63.5 in | Wt 137.4 lb

## 2022-02-07 DIAGNOSIS — R413 Other amnesia: Secondary | ICD-10-CM

## 2022-02-07 NOTE — Patient Instructions (Signed)
MILD MEMORY LOSS - could be mild cognitive impairment - check MRI brain, B12  TENSION HEADACHES (since ~2003) - continue tylenol as needed

## 2022-02-07 NOTE — Progress Notes (Signed)
[facility]  PATIENT: Kristine [name] DOB: 02-28-1941  REFERRING CLINICIAN: Mast, Man X, NP HISTORY FROM: PATIENT REASON FOR VISIT: NEW CONSULT   HISTORICAL  CHIEF COMPLAINT:  Chief Complaint  Patient presents with   Memory Loss    Rm 7 New Pt MMSE 65, "mother and mat grandfather had PD and dementia"    HISTORY OF PRESENT ILLNESS:   81 year old female here for evaluation of headaches and memory loss.  Since 2003 patient has had intermittent headaches on the top of her head with pressure sensation.  Sometimes pain radiates to the left side.  No nausea or vomiting.  No sensitive light or sound.  Headaches can last hours or days at a time.  Headaches typically worse when she is under stress and tension or in new situations.  Patient is also noted some mild memory problems in the last 1 to 2 years.  No major changes in ADLs.  [facility]  She is fairly active.  She is independent with ADLs and able to drive and shop.  She has some hearing loss and uses hearing aids.  Sometimes forgets recent conversations or names of people.   REVIEW OF SYSTEMS: Full 14 system review of systems performed and negative with exception of: AS PER hpi.  ALLERGIES: Allergies  Allergen Reactions   Penicillin G Benzathine & Proc Other (See Comments)    Unknown.    HOME MEDICATIONS: Outpatient Medications Prior to Visit  Medication Sig Dispense Refill   Ascorbic Acid (VITAMIN C) 100 MG tablet Take 100 mg by mouth daily.     aspirin 81 MG chewable tablet Chew by mouth daily.     b complex vitamins tablet Take 1 tablet by mouth daily.     calcium-vitamin D (OSCAL WITH D) 250-125 MG-UNIT tablet Take 1 tablet by mouth daily. Dose unknown-Patient to find out.     cholecalciferol (VITAMIN D3) 25 MCG (1000 UT) tablet Take 1,000 Units by mouth daily.     clindamycin (CLEOCIN) 300 MG capsule Take 300 mg by mouth. Prior to dental work     gabapentin  (NEURONTIN) 100 MG capsule TAKE TWO CAPSULES BY MOUTH DAILY AT BEDTIME 180 capsule 0   levothyroxine (SYNTHROID) 25 MCG tablet PLEASE SEE ATTACHED FOR DETAILED DIRECTIONS 135 tablet 3   LORazepam (ATIVAN) 0.5 MG tablet TAKE 1/2 TO 1 TABLET BY MOUTH AT BEDTIME AS NEEDED FOR ANXIETY 30 tablet 0   MAGNESIUM GLYCINATE PO Take 2 capsules by mouth daily.      sertraline (ZOLOFT) 50 MG tablet TAKE 1/2 TABLET BY MOUTH EVERY DAY 45 tablet 1   meloxicam (MOBIC) 15 MG tablet Take 1 tablet (15 mg total) by mouth daily. (Patient not taking: Reported on 02/07/2022) 30 tablet 0   No facility-administered medications prior to visit.    PAST MEDICAL HISTORY: Past Medical History:  Diagnosis Date   Anxiety and depression    DDD (degenerative disc disease), lumbar    History of repair of mitral valve    Hypertensive kidney disease    Hypothyroid     PAST SURGICAL HISTORY: Past Surgical History:  Procedure Laterality Date   ABDOMINAL HYSTERECTOMY  1974   Dr. Etta Grandchild, [facility] [place]   MITRAL VALVE REPAIR  2018   Dr. Lunette Stands, [facility] [place]    FAMILY HISTORY: Family History  Problem Relation Age of Onset   Parkinson's disease Mother  Dementia Mother    Prostate cancer Father    Stroke Sister    High Cholesterol Sister    Anxiety disorder Brother    Depression Brother    Panic disorder Brother    Dementia Maternal Grandfather    Parkinson's disease Maternal Grandfather     SOCIAL HISTORY: Social History   Socioeconomic History   Marital status: Married    Spouse name: John   Number of children: 2   Years of education: Not on file   Highest education level: Associate degree: occupational, Hotel manager, or vocational program  Occupational History   Not on file  Tobacco Use   Smoking status: Never   Smokeless tobacco: Never  Vaping Use   Vaping Use: Never used  Substance and Sexual Activity   Alcohol use: Yes    Comment: 1-2 drinks  a month    Drug use: Never   Sexual activity: Not on file  Other Topics Concern   Not on file  Social History Narrative   Social History      Diet? No       Do you drink/eat things with caffeine? 1/2 cup coffee, tea       Marital status?     Married                               What year were you married? 1965      Do you live in a house, apartment, assisted living, condo, trailer, etc.? Apartment       Is it one or more stories? No       How many persons live in your home? 2       Do you have any pets in your home? No       Highest level of education completed? 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         occasionaly                             Type & how often? Walking elliptical before covid       Advanced Directives      Do you have a living will? yes      Do you have a DNR form?                                  If not, do you want to discuss one? No       Do you have signed POA/HPOA for forms? yes      Functional Status      Do you have difficulty bathing or dressing yourself? No       Do you have difficulty preparing food or eating? No       Do you have difficulty managing your medications? No       Do you have difficulty managing your finances? No       Do you have difficulty affording your medications? No       Social Determinants of Health   Financial Resource Strain: Not on file  Food Insecurity: Not on file  Transportation Needs: Not on file  Physical Activity: Not on file  Stress: Not on file  Social Connections: Not on file  Intimate Partner Violence: Not on file     PHYSICAL EXAM  GENERAL EXAM/CONSTITUTIONAL: Vitals:  Vitals:   02/07/22 1055  BP: 118/89  Pulse: 64  Weight: 137 lb 6.4 oz (62.3 kg)  Height: 5' 3.5" (1.613 m)   Body mass index is 23.96 kg/m. Wt Readings from Last 3 Encounters:  02/07/22 137 lb 6.4 oz  (62.3 kg)  01/03/22 141 lb (64 kg)  11/02/21 138 lb 9.6 oz (62.9 kg)   Patient is in no distress; well developed, nourished and groomed; neck is supple  CARDIOVASCULAR: Examination of carotid arteries is normal; no carotid bruits Regular rate and rhythm, no murmurs Examination of peripheral vascular system by observation and palpation is normal  EYES: Ophthalmoscopic exam of optic discs and posterior segments is normal; no papilledema or hemorrhages No results found.  MUSCULOSKELETAL: Gait, strength, tone, movements noted in Neurologic exam below  NEUROLOGIC: MENTAL STATUS:     02/07/2022   11:03 AM  MMSE - Mini Mental State Exam  Orientation to time 4  Orientation to Place 3  Registration 3  Attention/ Calculation 2  Recall 0  Language- name 2 objects 2  Language- repeat 0  Language- follow 3 step command 3  Language- read & follow direction 1  Write a sentence 1  Copy design 1  Total score 20   awake, alert, oriented to person, place and time recent and remote memory intact normal attention and concentration language fluent, comprehension intact, naming intact fund of knowledge appropriate  CRANIAL NERVE:  2nd - no papilledema on fundoscopic exam 2nd, 3rd, 4th, 6th - pupils equal and reactive to light, visual fields full to confrontation, extraocular muscles intact, no nystagmus 5th - facial sensation symmetric 7th - facial strength symmetric 8th - hearing intact 9th - palate elevates symmetrically, uvula midline 11th - shoulder shrug symmetric 12th - tongue protrusion midline  MOTOR:  normal bulk and tone, full strength in the BUE, BLE  SENSORY:  normal and symmetric to light touch, temperature, vibration  COORDINATION:  finger-nose-finger, fine finger movements normal  REFLEXES:  deep tendon reflexes 1+ and symmetric  GAIT/STATION:  narrow based gait     DIAGNOSTIC DATA (LABS, IMAGING, TESTING) - I reviewed patient records, labs, notes,  testing and imaging myself where available.  Lab Results  Component Value Date   WBC 4.2 10/30/2021   HGB 15.0 10/30/2021   HCT 44.6 10/30/2021   MCV 95.1 10/30/2021   PLT 274 10/30/2021      Component Value Date/Time   NA 140 10/30/2021 1540   K 4.3 10/30/2021 1540   CL 104 10/30/2021 1540   CO2 24 10/30/2021 1540   GLUCOSE 89 10/30/2021 1540   BUN 17 10/30/2021 1540   CREATININE 1.07 (H) 10/30/2021 1540   CALCIUM 9.3 10/30/2021 1540   PROT 6.5 10/30/2021 1540   AST 19 10/30/2021 1540   ALT 13 10/30/2021 1540   BILITOT 0.4 10/30/2021 1540   GFRNONAA 57 (L) 10/04/2020 0740   GFRAA 66 10/04/2020 0740   Lab Results  Component Value Date   CHOL 182 10/30/2021   HDL 65 10/30/2021   LDLCALC 95 10/30/2021   TRIG 122 10/30/2021   CHOLHDL 2.8 10/30/2021   No results found for: "HGBA1C" No results found for: "VITAMINB12" Lab Results  Component Value Date   TSH 1.58 10/30/2021    12/12/00 CT head Marland Kitchen[place]) - normal [I reviewed images myself. -VRP]  ASSESSMENT AND PLAN  81 y.o. year old female here with:   Dx:  1. Memory loss      PLAN:  MILD MEMORY LOSS (since ~2022; MMSE 20/30; no major changes in ADLs) - could be mild cognitive impairment vs prodromal dementia; brain healthy activites reviewed - check MRI brain, B12  TENSION HEADACHES (since ~2003) - continue tylenol as needed  Orders Placed This Encounter  Procedures   MR BRAIN W WO CONTRAST   Vitamin B12   Return for pending if symptoms worsen or fail to improve, pending test results.    Penni Bombard, MD 12/31/4857, 27:63 AM Certified in Neurology, Neurophysiology and Neuroimaging  [facility] [address] [place], [place] [address] [phone]

## 2022-02-08 ENCOUNTER — Telehealth: Payer: Self-pay | Admitting: Diagnostic Neuroimaging

## 2022-02-08 NOTE — Telephone Encounter (Signed)
[employer] medicare sent to [facility] they obtain auth  

## 2022-02-15 ENCOUNTER — Ambulatory Visit: Payer: Medicare HMO | Admitting: Psychiatry

## 2022-03-01 ENCOUNTER — Non-Acute Institutional Stay: Payer: Medicare HMO | Admitting: Nurse Practitioner

## 2022-03-01 ENCOUNTER — Encounter: Payer: Self-pay | Admitting: Nurse Practitioner

## 2022-03-01 DIAGNOSIS — K219 Gastro-esophageal reflux disease without esophagitis: Secondary | ICD-10-CM | POA: Diagnosis not present

## 2022-03-01 DIAGNOSIS — F5105 Insomnia due to other mental disorder: Secondary | ICD-10-CM

## 2022-03-01 DIAGNOSIS — E785 Hyperlipidemia, unspecified: Secondary | ICD-10-CM | POA: Diagnosis not present

## 2022-03-01 DIAGNOSIS — Z9889 Other specified postprocedural states: Secondary | ICD-10-CM | POA: Diagnosis not present

## 2022-03-01 DIAGNOSIS — M5136 Other intervertebral disc degeneration, lumbar region: Secondary | ICD-10-CM | POA: Diagnosis not present

## 2022-03-01 DIAGNOSIS — E039 Hypothyroidism, unspecified: Secondary | ICD-10-CM | POA: Diagnosis not present

## 2022-03-01 DIAGNOSIS — N1831 Chronic kidney disease, stage 3a: Secondary | ICD-10-CM

## 2022-03-01 DIAGNOSIS — M51369 Other intervertebral disc degeneration, lumbar region without mention of lumbar back pain or lower extremity pain: Secondary | ICD-10-CM

## 2022-03-01 DIAGNOSIS — M8580 Other specified disorders of bone density and structure, unspecified site: Secondary | ICD-10-CM

## 2022-03-01 DIAGNOSIS — R69 Illness, unspecified: Secondary | ICD-10-CM | POA: Diagnosis not present

## 2022-03-01 DIAGNOSIS — F418 Other specified anxiety disorders: Secondary | ICD-10-CM

## 2022-03-01 DIAGNOSIS — G43109 Migraine with aura, not intractable, without status migrainosus: Secondary | ICD-10-CM

## 2022-03-01 NOTE — Assessment & Plan Note (Signed)
no recent fx. on Ca Vit D 

## 2022-03-01 NOTE — Assessment & Plan Note (Addendum)
Hx of ocular migraine, top of head and neck discomfort at times, not new, no neurological deficits symptoms today. Mother had Parkinson/dementia. taking sinus Acetaminophen, pending MRI brain since Hx of mitral valve repair from Neurology.

## 2022-03-01 NOTE — Assessment & Plan Note (Signed)
prn Lorazepam, Sertraline. Denied depression.

## 2022-03-01 NOTE — Assessment & Plan Note (Signed)
stable, off Esomeprazole

## 2022-03-01 NOTE — Assessment & Plan Note (Signed)
Levothyroxine, TSH 1.58 10/30/21

## 2022-03-01 NOTE — Progress Notes (Signed)
Location:   Clinic [facility]   Place of Service:  Clinic (12) Provider: Marlana Latus NP  Code Status: DNR Goals of Care: [place]    03/01/2022    8:05 AM  Advanced Directives  Does Patient Have a Medical Advance Directive? Yes  Does patient want to make changes to medical advance directive? No - Patient declined     Chief Complaint  Patient presents with   Medical Management of Chronic Issues    Patient is here for a 79M F/U, discuss need for updated vaccines, Advanced care directives, and fall screening    HPI: Patient is a 81 y.o. female seen today for medical management of chronic diseases.      Hx of ocular migraine, top of head and neck discomfort at times, not new, no neurological deficits symptoms today. Mother had Parkinson/dementia. pending MRI             Hypothyroidism Levothyroxine, TSH 1.58 10/30/21             Depression/anxieyt, prn Lorazepam, Sertraline. Denied depression.              CKD Bun/creat 17/1.07 eGFR 53 10/30/21             S/p Mitral valve repair             Chronic lower back pain, f/u Ortho, s/p inj x3 2023             Hyperlipidemia, LDL 95 10/30/21 takes Pravastatin, ASA             Osteopenia, no recent fx. on Ca Vit D             GERD ,stable, off Esomeprazole  Past Medical History:  Diagnosis Date   Anxiety and depression    DDD (degenerative disc disease), lumbar    History of repair of mitral valve    Hypertensive kidney disease    Hypothyroid     Past Surgical History:  Procedure Laterality Date   ABDOMINAL HYSTERECTOMY  1974   Dr. Etta Grandchild, [facility] [place]   MITRAL VALVE REPAIR  2018   Dr. Lunette Stands, [place]    Allergies  Allergen Reactions   Penicillin G Benzathine & Proc Other (See Comments)    Unknown.    Allergies as of 03/01/2022       Reactions   Penicillin G Benzathine & Proc Other (See Comments)   Unknown.        Medication List        Accurate as of March 01, 2022  3:40 PM. If you have any questions, ask your nurse or doctor.          STOP taking these medications    meloxicam 15 MG tablet Commonly known as: MOBIC Stopped by: Trigg Delarocha X Roxi Hlavaty, NP       TAKE these medications    aspirin 81 MG chewable tablet Chew by mouth daily.   b complex vitamins tablet Take 1 tablet by mouth daily.   calcium-vitamin D 250-125 MG-UNIT tablet Commonly known as: OSCAL WITH D Take 1 tablet by mouth daily. Dose unknown-Patient to find out.   cholecalciferol 25 MCG (1000 UNIT) tablet Commonly known as: VITAMIN D3 Take 1,000 Units by mouth daily.   clindamycin 300 MG capsule Commonly known as: CLEOCIN Take 300 mg by mouth. Prior to dental work   gabapentin 100 MG capsule Commonly known as: NEURONTIN  TAKE TWO CAPSULES BY MOUTH DAILY AT BEDTIME   levothyroxine 25 MCG tablet Commonly known as: SYNTHROID PLEASE SEE ATTACHED FOR DETAILED DIRECTIONS   LORazepam 0.5 MG tablet Commonly known as: ATIVAN TAKE 1/2 TO 1 TABLET BY MOUTH AT BEDTIME AS NEEDED FOR ANXIETY   MAGNESIUM GLYCINATE PO Take 2 capsules by mouth daily.   sertraline 50 MG tablet Commonly known as: ZOLOFT TAKE 1/2 TABLET BY MOUTH EVERY DAY   vitamin C 100 MG tablet Take 100 mg by mouth daily.        Review of Systems:  Review of Systems  Constitutional:  Negative for fatigue and fever.  HENT:  Negative for congestion and voice change.   Eyes:  Negative for visual disturbance.       Dry eyes  Respiratory:  Negative for cough, shortness of breath and wheezing.   Cardiovascular:  Negative for leg swelling.  Gastrointestinal:  Negative for abdominal pain and constipation.       Hemorrhoidal external from previous exam  Genitourinary:  Negative for dysuria and frequency.  Musculoskeletal:  Positive for arthralgias. Negative for gait problem.  Skin:  Negative for color change.       Hair thinning on top of head.   Neurological:  Positive for headaches. Negative for  tremors, seizures, facial asymmetry, speech difficulty, weakness and light-headedness.       Memory lapses. Top of head, neck discomfort not new  Psychiatric/Behavioral:  Positive for sleep disturbance. Negative for confusion. The patient is not nervous/anxious.        Occasional      Health Maintenance  Topic Date Due   Zoster Vaccines- Shingrix (1 of 2) Never done   DEXA SCAN  Never done   COVID-19 Vaccine (4 - Mixed Product risk series) 05/30/2021   TETANUS/TDAP  08/15/2021   INFLUENZA VACCINE  02/13/2022   Pneumonia Vaccine 65+ Years old  Completed   Hepatitis C Screening  Completed   HPV VACCINES  Aged Out    Physical Exam: Vitals:   03/01/22 1457  BP: 120/68  Pulse: 63  Resp: 18  Temp: (!) 97.3 F (36.3 C)  SpO2: 90%  Height: 5' 3.5" (1.613 m)   Body mass index is 23.96 kg/m. Physical Exam Vitals and nursing note reviewed.  Constitutional:      Appearance: Normal appearance.  HENT:     Head: Normocephalic and atraumatic.     Nose: Nose normal.     Mouth/Throat:     Mouth: Mucous membranes are moist.     Pharynx: No oropharyngeal exudate or posterior oropharyngeal erythema.     Comments: Sore spot lateral left throat, in white color.  Eyes:     Conjunctiva/sclera: Conjunctivae normal.     Pupils: Pupils are equal, round, and reactive to light.     Comments: dry eyes  Cardiovascular:     Rate and Rhythm: Normal rate and regular rhythm.     Heart sounds: No murmur heard. Pulmonary:     Effort: Pulmonary effort is normal.     Breath sounds: No rales.  Abdominal:     General: Bowel sounds are normal.     Palpations: Abdomen is soft.     Tenderness: There is no abdominal tenderness.  Genitourinary:    Comments: Hemorrhoids external. Breast exam  Musculoskeletal:     Cervical back: Normal range of motion and neck supple.     Right lower leg: No edema.     Left lower leg: No edema.    Skin:    General: Skin is warm and dry.  Neurological:     General: No  focal deficit present.     Mental Status: She is alert and oriented to person, place, and time. Mental status is at baseline.     Gait: Gait normal.  Psychiatric:        Mood and Affect: Mood normal.        Behavior: Behavior normal.        Thought Content: Thought content normal.        Judgment: Judgment normal.     Labs reviewed: Basic Metabolic Panel: Recent Labs    10/30/21 1540  NA 140  K 4.3  CL 104  CO2 24  GLUCOSE 89  BUN 17  CREATININE 1.07*  CALCIUM 9.3  TSH 1.58   Liver Function Tests: Recent Labs    10/30/21 1540  AST 19  ALT 13  BILITOT 0.4  PROT 6.5   No results for input(s): "LIPASE", "AMYLASE" in the last 8760 hours. No results for input(s): "AMMONIA" in the last 8760 hours. CBC: Recent Labs    10/30/21 1540  WBC 4.2  NEUTROABS 1,873  HGB 15.0  HCT 44.6  MCV 95.1  PLT 274   Lipid Panel: Recent Labs    10/30/21 1540  CHOL 182  HDL 65  LDLCALC 95  TRIG 122  CHOLHDL 2.8   No results found for: "HGBA1C"  Procedures since last visit: No results found.  Assessment/Plan  Ocular migraine Hx of ocular migraine, top of head and neck discomfort at times, not new, no neurological deficits symptoms today. Mother had Parkinson/dementia. taking sinus Acetaminophen, pending MRI brain since Hx of mitral valve repair from Neurology.   Hypothyroidism Levothyroxine, TSH 1.58 10/30/21  Insomnia secondary to depression with anxiety prn Lorazepam, Sertraline. Denied depression.   CKD (chronic kidney disease) stage 3, GFR 30-59 ml/min (HCC) Bun/creat 17/1.07 eGFR 53 10/30/21  History of mitral valve repair S/p Mitral valve repair 2018 [facility]. F/u yearly.   Lumbar degenerative disc disease f/u Ortho, inj x3, not taking Gabapentin, Mobic  Hyperlipidemia LDL 95 10/30/21 takes Pravastatin, ASA  Osteopenia no recent fx. on Ca Vit D  GERD (gastroesophageal reflux disease) stable, off Esomeprazole   Labs/tests ordered:  none  Next  appt:  6 months.    

## 2022-03-01 NOTE — Assessment & Plan Note (Addendum)
f/u Ortho, inj x3, not taking Gabapentin, Mobic

## 2022-03-01 NOTE — Assessment & Plan Note (Signed)
LDL 95 10/30/21 takes Pravastatin, ASA 

## 2022-03-01 NOTE — Assessment & Plan Note (Signed)
Bun/creat 17/1.07 eGFR 53 10/30/21

## 2022-03-01 NOTE — Assessment & Plan Note (Addendum)
S/p Mitral valve repair 2018 [facility]. F/u yearly.

## 2022-03-06 NOTE — Progress Notes (Unsigned)
Kristine [name] [address] [place] Phone: [phone] Subjective:   IVilma [name], am serving as a scribe for Dr. Hulan Saas.  I'm seeing this patient by the request  of:  Mast, Man X, NP  CC: Neck pain and headache  [id number]  01/03/2022 Worsening symptoms.  Loss of lordosis patient feels like the injections have been helpful.  Do feel that advanced imaging would be warranted if patient does not have any significant improvement at this time.  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  We will see how patient responds and follow-up with me otherwise in 6 to 8 weeks.  Could also be a candidate may be for radiofrequency ablation if this does not help  History of degenerative disc and worsening symptoms. I do believe the patient would respond well to an epidural at C7 and T1 as well.  Patient is traveling and feels like this will be more beneficial.  Has not had great luck with different oral medications including the meloxicam.  Patient will follow-up with me again in 6 to 8 weeks otherwise.  Updated 03/07/2022 Kristine [name] is a 81 y.o. female coming in with complaint of neck and back pain. Having really bad top of the head headaches. Back is doing better. No new issues.  Patient states have a headache seems to be unrelenting.  Did review patient's chart including neurology.  Patient is scheduled for an MRI.  The patient feels like the headache is somewhat worsening.  Denies any nausea or vomiting.  States that it seems to be from the top of the head sometimes in the temporal artery area and into the neck.       Past Medical History:  Diagnosis Date   Anxiety and depression    DDD (degenerative disc disease), lumbar    History of repair of mitral valve    Hypertensive kidney disease    Hypothyroid    Past Surgical History:   Procedure Laterality Date   ABDOMINAL HYSTERECTOMY  1974   Dr. Etta Grandchild, [facility] [place]   MITRAL VALVE REPAIR  2018   Dr. Lunette Stands, [place]   Social History   Socioeconomic History   Marital status: Married    Spouse name: John   Number of children: 2   Years of education: Not on file   Highest education level: Associate degree: occupational, Hotel manager, or vocational program  Occupational History   Not on file  Tobacco Use   Smoking status: Never   Smokeless tobacco: Never  Vaping Use   Vaping Use: Never used  Substance and Sexual Activity   Alcohol use: Yes    Comment: 1-2 drinks a month    Drug use: Never   Sexual activity: Not on file  Other Topics Concern   Not on file  Social History Narrative   Social History      Diet? No       Do you drink/eat things with caffeine? 1/2 cup coffee, tea       Marital status?     Married                               What year were you married? 1965  Do you live in a house, apartment, assisted living, condo, trailer, etc.? Apartment       Is it one or more stories? No       How many persons live in your home? 2       Do you have any pets in your home? No       Highest level of education completed? 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         occasionaly                             Type & how often? Walking elliptical before covid       Advanced Directives      Do you have a living will? yes      Do you have a DNR form?                                  If not, do you want to discuss one? No       Do you have signed POA/HPOA for forms? yes      Functional Status      Do you have difficulty bathing or dressing yourself? No       Do you have difficulty preparing food or eating? No       Do you have difficulty managing your medications? No        Do you have difficulty managing your finances? No       Do you have difficulty affording your medications? No       Social Determinants of Radio broadcast assistant Strain: Not on file  Food Insecurity: Not on file  Transportation Needs: Not on file  Physical Activity: Not on file  Stress: Not on file  Social Connections: Not on file   Allergies  Allergen Reactions   Penicillin G Benzathine & Proc Other (See Comments)    Unknown.   Family History  Problem Relation Age of Onset   Parkinson's disease Mother    Dementia Mother    Prostate cancer Father    Stroke Sister    High Cholesterol Sister    Anxiety disorder Brother    Depression Brother    Panic disorder Brother    Dementia Maternal Grandfather    Parkinson's disease Maternal Grandfather     Current Outpatient Medications (Endocrine & Metabolic):    levothyroxine (SYNTHROID) 25 MCG tablet, PLEASE SEE ATTACHED FOR DETAILED DIRECTIONS    Current Outpatient Medications (Analgesics):    aspirin 81 MG chewable tablet, Chew by mouth daily.   Current Outpatient Medications (Other):    Ascorbic Acid (VITAMIN C) 100 MG tablet, Take 100 mg by mouth daily.   b complex vitamins tablet, Take 1 tablet by mouth daily.   calcium-vitamin D (OSCAL WITH D) 250-125 MG-UNIT tablet, Take 1 tablet by mouth daily. Dose unknown-Patient to find out.   cholecalciferol (VITAMIN D3) 25 MCG (1000 UT) tablet, Take 1,000 Units by mouth daily.   clindamycin (CLEOCIN) 300 MG capsule, Take 300 mg by mouth. Prior to dental work   gabapentin (NEURONTIN) 100 MG capsule, TAKE TWO CAPSULES BY MOUTH DAILY AT BEDTIME   LORazepam (ATIVAN) 0.5 MG tablet, TAKE 1/2 TO 1  TABLET BY MOUTH AT BEDTIME AS NEEDED FOR ANXIETY   MAGNESIUM GLYCINATE PO, Take 2 capsules by mouth daily.    sertraline (ZOLOFT) 50 MG tablet, TAKE 1/2 TABLET BY MOUTH EVERY DAY   Reviewed prior external information including notes and imaging from  primary care provider As well as  notes that were available from care everywhere and other healthcare systems.  Past medical history, social, surgical and family history all reviewed in electronic medical record.  No pertanent information unless stated regarding to the chief complaint.   Review of Systems:  No , visual changes, nausea, vomiting, diarrhea, constipation, dizziness, abdominal pain, skin rash, fevers, chills, night sweats, weight loss, swollen lymph nodes, body aches, joint swelling, chest pain, shortness of breath, mood changes. POSITIVE muscle aches, headache  Objective  Blood pressure 126/80, pulse 65, height '5\' 3"'$  (1.6 m), weight 140 lb (63.5 kg), SpO2 98 %.   General: No apparent distress alert and oriented x3 mood and affect normal, dressed appropriately.  HEENT: Pupils equal, extraocular movements intact  Respiratory: Patient's speak in full sentences and does not appear short of breath  Cardiovascular: No lower extremity edema, non tender, no erythema   Neck exam does have some loss of lordosis.  Patient does seem to be uncomfortable with the headache.  The patient extraocular movements are intact.  Neurovascularly intact in all the extremities.  Patient once again just appears to be uncomfortable more than anything else.  No increase in tenderness over the temporal area.    Impression and Recommendations:    The above documentation has been reviewed and is accurate and complete Lyndal Pulley, DO

## 2022-03-07 ENCOUNTER — Ambulatory Visit: Payer: Medicare HMO | Admitting: Family Medicine

## 2022-03-07 VITALS — BP 126/80 | HR 65 | Ht 63.0 in | Wt 140.0 lb

## 2022-03-07 DIAGNOSIS — M5030 Other cervical disc degeneration, unspecified cervical region: Secondary | ICD-10-CM | POA: Diagnosis not present

## 2022-03-07 DIAGNOSIS — M2550 Pain in unspecified joint: Secondary | ICD-10-CM | POA: Diagnosis not present

## 2022-03-07 LAB — COMPREHENSIVE METABOLIC PANEL
ALT: 16 U/L (ref 0–35)
AST: 21 U/L (ref 0–37)
Albumin: 4.3 g/dL (ref 3.5–5.2)
Alkaline Phosphatase: 60 U/L (ref 39–117)
BUN: 19 mg/dL (ref 6–23)
CO2: 25 mEq/L (ref 19–32)
Calcium: 9 mg/dL (ref 8.4–10.5)
Chloride: 98 mEq/L (ref 96–112)
Creatinine, Ser: 0.89 mg/dL (ref 0.40–1.20)
GFR: 61.92 mL/min (ref >60.00)
Glucose, Bld: 78 mg/dL (ref 70–99)
Potassium: 4.4 mEq/L (ref 3.5–5.1)
Sodium: 131 mEq/L — ABNORMAL LOW (ref 135–145)
Total Bilirubin: 0.4 mg/dL (ref 0.2–1.2)
Total Protein: 6.8 g/dL (ref 6.0–8.3)

## 2022-03-07 LAB — CBC WITH DIFFERENTIAL/PLATELET
Basophils Absolute: 0 10*3/uL (ref 0.0–0.1)
Basophils Relative: 0.4 % (ref 0.0–3.0)
Eosinophils Absolute: 0 10*3/uL (ref 0.0–0.7)
Eosinophils Relative: 0.6 % (ref 0.0–5.0)
HCT: 41.3 % (ref 36.0–46.0)
Hemoglobin: 14 g/dL (ref 12.0–15.0)
Lymphocytes Relative: 32.6 % (ref 12.0–46.0)
Lymphs Abs: 2.2 10*3/uL (ref 0.7–4.0)
MCHC: 34 g/dL (ref 30.0–36.0)
MCV: 95.4 fl (ref 78.0–100.0)
Monocytes Absolute: 0.6 10*3/uL (ref 0.1–1.0)
Monocytes Relative: 9.2 % (ref 3.0–12.0)
Neutro Abs: 3.8 10*3/uL (ref 1.4–7.7)
Neutrophils Relative %: 57.2 % (ref 43.0–77.0)
Platelets: 270 10*3/uL (ref 150.0–400.0)
RBC: 4.33 Mil/uL (ref 3.87–5.11)
RDW: 13.4 % (ref 11.5–15.5)
WBC: 6.7 10*3/uL (ref 4.0–10.5)

## 2022-03-07 LAB — IBC PANEL
Iron: 138 ug/dL (ref 42–145)
Saturation Ratios: 27.9 % (ref 20.0–50.0)
TIBC: 494.2 ug/dL — ABNORMAL HIGH (ref 250.0–450.0)
Transferrin: 353 mg/dL (ref 212.0–360.0)

## 2022-03-07 LAB — SEDIMENTATION RATE: Sed Rate: 6 mm/hr (ref 0–30)

## 2022-03-07 LAB — FERRITIN: Ferritin: 41.2 ng/mL (ref 10.0–291.0)

## 2022-03-07 NOTE — Assessment & Plan Note (Signed)
Known arthritic changes of the neck noted.  We discussed with patient about the gabapentin.  Having significant worsening of pain and headache that is severe.  We discussed with patient that if any worsening such as nausea and vomiting also occur that she needs to seek medical attention immediately.  Laboratory work-up to rule out any vasculitis.  The patient is scheduled for an MRI this Sunday.  Patient knows though if any worsening symptoms to seek medical attention before that.  Depending on imaging we will see if that changes management as well as the possibility for the laboratory work-up.  And can follow-up with me again in 4 weeks to further evaluate for low back in the neck pain in general.

## 2022-03-07 NOTE — Patient Instructions (Addendum)
Get MRI on Sunday If headaches get worse, nausea vomiting or vision changes go to ER  Labs today

## 2022-03-11 ENCOUNTER — Ambulatory Visit
Admission: RE | Admit: 2022-03-11 | Discharge: 2022-03-11 | Disposition: A | Discharge status: Home / Self Care | Payer: Medicare HMO | Source: Ambulatory Visit | Attending: Diagnostic Neuroimaging | Admitting: Diagnostic Neuroimaging

## 2022-03-11 DIAGNOSIS — R413 Other amnesia: Secondary | ICD-10-CM | POA: Diagnosis not present

## 2022-03-11 MED ORDER — GADOBENATE DIMEGLUMINE 529 MG/ML IV SOLN
12.0000 mL | Freq: Once | INTRAVENOUS | Status: AC | PRN
  Administered 2022-03-11: 12 mL via INTRAVENOUS

## 2022-03-15 NOTE — Telephone Encounter (Signed)
Pt called office for results, I relayed and explained them to her. She was appreciative.

## 2022-04-04 ENCOUNTER — Other Ambulatory Visit: Payer: Self-pay | Admitting: Orthopedic Surgery

## 2022-04-04 NOTE — Telephone Encounter (Signed)
Patient has request refill on medication Lorazepam 0.'5mg'$ . Patient medication last refilled 01/08/2022. Patient has Non Opioid Contract on file dated 08/03/2021. Medication pend and sent to PCP Mast, Man X, NP for approval. Please Advise.

## 2022-04-12 ENCOUNTER — Non-Acute Institutional Stay: Payer: Medicare HMO | Admitting: Nurse Practitioner

## 2022-04-12 ENCOUNTER — Encounter: Payer: Self-pay | Admitting: Nurse Practitioner

## 2022-04-12 DIAGNOSIS — R69 Illness, unspecified: Secondary | ICD-10-CM | POA: Diagnosis not present

## 2022-04-12 DIAGNOSIS — E785 Hyperlipidemia, unspecified: Secondary | ICD-10-CM

## 2022-04-12 DIAGNOSIS — M5136 Other intervertebral disc degeneration, lumbar region: Secondary | ICD-10-CM

## 2022-04-12 DIAGNOSIS — R001 Bradycardia, unspecified: Secondary | ICD-10-CM

## 2022-04-12 DIAGNOSIS — G43109 Migraine with aura, not intractable, without status migrainosus: Secondary | ICD-10-CM | POA: Diagnosis not present

## 2022-04-12 DIAGNOSIS — N1831 Chronic kidney disease, stage 3a: Secondary | ICD-10-CM

## 2022-04-12 DIAGNOSIS — K219 Gastro-esophageal reflux disease without esophagitis: Secondary | ICD-10-CM

## 2022-04-12 DIAGNOSIS — E871 Hypo-osmolality and hyponatremia: Secondary | ICD-10-CM

## 2022-04-12 DIAGNOSIS — E039 Hypothyroidism, unspecified: Secondary | ICD-10-CM

## 2022-04-12 DIAGNOSIS — M8580 Other specified disorders of bone density and structure, unspecified site: Secondary | ICD-10-CM | POA: Diagnosis not present

## 2022-04-12 DIAGNOSIS — F3341 Major depressive disorder, recurrent, in partial remission: Secondary | ICD-10-CM

## 2022-04-12 DIAGNOSIS — M51369 Other intervertebral disc degeneration, lumbar region without mention of lumbar back pain or lower extremity pain: Secondary | ICD-10-CM

## 2022-04-12 NOTE — Assessment & Plan Note (Signed)
stable, off Esomeprazole, Hgb 14.0 03/07/22 

## 2022-04-12 NOTE — Assessment & Plan Note (Signed)
Na 131 03/07/22, repeat BMP

## 2022-04-12 NOTE — Assessment & Plan Note (Signed)
takes  Levothyroxine, TSH 1.58 10/30/21

## 2022-04-12 NOTE — Assessment & Plan Note (Signed)
LDL 95 10/30/21 takes Pravastatin, ASA 

## 2022-04-12 NOTE — Progress Notes (Signed)
130. Location:  [place] of Service:  Clinic (12) Provider:  Anayla Giannetti X, NP  Patient Care Team: Aeson Sawyers X, NP as PCP - General (Internal Medicine)  Extended Emergency Contact Information Primary Emergency Contact: Shary Key Address: [address] [place]          [place], [place] [address] Johnnette Litter of [place] Phone: [phone] Mobile Phone: [phone] Relation: Spouse Secondary Emergency Contact: Modena Nunnery Address: [address]          [place], [place] [address] Johnnette Litter of [place] Work Phone: [phone] Mobile Phone: [phone] Relation: Daughter  Code Status:  FULL Goals of care: Advanced Directive information    03/01/2022    8:05 AM  Advanced Directives  Does Patient Have a Medical Advance Directive? Yes  Does patient want to make changes to medical advance directive? No - Patient declined     Chief Complaint  Patient presents with   Acute Visit    Patient is being seen for headaches    HPI:  Pt is a 81 y.o. female seen today for an acute visit for headaches    Hx of ocular migraine, top of head and neck discomfort at times, not new, no neurological deficits symptoms today. Mother had Parkinson/dementia. 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              Hypothyroidism, takes  Levothyroxine, TSH 1.58 10/30/21             Depression/anxieyt, prn Lorazepam, Sertraline. Denied depression.              CKD Bun/creat 19/0.89 03/07/22  Hyponatremia, Na 131 03/07/22             S/p Mitral valve repair             Chronic lower back pain, f/u Ortho, s/p inj x3 2023             Hyperlipidemia, LDL 95 10/30/21 takes Pravastatin, ASA             Osteopenia, no recent fx. on Ca Vit D             GERD ,stable, off Esomeprazole, Hgb 14.0 03/07/22  Bradycardia, HR in 50,  asymptomatic.    Past Medical History:  Diagnosis Date   Anxiety and depression    DDD (degenerative disc disease), lumbar    History of repair of mitral valve    Hypertensive kidney disease    Hypothyroid    Past Surgical History:  Procedure Laterality Date   ABDOMINAL HYSTERECTOMY  1974   Dr. Etta Grandchild, [facility] [place]   MITRAL VALVE REPAIR  2018   Dr. Lunette Stands, [place]    Allergies  Allergen Reactions   Penicillin G Benzathine & Proc Other (See Comments)    Unknown.    Outpatient Encounter Medications as of 04/12/2022  Medication Sig   Ascorbic Acid (VITAMIN C) 100 MG tablet Take 100 mg by mouth daily.   aspirin 81 MG chewable tablet Chew by mouth daily.   b complex vitamins tablet Take 1 tablet by mouth daily.   calcium-vitamin D (OSCAL WITH D) 250-125 MG-UNIT tablet Take 1 tablet by mouth daily. Dose unknown-Patient to find out.   cholecalciferol (VITAMIN D3) 25 MCG (1000 UT)  tablet Take 1,000 Units by mouth daily.   clindamycin (CLEOCIN) 300 MG capsule Take 300 mg by mouth. Prior to dental work   gabapentin (NEURONTIN) 100 MG capsule TAKE TWO CAPSULES BY MOUTH DAILY AT BEDTIME   levothyroxine (SYNTHROID) 25 MCG tablet PLEASE SEE ATTACHED FOR DETAILED DIRECTIONS   LORazepam (ATIVAN) 0.5 MG tablet TAKE 1/2 TO 1 TABLET BY MOUTH AT BEDTIME AS NEEDED FOR ANXIETY   MAGNESIUM GLYCINATE PO Take 2 capsules by mouth daily.    sertraline (ZOLOFT) 50 MG tablet TAKE 1/2 TABLET BY MOUTH EVERY DAY   No facility-administered encounter medications on file as of 04/12/2022.    Review of Systems  Constitutional:  Negative for fatigue and fever.  HENT:  Negative for congestion and voice change.   Eyes:  Negative for visual disturbance.       Dry eyes  Respiratory:  Negative for cough, shortness of breath and wheezing.   Cardiovascular:  Negative for leg swelling.  Gastrointestinal:  Negative for abdominal pain and constipation.        Hemorrhoidal external from previous exam  Genitourinary:  Negative for dysuria and frequency.  Musculoskeletal:  Positive for arthralgias. Negative for gait problem.  Skin:  Negative for color change.       Hair thinning on top of head.   Neurological:  Positive for headaches. Negative for tremors, seizures, facial asymmetry, speech difficulty, weakness and light-headedness.       Memory lapses. Top of head, neck discomfort not new  Psychiatric/Behavioral:  Positive for sleep disturbance. Negative for confusion. The patient is not nervous/anxious.        Occasional     Immunization History  Administered Date(s) Administered   Hepatitis A 05/24/2000   Hepatitis A, Ped/Adol-2 Dose 05/24/2000   IPV 05/24/2000   Influenza, High Dose Seasonal PF 04/15/2021   Moderna SARS-COV2 Booster Vaccination 05/24/2020, 11/14/2020   Moderna Sars-Covid-2 Vaccination 07/20/2019, 08/17/2019   Pfizer Covid-19 Vaccine Bivalent Booster 42yr & up 04/04/2021   Pneumococcal Conjugate-13 10/21/2015   Pneumococcal Polysaccharide-23 07/12/2008   Tdap 08/16/2011   Zoster, Live 07/16/2009   Pertinent  Health Maintenance Due  Topic Date Due   DEXA SCAN  Never done   INFLUENZA VACCINE  02/13/2022      02/01/2021    2:32 PM 07/31/2021   11:16 AM 08/03/2021    3:09 PM 08/24/2021    1:28 PM 09/05/2021    9:34 AM  Fall Risk  Falls in the past year? 0 0 0 0 0  Was there an injury with Fall? 0 0 0 0 0  Fall Risk Category Calculator 0 0 0 0 0  Fall Risk Category Low Low Low Low Low  Patient Fall Risk Level Low fall risk Low fall risk Low fall risk Low fall risk Low fall risk  Patient at Risk for Falls Due to History of fall(s);No Fall Risks No Fall Risks No Fall Risks History of fall(s) History of fall(s)  Fall risk Follow up Falls evaluation completed;Education provided;Falls prevention discussed Falls evaluation completed Falls evaluation completed Falls evaluation completed;Education provided;Falls  prevention discussed Education provided;Falls prevention discussed;Falls evaluation completed   Functional Status Survey:    Vitals:   04/12/22 0919  BP: 130/80  Pulse: (!) 56  Resp: 16  Temp: (!) 97.2 F (36.2 C)  SpO2: 95%  Weight: 140 lb (63.5 kg)  Height: '5\' 3"'$  (1.6 m)   Body mass index is 24.8 kg/m. Physical Exam Vitals and nursing note reviewed.  Constitutional:  Appearance: Normal appearance.  HENT:     Head: Normocephalic and atraumatic.     Nose: Nose normal.     Mouth/Throat:     Mouth: Mucous membranes are moist.     Pharynx: No oropharyngeal exudate or posterior oropharyngeal erythema.     Comments: Sore spot lateral left throat, in white color.  Eyes:     Conjunctiva/sclera: Conjunctivae normal.     Pupils: Pupils are equal, round, and reactive to light.     Comments: dry eyes  Cardiovascular:     Rate and Rhythm: Regular rhythm. Bradycardia present.     Heart sounds: No murmur heard.    Comments: HR 50s Pulmonary:     Effort: Pulmonary effort is normal.     Breath sounds: No rales.  Abdominal:     General: Bowel sounds are normal.     Palpations: Abdomen is soft.     Tenderness: There is no abdominal tenderness.  Genitourinary:    Comments: Hemorrhoids external. Breast exam  Musculoskeletal:     Cervical back: Normal range of motion and neck supple.     Right lower leg: No edema.     Left lower leg: No edema.  Skin:    General: Skin is warm and dry.  Neurological:     General: No focal deficit present.     Mental Status: She is alert and oriented to person, place, and time. Mental status is at baseline.     Gait: Gait normal.  Psychiatric:        Mood and Affect: Mood normal.        Behavior: Behavior normal.        Thought Content: Thought content normal.        Judgment: Judgment normal.     Labs reviewed: Recent Labs    10/30/21 1540 03/07/22 1432  NA 140 131*  K 4.3 4.4  CL 104 98  CO2 24 25  GLUCOSE 89 78  BUN 17 19   CREATININE 1.07* 0.89  CALCIUM 9.3 9.0   Recent Labs    10/30/21 1540 03/07/22 1432  AST 19 21  ALT 13 16  ALKPHOS  --  60  BILITOT 0.4 0.4  PROT 6.5 6.8  ALBUMIN  --  4.3   Recent Labs    10/30/21 1540 03/07/22 1432  WBC 4.2 6.7  NEUTROABS 1,873 3.8  HGB 15.0 14.0  HCT 44.6 41.3  MCV 95.1 95.4  PLT 274 270.0   Lab Results  Component Value Date   TSH 1.58 10/30/2021   No results found for: "HGBA1C" Lab Results  Component Value Date   CHOL 182 10/30/2021   HDL 65 10/30/2021   LDLCALC 95 10/30/2021   TRIG 122 10/30/2021   CHOLHDL 2.8 10/30/2021    Significant Diagnostic Results in last 30 days:  No results found.  Assessment/Plan Hyponatremia Na 131 03/07/22, repeat BMP   Ocular migraine Hx of ocular migraine, left eye and left  top of head and neck discomfort at times, not new, no neurological deficits symptoms today. Mother had Parkinson/dementia. followed by neurology, MRI mild chronic small vessel ischemic changes. Recently taking Tylenol 2-3x/day, effective.  Resume Gabapentin '100mg'$  qhs, may take up to 3x/day as needed.   Hypothyroidism  takes  Levothyroxine, TSH 1.58 10/30/21  Recurrent major depressive disorder (HCC)  prn Lorazepam, Sertraline. Denied depression.   CKD (chronic kidney disease) stage 3, GFR 30-59 ml/min (HCC) Bun/creat 19/0.89 03/07/22  Lumbar degenerative disc disease  f/u Ortho,  s/p inj x3 2023  Hyperlipidemia LDL 95 10/30/21 takes Pravastatin, ASA  Osteopenia no recent fx. on Ca Vit D  GERD (gastroesophageal reflux disease) stable, off Esomeprazole, Hgb 14.0 03/07/22  Bradycardia HR in 50, asymptomatic.      Family/ staff Communication: plan of care reviewed with the patient and charge nurse.   Labs/tests ordered:  BMP one week.   Next appointment 3 months.

## 2022-04-12 NOTE — Assessment & Plan Note (Addendum)
Hx of ocular migraine, left eye and left  top of head and neck discomfort at times, not new, no neurological deficits symptoms today. Mother had Parkinson/dementia. followed by neurology, MRI mild chronic small vessel ischemic changes. Recently taking Tylenol 2-3x/day, effective.  Resume Gabapentin '100mg'$  qhs, may take up to 3x/day as needed.

## 2022-04-12 NOTE — Assessment & Plan Note (Signed)
HR in 50, asymptomatic.

## 2022-04-12 NOTE — Assessment & Plan Note (Signed)
no recent fx. on Ca Vit D 

## 2022-04-12 NOTE — Assessment & Plan Note (Signed)
Bun/creat 19/0.89 03/07/22 

## 2022-04-12 NOTE — Assessment & Plan Note (Signed)
prn Lorazepam, Sertraline. Denied depression.

## 2022-04-12 NOTE — Assessment & Plan Note (Signed)
f/u Ortho, s/p inj x3 2023

## 2022-04-16 ENCOUNTER — Encounter: Payer: Self-pay | Admitting: Nurse Practitioner

## 2022-04-23 NOTE — Progress Notes (Unsigned)
[name] [address] [place] Phone: [phone] Subjective:   [name], am serving as a scribe for Dr. Hulan Saas.  I'm seeing this patient by the request  of:  Mast, Man X, NP  CC: Neck and lower back pain follow-up  DVV:[id number]  03/07/2022 Known arthritic changes of the neck noted.  We discussed with patient about the gabapentin.  Having significant worsening of pain and headache that is severe.  We discussed with patient that if any worsening such as nausea and vomiting also occur that she needs to seek medical attention immediately.  Laboratory work-up to rule out any vasculitis.  The patient is scheduled for an MRI this Sunday.  Patient knows though if any worsening symptoms to seek medical attention before that.  Depending on imaging we will see if that changes management as well as the possibility for the laboratory work-up.  And can follow-up with me again in 4 weeks to further evaluate for low back in the neck pain in general.  Updated 04/25/2022 Kristine [name] is a 81 y.o. female coming in with complaint of back pain. Doing well. Moving in a good direction. One spot on the right side of back irritates her sporadically. Her neck has also been bothering her lately. 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  No acute abnormalities are noted.     Past Medical History:  Diagnosis Date   Anxiety and depression    DDD (degenerative disc disease), lumbar    History of repair of mitral valve    Hypertensive kidney disease    Hypothyroid    Past Surgical History:  Procedure Laterality Date   ABDOMINAL HYSTERECTOMY  1974   Dr. Etta Grandchild, [facility] [place]   MITRAL VALVE REPAIR  2018   Dr. Lunette Stands, [place]   Social History   Socioeconomic History   Marital status: Married    Spouse name: John   Number of children: 2   Years of education: Not on file   Highest education level: Associate degree: occupational, Hotel manager, or vocational program  Occupational History   Not on file  Tobacco Use   Smoking status: Never   Smokeless tobacco: Never  Vaping Use   Vaping Use: Never used  Substance and Sexual Activity   Alcohol use: Yes    Comment: 1-2 drinks a month    Drug use: Never   Sexual activity: Not on file  Other Topics Concern   Not on file  Social History Narrative   Social History      Diet? No       Do you drink/eat things with caffeine? 1/2 cup coffee, tea       Marital status?     Married                               What year were you married? 1965      Do you live in a house, apartment, assisted living, condo, trailer, etc.? Apartment       Is it one or more stories? No       How many persons live in your home? 2       Do  you have any pets in your home? No       Highest level of education completed? 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         occasionaly                             Type & how often? Walking elliptical before covid       Advanced Directives      Do you have a living will? yes      Do you have a DNR form?                                  If not, do you want to discuss one? No       Do you have signed POA/HPOA for forms? yes      Functional Status      Do you have difficulty bathing or dressing yourself? No       Do you have difficulty preparing food or eating? No       Do you have difficulty managing your medications? No       Do you have difficulty managing your finances? No       Do you have difficulty affording your medications? No       Social Determinants of Radio broadcast assistant Strain: Not  on file  Food Insecurity: Not on file  Transportation Needs: Not on file  Physical Activity: Not on file  Stress: Not on file  Social Connections: Not on file   Allergies  Allergen Reactions   Penicillin G Benzathine & Proc Other (See Comments)    Unknown.   Family History  Problem Relation Age of Onset   Parkinson's disease Mother    Dementia Mother    Prostate cancer Father    Stroke Sister    High Cholesterol Sister    Anxiety disorder Brother    Depression Brother    Panic disorder Brother    Dementia Maternal Grandfather    Parkinson's disease Maternal Grandfather     Current Outpatient Medications (Endocrine & Metabolic):    levothyroxine (SYNTHROID) 25 MCG tablet, PLEASE SEE ATTACHED FOR DETAILED DIRECTIONS    Current Outpatient Medications (Analgesics):    aspirin 81 MG chewable tablet, Chew by mouth daily.   Current Outpatient Medications (Other):    Ascorbic Acid (VITAMIN C) 100 MG tablet, Take 100 mg by mouth daily.   b complex vitamins tablet, Take 1 tablet by mouth daily.   calcium-vitamin D (OSCAL WITH D) 250-125 MG-UNIT tablet, Take 1 tablet by mouth daily. Dose unknown-Patient to find out.   cholecalciferol (VITAMIN D3) 25 MCG (1000 UT) tablet, Take 1,000 Units by mouth daily.   clindamycin (CLEOCIN) 300 MG capsule, Take 300 mg by mouth. Prior to dental work   gabapentin (NEURONTIN) 100 MG capsule, TAKE TWO CAPSULES BY MOUTH DAILY AT BEDTIME   LORazepam (ATIVAN) 0.5 MG tablet, TAKE 1/2 TO 1 TABLET BY MOUTH AT BEDTIME AS NEEDED FOR ANXIETY   MAGNESIUM GLYCINATE PO, Take 2 capsules by mouth daily.    sertraline (ZOLOFT) 50 MG tablet, TAKE 1/2 TABLET BY MOUTH EVERY DAY   sertraline (ZOLOFT) 50 MG tablet, Take 50 mg by mouth  daily. Take 1/2 a tablet daily.   Reviewed prior external information including notes and imaging from  primary care provider As well as notes that were available from care everywhere and other healthcare systems.  Past medical  history, social, surgical and family history all reviewed in electronic medical record.  No pertanent information unless stated regarding to the chief complaint.   Review of Systems:  No , visual changes, nausea, vomiting, diarrhea, constipation, dizziness, abdominal pain, skin rash, fevers, chills, night sweats, weight loss, swollen lymph nodes, body aches, joint swelling, chest pain, shortness of breath, mood changes. POSITIVE muscle aches headache  Objective  Blood pressure 124/82, pulse 64, height '5\' 3"'$  (1.6 m), weight 141 lb (64 kg), SpO2 97 %.   General: No apparent distress alert and oriented x3 mood and affect normal, dressed appropriately.  HEENT: Pupils equal, extraocular movements intact  Respiratory: Patient's speak in full sentences and does not appear short of breath  Cardiovascular: No lower extremity edema, non tender, no erythema  Patient's neck does have significant loss of lordosis.  Patient does have tenderness to palpation over the paraspinal musculature of the neck.  Patient lacks last 10 degrees of extension.  More tightness on the right side of the neck with rotation and sidebending.  Neurovascular intact distally with 5 out of 5 strength of the upper extremities.  Scapular dyskinesis still noted.    Impression and Recommendations:    The above documentation has been reviewed and is accurate and complete Lyndal Pulley, DO

## 2022-04-25 ENCOUNTER — Ambulatory Visit: Payer: Medicare HMO | Admitting: Family Medicine

## 2022-04-25 ENCOUNTER — Telehealth: Payer: Self-pay | Admitting: Family

## 2022-04-25 DIAGNOSIS — M5030 Other cervical disc degeneration, unspecified cervical region: Secondary | ICD-10-CM

## 2022-04-25 DIAGNOSIS — F3341 Major depressive disorder, recurrent, in partial remission: Secondary | ICD-10-CM

## 2022-04-25 NOTE — Telephone Encounter (Signed)
Patient has request refill on Zoloft '50mg'$ . Patient medication not refilled by PCP Mast, Man X, NP yet. Medication pend and sent to PCP for approval.

## 2022-04-25 NOTE — Patient Instructions (Addendum)
Do prescribed exercises at least 3x a week Keeps hands in peripheral vision 2 Tennis balls and a tube sock. Lay where head meets neck

## 2022-04-25 NOTE — Assessment & Plan Note (Signed)
Known arthritic changes with her chronic problem that seems to be worsening.  Once again discussed different medications but is not taking the gabapentin on a regular basis.  Patient tries to limit the amount of medications.  Given home exercises to work on strengthening of the upper back.  We discussed with patient knowing that she does have some arthritic changes so we could consider advanced imaging and epidurals.  Patient would like to avoid that and see how she does with conservative therapy.  Follow-up with me again in 2 to 3 months.

## 2022-04-26 ENCOUNTER — Telehealth: Payer: Self-pay | Admitting: *Deleted

## 2022-04-26 ENCOUNTER — Other Ambulatory Visit: Payer: Medicare HMO

## 2022-04-26 DIAGNOSIS — E871 Hypo-osmolality and hyponatremia: Secondary | ICD-10-CM

## 2022-04-26 NOTE — Telephone Encounter (Signed)
Patient called and stated that she had an appointment this morning to have Labs drawn at [facility] and they did NOT have any lab orders. Was there early this morning.   Requesting call from [facility] or her CMA to explain why no orders. Please Call [phone]  (Forwarded message to [facility] [place])

## 2022-04-26 NOTE — Telephone Encounter (Signed)
Called and spoke with patient apologized for the disconnect . She stated that she had another appointment so she did not stay to get the blood work drawn.We rescheduled her to get labs this upcoming Tuesday morning. She stated that would be fine with her. Nothing else follows at this time.

## 2022-04-27 DIAGNOSIS — Z23 Encounter for immunization: Secondary | ICD-10-CM | POA: Diagnosis not present

## 2022-05-01 ENCOUNTER — Other Ambulatory Visit: Payer: Medicare HMO

## 2022-05-01 DIAGNOSIS — E871 Hypo-osmolality and hyponatremia: Secondary | ICD-10-CM | POA: Diagnosis not present

## 2022-05-01 LAB — BASIC METABOLIC PANEL WITH GFR
BUN: 13 mg/dL (ref 7–25)
CO2: 27 mmol/L (ref 20–32)
Calcium: 9 mg/dL (ref 8.6–10.4)
Chloride: 105 mmol/L (ref 98–110)
Creat: 0.97 mg/dL (ref 0.60–1.00)
Glucose, Bld: 93 mg/dL (ref 65–99)
Potassium: 4.3 mmol/L (ref 3.5–5.3)
Sodium: 140 mmol/L (ref 135–146)
eGFR: 60 mL/min/{1.73_m2} (ref >=60)

## 2022-05-22 ENCOUNTER — Other Ambulatory Visit: Payer: Self-pay

## 2022-05-22 DIAGNOSIS — M5416 Radiculopathy, lumbar region: Secondary | ICD-10-CM

## 2022-05-22 NOTE — Telephone Encounter (Signed)
Epidural ordered.  

## 2022-05-22 NOTE — Telephone Encounter (Signed)
Pt would like repeat epidural ordered,  last done in June.

## 2022-05-23 ENCOUNTER — Telehealth: Payer: Self-pay

## 2022-05-23 NOTE — Telephone Encounter (Signed)
Patient called requesting prescription for gabapentin. She would like hard copy of prescription so that she can see who offers the best price.Could you please print prescription out and give to patient.  Message routed to Man X Mast, NP

## 2022-05-25 ENCOUNTER — Other Ambulatory Visit: Payer: Self-pay | Admitting: Family Medicine

## 2022-05-31 ENCOUNTER — Ambulatory Visit
Admission: RE | Admit: 2022-05-31 | Discharge: 2022-05-31 | Disposition: A | Discharge status: Home / Self Care | Payer: Medicare HMO | Source: Ambulatory Visit | Attending: Family Medicine | Admitting: Family Medicine

## 2022-05-31 ENCOUNTER — Other Ambulatory Visit: Payer: Medicare HMO

## 2022-05-31 DIAGNOSIS — M5416 Radiculopathy, lumbar region: Secondary | ICD-10-CM

## 2022-05-31 DIAGNOSIS — M4727 Other spondylosis with radiculopathy, lumbosacral region: Secondary | ICD-10-CM | POA: Diagnosis not present

## 2022-05-31 MED ORDER — IOPAMIDOL (ISOVUE-M 200) INJECTION 41%
1.0000 mL | Freq: Once | INTRAMUSCULAR | Status: AC
  Administered 2022-05-31: 1 mL via EPIDURAL

## 2022-05-31 MED ORDER — METHYLPREDNISOLONE ACETATE 40 MG/ML INJ SUSP (RADIOLOG
80.0000 mg | Freq: Once | INTRAMUSCULAR | Status: AC
  Administered 2022-05-31: 80 mg via EPIDURAL

## 2022-05-31 NOTE — Discharge Instructions (Signed)

## 2022-06-13 DIAGNOSIS — R519 Headache, unspecified: Secondary | ICD-10-CM | POA: Diagnosis not present

## 2022-06-13 DIAGNOSIS — R030 Elevated blood-pressure reading, without diagnosis of hypertension: Secondary | ICD-10-CM | POA: Diagnosis not present

## 2022-06-13 DIAGNOSIS — E039 Hypothyroidism, unspecified: Secondary | ICD-10-CM | POA: Diagnosis not present

## 2022-06-13 DIAGNOSIS — R0600 Dyspnea, unspecified: Secondary | ICD-10-CM | POA: Diagnosis not present

## 2022-06-13 DIAGNOSIS — Z9889 Other specified postprocedural states: Secondary | ICD-10-CM | POA: Diagnosis not present

## 2022-06-13 DIAGNOSIS — I491 Atrial premature depolarization: Secondary | ICD-10-CM | POA: Diagnosis not present

## 2022-06-13 DIAGNOSIS — I517 Cardiomegaly: Secondary | ICD-10-CM | POA: Diagnosis not present

## 2022-07-04 ENCOUNTER — Ambulatory Visit: Payer: Medicare HMO | Admitting: Family Medicine

## 2022-07-12 ENCOUNTER — Non-Acute Institutional Stay: Payer: Medicare HMO | Admitting: Nurse Practitioner

## 2022-07-12 ENCOUNTER — Encounter: Payer: Self-pay | Admitting: Nurse Practitioner

## 2022-07-12 VITALS — BP 110/72 | HR 72 | Temp 97.3°F | Resp 18 | Ht 63.0 in | Wt 139.0 lb

## 2022-07-12 DIAGNOSIS — G43109 Migraine with aura, not intractable, without status migrainosus: Secondary | ICD-10-CM | POA: Diagnosis not present

## 2022-07-12 DIAGNOSIS — K219 Gastro-esophageal reflux disease without esophagitis: Secondary | ICD-10-CM | POA: Diagnosis not present

## 2022-07-12 DIAGNOSIS — G3184 Mild cognitive impairment, so stated: Secondary | ICD-10-CM | POA: Diagnosis not present

## 2022-07-12 DIAGNOSIS — M5136 Other intervertebral disc degeneration, lumbar region: Secondary | ICD-10-CM

## 2022-07-12 DIAGNOSIS — E785 Hyperlipidemia, unspecified: Secondary | ICD-10-CM | POA: Diagnosis not present

## 2022-07-12 DIAGNOSIS — E039 Hypothyroidism, unspecified: Secondary | ICD-10-CM

## 2022-07-12 DIAGNOSIS — Z8673 Personal history of transient ischemic attack (TIA), and cerebral infarction without residual deficits: Secondary | ICD-10-CM | POA: Insufficient documentation

## 2022-07-12 DIAGNOSIS — M8580 Other specified disorders of bone density and structure, unspecified site: Secondary | ICD-10-CM

## 2022-07-12 DIAGNOSIS — M51369 Other intervertebral disc degeneration, lumbar region without mention of lumbar back pain or lower extremity pain: Secondary | ICD-10-CM

## 2022-07-12 DIAGNOSIS — R69 Illness, unspecified: Secondary | ICD-10-CM | POA: Diagnosis not present

## 2022-07-12 DIAGNOSIS — R001 Bradycardia, unspecified: Secondary | ICD-10-CM | POA: Diagnosis not present

## 2022-07-12 DIAGNOSIS — N1831 Chronic kidney disease, stage 3a: Secondary | ICD-10-CM | POA: Diagnosis not present

## 2022-07-12 DIAGNOSIS — F339 Major depressive disorder, recurrent, unspecified: Secondary | ICD-10-CM

## 2022-07-12 MED ORDER — GABAPENTIN 100 MG PO CAPS: 100.0000 mg | ORAL_CAPSULE | Freq: Three times a day (TID) | ORAL | 3 refills | Status: DC

## 2022-07-12 MED ORDER — MEMANTINE HCL 5 MG PO TABS: 5.0000 mg | ORAL_TABLET | Freq: Every day | ORAL | 0 refills | Status: DC

## 2022-07-12 MED ORDER — MEMANTINE HCL 5 MG PO TABS: 5.0000 mg | ORAL_TABLET | Freq: Two times a day (BID) | ORAL | 0 refills | Status: DC

## 2022-07-12 NOTE — Assessment & Plan Note (Signed)
lumbar radiculopathy,  f/u Ortho, s/p inj 05/31/22

## 2022-07-12 NOTE — Assessment & Plan Note (Signed)
stable, off Esomeprazole, Hgb 14.0 03/07/22

## 2022-07-12 NOTE — Assessment & Plan Note (Addendum)
takes  Levothyroxine, TSH 1.58 10/30/21, update TSH, CBC/diff, CMP/eGFR, Vit B12, lipids

## 2022-07-12 NOTE — Assessment & Plan Note (Signed)
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

## 2022-07-12 NOTE — Assessment & Plan Note (Signed)
Bun/creat 19/0.89 03/07/22

## 2022-07-12 NOTE — Assessment & Plan Note (Signed)
Continue prn Lorazepam, Sertraline. Denied depression.

## 2022-07-12 NOTE — Assessment & Plan Note (Signed)
no recent fx. on Ca Vit D

## 2022-07-12 NOTE — Assessment & Plan Note (Addendum)
saw Neurology 02/07/22, functioning well in [place], wants to start Namenda '5mg'$  qd

## 2022-07-12 NOTE — Progress Notes (Signed)
Location:   Clinic [facility]   Place of Service:  Clinic (12) Provider: Marlana Latus NP  Code Status: DNR Goals of Care:     03/01/2022    8:05 AM  Advanced Directives  Does Patient Have a Medical Advance Directive? Yes  Does patient want to make changes to medical advance directive? No - Patient declined     Chief Complaint  Patient presents with   Medical Management of Chronic Issues    Patient is here for a follow up for chronic conditions     HPI: Patient is a 81 y.o. female seen today for medical management of chronic diseases.    Hx of ocular migraine, top of head and neck discomfort at times, not new. Mother had Parkinson/dementia. 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 Denied depression.              CKD Bun/creat 19/0.89 03/07/22             Hyponatremia, Na 131 03/07/22             S/p Mitral valve repair             Chronic lower back pain, lumbar radiculopathy,  f/u Ortho, scheduled Gabapentin 174m tid.              Hyperlipidemia, LDL 95 10/30/21 takes Pravastatin, ASA             Osteopenia, no recent fx. on Ca Vit D             GERD ,stable, off Esomeprazole, Hgb 14.0 03/07/22             Bradycardia, DOE, HR in 50, followed by Cardiology, echo 07/25/21  Past Medical History:  Diagnosis Date   Anxiety and depression    DDD (degenerative disc disease), lumbar    History of repair of mitral valve    Hypertensive kidney disease    Hypothyroid     Past Surgical History:  Procedure Laterality Date   [facility]   MITRAL VAL[place]  [place]    Allergies  Allergen  Reactions   Penicillin G Benzathine & Proc Other (See Comments)    Unknown.    Allergies as of 07/12/2022       Reactions   Penicillin G Benzathine & Proc Other (See Comments)   Unknown.        Medication List        Accurate as of July 12, 2022  1:43 PM. If you have any questions, ask your nurse or doctor.          aspirin 81 MG chewable tablet Chew by mouth daily.   b complex vitamins tablet Take 1 tablet by mouth daily.   calcium-vitamin D 250-125 MG-UNIT tablet Commonly known as: OSCAL WITH D Take 1 tablet by mouth daily. Dose unknown-Patient to find out.   cholecalciferol 25 MCG (1000 UNIT) tablet Commonly known as: VITAMIN D3 Take 1,000 Units by mouth daily.   clindamycin 300  MG capsule Commonly known as: CLEOCIN Take 300 mg by mouth. Prior to dental work   gabapentin 100 MG capsule Commonly known as: NEURONTIN Take 1 capsule (100 mg total) by mouth 3 (three) times daily. What changed: See the new instructions. Changed by: Emylia Latella X Shaneal Barasch, NP   levothyroxine 25 MCG tablet Commonly known as: SYNTHROID PLEASE SEE ATTACHED FOR DETAILED DIRECTIONS   LORazepam 0.5 MG tablet Commonly known as: ATIVAN TAKE 1/2 TO 1 TABLET BY MOUTH AT BEDTIME AS NEEDED FOR ANXIETY   MAGNESIUM GLYCINATE PO Take 2 capsules by mouth daily.   sertraline 50 MG tablet Commonly known as: ZOLOFT Take 50 mg by mouth daily. Take 1/2 a tablet daily.   sertraline 50 MG tablet Commonly known as: ZOLOFT TAKE 1/2 TABLET BY MOUTH EVERY DAY   vitamin C 100 MG tablet Take 100 mg by mouth daily.        Review of Systems:  Review of Systems  Constitutional:  Negative for fatigue and fever.  HENT:  Negative for congestion and voice change.   Eyes:  Negative for visual disturbance.       Dry eyes  Respiratory:  Positive for shortness of breath. Negative for cough and wheezing.        DOE  Cardiovascular:  Negative for leg swelling.  Gastrointestinal:  Negative for abdominal  pain and constipation.       Hemorrhoidal external from previous exam  Genitourinary:  Negative for dysuria and frequency.  Musculoskeletal:  Positive for arthralgias and back pain. Negative for gait problem.  Skin:  Negative for color change.       Hair thinning on top of head.   Neurological:  Positive for headaches. Negative for tremors, seizures, facial asymmetry, speech difficulty, weakness and light-headedness.       Memory lapses. Top of head, neck discomfort not new  Psychiatric/Behavioral:  Positive for sleep disturbance. Negative for confusion. The patient is not nervous/anxious.        Occasional     Health Maintenance  Topic Date Due   Zoster Vaccines- Shingrix (1 of 2) Never done   DEXA SCAN  Never done   DTaP/Tdap/Td (2 - Td or Tdap) 08/15/2021   COVID-19 Vaccine (5 - 2023-24 season) 07/12/2022   Medicare Annual Wellness (AWV)  08/24/2022   Pneumonia Vaccine 16+ Years old  Completed   INFLUENZA VACCINE  Completed   Hepatitis C Screening  Completed   HPV VACCINES  Aged Out    Physical Exam: Vitals:   07/12/22 1313  BP: 110/72  Pulse: 72  Resp: 18  Temp: (!) 97.3 F (36.3 C)  SpO2: 98%  Weight: 139 lb (63 kg)  Height: _0  (1.6 m)   Body mass index is 24.62 kg/m. Physical Exam Vitals and nursing note reviewed.  Constitutional:      Appearance: Normal appearance.  HENT:     Head: Normocephalic and atraumatic.     Nose: Nose normal.     Mouth/Throat:     Mouth: Mucous membranes are moist.  Eyes:     Conjunctiva/sclera: Conjunctivae normal.     Pupils: Pupils are equal, round, and reactive to light.     Comments: dry eyes  Cardiovascular:     Rate and Rhythm: Regular rhythm. Bradycardia present.     Heart sounds: No murmur heard.    Comments: HR 50s Pulmonary:     Effort: Pulmonary effort is normal.     Breath sounds: No rales.  Abdominal:  General: Bowel sounds are normal.     Palpations: Abdomen is soft.     Tenderness: There is no  abdominal tenderness.  Genitourinary:    Comments: Hemorrhoids external. Breast exam  Musculoskeletal:     Cervical back: Normal range of motion and neck supple.     Right lower leg: No edema.     Left lower leg: No edema.  Skin:    General: Skin is warm and dry.  Neurological:     General: No focal deficit present.     Mental Status: She is alert and oriented to person, place, and time. Mental status is at baseline.     Gait: Gait normal.  Psychiatric:        Mood and Affect: Mood normal.        Behavior: Behavior normal.        Thought Content: Thought content normal.        Judgment: Judgment normal.     Labs reviewed: Basic Metabolic Panel: Recent Labs    10/30/21 1540 03/07/22 1432 05/01/22 0737  NA 140 131* 140  K 4.3 4.4 4.3  CL 104 98 105  CO2 _0 GLUCOSE 89 78 93  BUN _1 CREATININE 1.07* 0.89 0.97  CALCIUM 9.3 9.0 9.0  TSH 1.58  --   --    Liver Function Tests: Recent Labs    10/30/21 1540 03/07/22 1432  AST 19 21  ALT 13 16  ALKPHOS  --  60  BILITOT 0.4 0.4  PROT 6.5 6.8  ALBUMIN  --  4.3   No results for input(s): "LIPASE", "AMYLASE" in the last 8760 hours. No results for input(s): "AMMONIA" in the last 8760 hours. CBC: Recent Labs    10/30/21 1540 03/07/22 1432  WBC 4.2 6.7  NEUTROABS 1,873 3.8  HGB 15.0 14.0  HCT 44.6 41.3  MCV 95.1 95.4  PLT 274 270.0   Lipid Panel: Recent Labs    10/30/21 1540  CHOL 182  HDL 65  LDLCALC 95  TRIG 122  CHOLHDL 2.8   No results found for: "HGBA1C"  Procedures since last visit: No results found.  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 Mother had Parkinson/dementia.  Hypothyroidism takes  Levothyroxine, TSH 1.58 10/30/21, update TSH, CBC/diff, CMP/eGFR, Vit B12, lipids  Recurrent depression (HCC) Continue prn Lorazepam, Sertraline. Denied depression.   CKD (chronic kidney disease) stage 3, GFR 30-59 ml/min (HCC) Bun/creat 19/0.89 03/07/22  Bradycardia  S/p Mitral valve repair,  DOE, HR in 50, followed by Cardiology, echo 07/25/21  Lumbar degenerative disc disease  lumbar radiculopathy,  f/u Ortho, s/p inj 05/31/22  Hyperlipidemia  LDL 95 10/30/21 takes Pravastatin, ASA  Osteopenia no recent fx. on Ca Vit D  GERD (gastroesophageal reflux disease) stable, off Esomeprazole, Hgb 14.0 03/07/22   Labs/tests ordered: TSH, CBC/diff, CMP/eGFR, Vit B12, lipids prior to the next app  Next appt:  3 months.

## 2022-07-12 NOTE — Assessment & Plan Note (Signed)
S/p Mitral valve repair,  DOE, HR in 50, followed by Cardiology, echo 07/25/21

## 2022-07-12 NOTE — Assessment & Plan Note (Signed)
top of head and neck discomfort at times, not new. Mother had Parkinson/dementia.

## 2022-07-12 NOTE — Assessment & Plan Note (Signed)
LDL 95 10/30/21 takes Pravastatin, ASA

## 2022-07-16 HISTORY — PX: TRANSTHORACIC ECHOCARDIOGRAM: SHX275

## 2022-08-01 ENCOUNTER — Other Ambulatory Visit: Payer: Self-pay

## 2022-08-01 DIAGNOSIS — E538 Deficiency of other specified B group vitamins: Secondary | ICD-10-CM

## 2022-08-01 DIAGNOSIS — E785 Hyperlipidemia, unspecified: Secondary | ICD-10-CM

## 2022-08-01 DIAGNOSIS — N1831 Chronic kidney disease, stage 3a: Secondary | ICD-10-CM

## 2022-08-01 DIAGNOSIS — E039 Hypothyroidism, unspecified: Secondary | ICD-10-CM

## 2022-08-06 NOTE — Telephone Encounter (Signed)
Message routed to [facility]

## 2022-08-13 DIAGNOSIS — Z95818 Presence of other cardiac implants and grafts: Secondary | ICD-10-CM | POA: Diagnosis not present

## 2022-08-13 DIAGNOSIS — I342 Nonrheumatic mitral (valve) stenosis: Secondary | ICD-10-CM | POA: Diagnosis not present

## 2022-08-30 DIAGNOSIS — H04123 Dry eye syndrome of bilateral lacrimal glands: Secondary | ICD-10-CM | POA: Diagnosis not present

## 2022-08-30 DIAGNOSIS — H40003 Preglaucoma, unspecified, bilateral: Secondary | ICD-10-CM | POA: Diagnosis not present

## 2022-09-20 ENCOUNTER — Other Ambulatory Visit: Payer: Self-pay | Admitting: Nurse Practitioner

## 2022-09-20 DIAGNOSIS — F3341 Major depressive disorder, recurrent, in partial remission: Secondary | ICD-10-CM

## 2022-10-09 ENCOUNTER — Other Ambulatory Visit: Payer: Self-pay | Admitting: Nurse Practitioner

## 2022-10-16 ENCOUNTER — Other Ambulatory Visit: Payer: Medicare HMO

## 2022-10-16 DIAGNOSIS — E039 Hypothyroidism, unspecified: Secondary | ICD-10-CM | POA: Diagnosis not present

## 2022-10-16 DIAGNOSIS — I129 Hypertensive chronic kidney disease with stage 1 through stage 4 chronic kidney disease, or unspecified chronic kidney disease: Secondary | ICD-10-CM | POA: Diagnosis not present

## 2022-10-16 DIAGNOSIS — E538 Deficiency of other specified B group vitamins: Secondary | ICD-10-CM | POA: Diagnosis not present

## 2022-10-16 DIAGNOSIS — N1831 Chronic kidney disease, stage 3a: Secondary | ICD-10-CM | POA: Diagnosis not present

## 2022-10-16 DIAGNOSIS — E785 Hyperlipidemia, unspecified: Secondary | ICD-10-CM | POA: Diagnosis not present

## 2022-10-17 LAB — LIPID PANEL
Cholesterol: 172 mg/dL (ref <200)
HDL: 56 mg/dL (ref >=50)
LDL Cholesterol (Calc): 89 mg/dL (calc)
Non-HDL Cholesterol (Calc): 116 mg/dL (calc) (ref <130)
Total CHOL/HDL Ratio: 3.1 (calc) (ref <5.0)
Triglycerides: 171 mg/dL — ABNORMAL HIGH (ref <150)

## 2022-10-17 LAB — COMPLETE METABOLIC PANEL WITH GFR
AG Ratio: 1.8 (calc) (ref 1.0–2.5)
ALT: 13 U/L (ref 6–29)
AST: 22 U/L (ref 10–35)
Albumin: 4.2 g/dL (ref 3.6–5.1)
Alkaline phosphatase (APISO): 68 U/L (ref 37–153)
BUN: 16 mg/dL (ref 7–25)
CO2: 26 mmol/L (ref 20–32)
Calcium: 9.3 mg/dL (ref 8.6–10.4)
Chloride: 105 mmol/L (ref 98–110)
Creat: 0.86 mg/dL (ref 0.60–1.00)
Globulin: 2.4 g/dL (calc) (ref 1.9–3.7)
Glucose, Bld: 92 mg/dL (ref 65–99)
Potassium: 4.4 mmol/L (ref 3.5–5.3)
Sodium: 139 mmol/L (ref 135–146)
Total Bilirubin: 0.6 mg/dL (ref 0.2–1.2)
Total Protein: 6.6 g/dL (ref 6.1–8.1)
eGFR: 69 mL/min/{1.73_m2} (ref >=60)

## 2022-10-17 LAB — CBC WITH DIFFERENTIAL/PLATELET
Absolute Monocytes: 409 cells/uL (ref 200–950)
Basophils Absolute: 32 cells/uL (ref 0–200)
Basophils Relative: 0.7 %
Eosinophils Absolute: 78 cells/uL (ref 15–500)
Eosinophils Relative: 1.7 %
HCT: 40.7 % (ref 35.0–45.0)
Hemoglobin: 13.7 g/dL (ref 11.7–15.5)
Lymphs Abs: 1913.6 cells/uL (ref 850–3900)
MCH: 32.1 pg (ref 27.0–33.0)
MCHC: 33.7 g/dL (ref 32.0–36.0)
MCV: 95.3 fL (ref 80.0–100.0)
MPV: 10.4 fL (ref 7.5–12.5)
Monocytes Relative: 8.9 %
Neutro Abs: 2167 cells/uL (ref 1500–7800)
Neutrophils Relative %: 47.1 %
Platelets: 275 10*3/uL (ref 140–400)
RBC: 4.27 10*6/uL (ref 3.80–5.10)
RDW: 11.8 % (ref 11.0–15.0)
Total Lymphocyte: 41.6 %
WBC: 4.6 10*3/uL (ref 3.8–10.8)

## 2022-10-17 LAB — TSH: TSH: 3.61 mIU/L (ref 0.40–4.50)

## 2022-10-17 LAB — HEMOGLOBIN A1C
Hgb A1c MFr Bld: 5.8 % of total Hgb — ABNORMAL HIGH (ref <5.7)
Mean Plasma Glucose: 120 mg/dL
eAG (mmol/L): 6.6 mmol/L

## 2022-10-18 ENCOUNTER — Encounter: Payer: Self-pay | Admitting: Nurse Practitioner

## 2022-10-18 ENCOUNTER — Non-Acute Institutional Stay: Payer: Medicare HMO | Admitting: Nurse Practitioner

## 2022-10-18 VITALS — BP 120/80 | HR 60 | Temp 97.0°F | Resp 18 | Ht 63.0 in | Wt 140.1 lb

## 2022-10-18 DIAGNOSIS — E785 Hyperlipidemia, unspecified: Secondary | ICD-10-CM | POA: Diagnosis not present

## 2022-10-18 DIAGNOSIS — E039 Hypothyroidism, unspecified: Secondary | ICD-10-CM

## 2022-10-18 DIAGNOSIS — R001 Bradycardia, unspecified: Secondary | ICD-10-CM | POA: Diagnosis not present

## 2022-10-18 DIAGNOSIS — S32040D Wedge compression fracture of fourth lumbar vertebra, subsequent encounter for fracture with routine healing: Secondary | ICD-10-CM | POA: Diagnosis not present

## 2022-10-18 DIAGNOSIS — R7303 Prediabetes: Secondary | ICD-10-CM | POA: Insufficient documentation

## 2022-10-18 DIAGNOSIS — F339 Major depressive disorder, recurrent, unspecified: Secondary | ICD-10-CM

## 2022-10-18 DIAGNOSIS — G3184 Mild cognitive impairment, so stated: Secondary | ICD-10-CM

## 2022-10-18 DIAGNOSIS — G43109 Migraine with aura, not intractable, without status migrainosus: Secondary | ICD-10-CM | POA: Diagnosis not present

## 2022-10-18 DIAGNOSIS — R69 Illness, unspecified: Secondary | ICD-10-CM | POA: Diagnosis not present

## 2022-10-18 NOTE — Assessment & Plan Note (Signed)
takes  Levothyroxine, TSH 3.61 10/16/22

## 2022-10-18 NOTE — Assessment & Plan Note (Addendum)
saw Neurology 02/07/22, 08/06/22 stopped taking Memantine, caused itching. Will refer to ST for cognitive therapy

## 2022-10-18 NOTE — Assessment & Plan Note (Signed)
DOE, HR in 50, followed by Cardiology, 08/13/22 Echo EF 60-65,  S/p Mitral valve repair

## 2022-10-18 NOTE — Assessment & Plan Note (Signed)
prn Lorazepam, Sertraline. Denied depression.  

## 2022-10-18 NOTE — Assessment & Plan Note (Signed)
Hgb A1c 5.8, continue life style modification, f/u Hgb A1c.

## 2022-10-18 NOTE — Assessment & Plan Note (Signed)
top of head and neck discomfort at times, not new. Mother had Parkinson/dementia. 

## 2022-10-18 NOTE — Assessment & Plan Note (Signed)
Chronic lower back pain, lumbar radiculopathy,  f/u Ortho, scheduled Gabapentin 100mg  tid.

## 2022-10-18 NOTE — Assessment & Plan Note (Signed)
LDL 95 89 10/16/22,  takes Pravastatin, ASA

## 2022-10-18 NOTE — Progress Notes (Signed)
Location:   Clinic [facility]   Place of Service:  Clinic (12) Provider: Marlana Latus NP  Code Status: DNR Goals of Care:     03/01/2022    8:05 AM  Advanced Directives  Does Patient Have a Medical Advance Directive? Yes  Does patient want to make changes to medical advance directive? No - Patient declined     Chief Complaint  Patient presents with   Follow-up    Three Month Follow up    HPI: Patient is a 82 y.o. female seen today for medical management of chronic diseases.        Hx of ocular migraine, top of head and neck discomfort at times, not new. Mother had Parkinson/dementia.  Glaucoma, under ophthalmology care.  CVA,  followed by neurology, 03/11/22 MRI mild chronic small vessel ischemic changes,  remote age lacunar infarct in the left periventricular region Mild memory loss, saw Neurology 02/07/22, 08/06/22 stopped taking Memantine, caused itching.              Hypothyroidism, takes  Levothyroxine, TSH 3.61 10/16/22             Depression/anxiety, prn Lorazepam, Sertraline. Denied depression.              CKD Bun/creat 19/0.89 03/07/22             Hyponatremia, Na 139 10/16/22             S/p Mitral valve repair             Chronic lower back pain, lumbar radiculopathy,  f/u Ortho, scheduled Gabapentin 100mg  tid.              Hyperlipidemia, LDL 95 89 10/16/22,  takes Pravastatin, ASA             Osteopenia, no recent fx. on Ca Vit D             GERD ,stable, off Esomeprazole, Hgb 13.7 10/16/22             Bradycardia, DOE, HR in 50, followed by Cardiology, 08/13/22 Echo EF 60-65  Prediabetes Hgb A1c 5.8, continue life style modification, f/u Hgb A1c.   Past Medical History:  Diagnosis Date   Anxiety and depression    DDD (degenerative disc disease), lumbar    History of repair of mitral valve    Hypertensive kidney disease    Hypothyroid     Past Surgical History:  Procedure Laterality Date   ABDOMINAL HYSTERECTOMY  1974   Dr. Etta Grandchild, [facility] [place]   MITRAL VALVE REPAIR   2018   Dr. Lunette Stands, [facility]    Allergies  Allergen Reactions   Penicillin G Benzathine & Proc Other (See Comments)    Unknown.    Allergies as of 10/18/2022       Reactions   Penicillin G Benzathine & Proc Other (See Comments)   Unknown.        Medication List        Accurate as of October 18, 2022  4:10 PM. If you have any questions, ask your nurse or doctor.          aspirin 81 MG chewable tablet Chew by mouth daily.   b complex vitamins tablet Take 1 tablet by mouth daily.   calcium-vitamin D 250-125 MG-UNIT tablet Commonly known as: OSCAL WITH D Take 1 tablet by mouth daily. Dose unknown-Patient to find out.  cholecalciferol 25 MCG (1000 UNIT) tablet Commonly known as: VITAMIN D3 Take 1,000 Units by mouth daily.   clindamycin 300 MG capsule Commonly known as: CLEOCIN Take 300 mg by mouth. Prior to dental work   gabapentin 100 MG capsule Commonly known as: NEURONTIN Take 1 capsule (100 mg total) by mouth 3 (three) times daily.   levothyroxine 25 MCG tablet Commonly known as: SYNTHROID PLEASE SEE ATTACHED FOR DETAILED DIRECTIONS   LORazepam 0.5 MG tablet Commonly known as: ATIVAN TAKE 1/2 TO 1 TABLET BY MOUTH AT BEDTIME AS NEEDED FOR ANXIETY   MAGNESIUM GLYCINATE PO Take 2 capsules by mouth daily.   sertraline 50 MG tablet Commonly known as: ZOLOFT Take 50 mg by mouth daily. Take 1/2 a tablet daily.   sertraline 50 MG tablet Commonly known as: ZOLOFT TAKE 1/2 TABLET BY MOUTH DAILY   vitamin C 100 MG tablet Take 100 mg by mouth daily.        Review of Systems:  Review of Systems  Constitutional:  Negative for fatigue and fever.  HENT:  Negative for congestion and voice change.   Eyes:  Negative for visual disturbance.       Dry eyes  Respiratory:  Positive for shortness of breath. Negative for cough and wheezing.        DOE  Cardiovascular:  Negative for leg swelling.   Gastrointestinal:  Negative for abdominal pain and constipation.       Hemorrhoidal external from previous exam  Genitourinary:  Negative for dysuria and frequency.  Musculoskeletal:  Positive for arthralgias and back pain. Negative for gait problem.  Skin:  Negative for color change.       Hair thinning on top of head.   Neurological:  Positive for headaches. Negative for tremors, seizures, facial asymmetry, speech difficulty, weakness and light-headedness.       Memory lapses. Top of head, neck discomfort not new  Psychiatric/Behavioral:  Positive for sleep disturbance. Negative for confusion. The patient is not nervous/anxious.        Occasional     Health Maintenance  Topic Date Due   Zoster Vaccines- Shingrix (1 of 2) Never done   DTaP/Tdap/Td (2 - Td or Tdap) 08/15/2021   COVID-19 Vaccine (7 - 2023-24 season) 07/12/2022   Medicare Annual Wellness (AWV)  08/24/2022   INFLUENZA VACCINE  02/14/2023   Pneumonia Vaccine 55+ Years old  Completed   DEXA SCAN  Completed   Hepatitis C Screening  Completed   HPV VACCINES  Aged Out    Physical Exam: Vitals:   10/18/22 1422  BP: 120/80  Pulse: 60  Resp: 18  Temp: (!) 97 F (36.1 C)  SpO2: 98%  Weight: 140 lb 2 oz (63.6 kg)  Height: 5\' 3"  (1.6 m)   Body mass index is 24.82 kg/m. Physical Exam Vitals and nursing note reviewed.  Constitutional:      Appearance: Normal appearance.  HENT:     Head: Normocephalic and atraumatic.     Nose: Nose normal.     Mouth/Throat:     Mouth: Mucous membranes are moist.  Eyes:     Conjunctiva/sclera: Conjunctivae normal.     Pupils: Pupils are equal, round, and reactive to light.     Comments: dry eyes  Cardiovascular:     Rate and Rhythm: Regular rhythm. Bradycardia present.     Heart sounds: No murmur heard.    Comments: HR 50s Pulmonary:     Effort: Pulmonary effort is normal.  Breath sounds: No rales.  Abdominal:     General: Bowel sounds are normal.     Palpations:  Abdomen is soft.     Tenderness: There is no abdominal tenderness.  Genitourinary:    Comments: Hemorrhoids external. Breast exam  Musculoskeletal:     Cervical back: Normal range of motion and neck supple.     Right lower leg: No edema.     Left lower leg: No edema.  Skin:    General: Skin is warm and dry.  Neurological:     General: No focal deficit present.     Mental Status: She is alert and oriented to person, place, and time. Mental status is at baseline.     Gait: Gait normal.  Psychiatric:        Mood and Affect: Mood normal.        Behavior: Behavior normal.        Thought Content: Thought content normal.        Judgment: Judgment normal.     Labs reviewed: Basic Metabolic Panel: Recent Labs    10/30/21 1540 03/07/22 1432 05/01/22 0737 10/16/22 0729  NA 140 131* 140 139  K 4.3 4.4 4.3 4.4  CL 104 98 105 105  CO2 24 25 27 26   GLUCOSE 89 78 93 92  BUN 17 19 13 16   CREATININE 1.07* 0.89 0.97 0.86  CALCIUM 9.3 9.0 9.0 9.3  TSH 1.58  --   --  3.61   Liver Function Tests: Recent Labs    10/30/21 1540 03/07/22 1432 10/16/22 0729  AST 19 21 22   ALT 13 16 13   ALKPHOS  --  60  --   BILITOT 0.4 0.4 0.6  PROT 6.5 6.8 6.6  ALBUMIN  --  4.3  --    No results for input(s): "LIPASE", "AMYLASE" in the last 8760 hours. No results for input(s): "AMMONIA" in the last 8760 hours. CBC: Recent Labs    10/30/21 1540 03/07/22 1432 10/16/22 0729  WBC 4.2 6.7 4.6  NEUTROABS 1,873 3.8 2,167  HGB 15.0 14.0 13.7  HCT 44.6 41.3 40.7  MCV 95.1 95.4 95.3  PLT 274 270.0 275   Lipid Panel: Recent Labs    10/30/21 1540 10/16/22 0729  CHOL 182 172  HDL 65 56  LDLCALC 95 89  TRIG 122 171*  CHOLHDL 2.8 3.1   Lab Results  Component Value Date   HGBA1C 5.8 (H) 10/16/2022    Procedures since last visit: No results found.  Assessment/Plan  Prediabetes Hgb A1c 5.8, continue life style modification, f/u Hgb A1c.   Bradycardia DOE, HR in 50, followed by  Cardiology, 08/13/22 Echo EF 60-65,  S/p Mitral valve repair  Hyperlipidemia  LDL 95 89 10/16/22,  takes Pravastatin, ASA  Compression fracture of L4 vertebra with routine healing  Chronic lower back pain, lumbar radiculopathy,  f/u Ortho, scheduled Gabapentin 100mg  tid.   Recurrent depression (HCC) prn Lorazepam, Sertraline. Denied depression.   Hypothyroidism takes  Levothyroxine, TSH 3.61 10/16/22  MCI (mild cognitive impairment) with memory loss  saw Neurology 02/07/22, 08/06/22 stopped taking Memantine, caused itching. Will refer to ST for cognitive therapy  Ocular migraine top of head and neck discomfort at times, not new. Mother had Parkinson/dementia.   Labs/tests ordered:  none  Next appt:  6 months, AWV 2 weeks.

## 2022-10-19 LAB — VITAMIN B1: Vitamin B1 (Thiamine): 49 nmol/L — ABNORMAL HIGH (ref 8–30)

## 2022-10-27 ENCOUNTER — Other Ambulatory Visit: Payer: Self-pay | Admitting: Nurse Practitioner

## 2022-10-29 NOTE — Telephone Encounter (Signed)
Spoke with patients spouse and asked that he leave a message for patient to call us to discuss dose of thyroid medication. RX states see attached for directions and the pharmacy will need specific instructions on how patient takes medication.  Awaiting return call

## 2022-10-30 NOTE — Telephone Encounter (Signed)
Spoke with patient and she stated that she take 1 1/2 tablet daily for a total of 37.5 mcg daily. Patient plans to discuss with [facility] at next appointment about a dose reduction.

## 2022-11-06 DIAGNOSIS — Z823 Family history of stroke: Secondary | ICD-10-CM | POA: Diagnosis not present

## 2022-11-06 DIAGNOSIS — F419 Anxiety disorder, unspecified: Secondary | ICD-10-CM | POA: Diagnosis not present

## 2022-11-06 DIAGNOSIS — G4700 Insomnia, unspecified: Secondary | ICD-10-CM | POA: Diagnosis not present

## 2022-11-06 DIAGNOSIS — E039 Hypothyroidism, unspecified: Secondary | ICD-10-CM | POA: Diagnosis not present

## 2022-11-06 DIAGNOSIS — Z8249 Family history of ischemic heart disease and other diseases of the circulatory system: Secondary | ICD-10-CM | POA: Diagnosis not present

## 2022-11-06 DIAGNOSIS — G629 Polyneuropathy, unspecified: Secondary | ICD-10-CM | POA: Diagnosis not present

## 2022-11-06 DIAGNOSIS — Z803 Family history of malignant neoplasm of breast: Secondary | ICD-10-CM | POA: Diagnosis not present

## 2022-11-08 ENCOUNTER — Encounter: Payer: Self-pay | Admitting: Nurse Practitioner

## 2022-11-08 ENCOUNTER — Non-Acute Institutional Stay (INDEPENDENT_AMBULATORY_CARE_PROVIDER_SITE_OTHER): Payer: Medicare HMO | Admitting: Nurse Practitioner

## 2022-11-08 VITALS — BP 130/86 | HR 64 | Temp 97.5°F | Ht 63.0 in | Wt 141.8 lb

## 2022-11-08 DIAGNOSIS — G43109 Migraine with aura, not intractable, without status migrainosus: Secondary | ICD-10-CM

## 2022-11-08 DIAGNOSIS — Z0000 Encounter for general adult medical examination without abnormal findings: Secondary | ICD-10-CM

## 2022-11-08 DIAGNOSIS — M8580 Other specified disorders of bone density and structure, unspecified site: Secondary | ICD-10-CM

## 2022-11-08 NOTE — Progress Notes (Signed)
Subjective:   Kristine [name] is a 82 y.o. female who presents for Medicare Annual (Subsequent) preventive examination in the clinic @ [facility] [address].  Cardiac Risk Factors include: advanced age (>12men, >33 women)     Objective:    Today's Vitals   11/08/22 1441  BP: 130/86  Pulse: 64  Temp: (!) 97.5 F (36.4 C)  SpO2: 99%  Weight: 141 lb 12.8 oz (64.3 kg)  Height: 5\' 3"  (1.6 m)   Body mass index is 25.12 kg/m.     11/08/2022    2:43 PM 03/01/2022    8:05 AM 08/24/2021    1:26 PM 08/03/2021   11:25 AM 07/31/2021   11:16 AM  Advanced Directives  Does Patient Have a Medical Advance Directive? Yes Yes  Yes Yes  Type of Advance Directive Out of facility DNR (pink MOST or yellow form)  Healthcare Power of [place];Living will;Out of facility DNR (pink MOST or yellow form) Out of facility DNR (pink MOST or yellow form) Healthcare Power of [place];Living will;Out of facility DNR (pink MOST or yellow form)  Does patient want to make changes to medical advance directive? No - Patient declined No - Patient declined  No - Patient declined No - Patient declined  Copy of Healthcare Power of Attorney in Chart?     No - copy requested  Would patient like information on creating a medical advance directive?     No - Patient declined  Pre-existing out of facility DNR order (yellow form or pink MOST form) Pink MOST form placed in chart (order not valid for inpatient use)  Pink MOST form placed in chart (order not valid for inpatient use) Pink MOST form placed in chart (order not valid for inpatient use)     Current Medications (verified) Outpatient Encounter Medications as of 11/08/2022  Medication Sig   Ascorbic Acid (VITAMIN C) 100 MG tablet Take 100 mg by mouth daily.   aspirin 81 MG chewable tablet Chew by mouth daily.   b complex vitamins tablet Take 1 tablet by mouth daily.   calcium-vitamin D (OSCAL WITH D) 250-125 MG-UNIT tablet Take 1 tablet by mouth daily. Dose  unknown-Patient to find out.   cholecalciferol (VITAMIN D3) 25 MCG (1000 UT) tablet Take 1,000 Units by mouth daily.   clindamycin (CLEOCIN) 300 MG capsule Take 300 mg by mouth. Prior to dental work   gabapentin (NEURONTIN) 100 MG capsule Take 1 capsule (100 mg total) by mouth 3 (three) times daily.   levothyroxine (SYNTHROID) 25 MCG tablet Take 1.5 tablets (37.5 mcg total) by mouth daily before breakfast.   LORazepam (ATIVAN) 0.5 MG tablet TAKE 1/2 TO 1 TABLET BY MOUTH AT BEDTIME AS NEEDED FOR ANXIETY   MAGNESIUM GLYCINATE PO Take 1 capsule by mouth daily.   sertraline (ZOLOFT) 50 MG tablet TAKE 1/2 TABLET BY MOUTH DAILY   [DISCONTINUED] sertraline (ZOLOFT) 50 MG tablet Take 50 mg by mouth daily. Take 1/2 a tablet daily.   No facility-administered encounter medications on file as of 11/08/2022.    Allergies (verified) Penicillin g benzathine & proc   History: Past Medical History:  Diagnosis Date   Anxiety and depression    DDD (degenerative disc disease), lumbar    History of repair of mitral valve    Hypertensive kidney disease    Hypothyroid    Past Surgical History:  Procedure Laterality Date   ABDOMINAL HYSTERECTOMY  1974   Dr. Sharion Balloon, [facility] [place]   MITRAL VALVE REPAIR  2018  Dr. Ty Hilts, [facility]   TONSILLECTOMY  [address] [place]   Family History  Problem Relation Age of Onset   Parkinson's disease Mother    Dementia Mother    Prostate cancer Father    Stroke Sister    High Cholesterol Sister    Anxiety disorder Brother    Depression Brother    Panic disorder Brother    Dementia Maternal Grandfather    Parkinson's disease Maternal Grandfather    Social History   Socioeconomic History   Marital status: Married    Spouse name: John   Number of children: 2   Years of education: Not on file   Highest education level: Associate degree: occupational, Scientist, product/process development, or vocational program  Occupational History   Not on file  Tobacco Use    Smoking status: Never   Smokeless tobacco: Never  Vaping Use   Vaping Use: Never used  Substance and Sexual Activity   Alcohol use: Yes    Comment: 1-2 drinks a month    Drug use: Never   Sexual activity: Not on file  Other Topics Concern   Not on file  Social History Narrative   Social History      Diet? No       Do you drink/eat things with caffeine? 1/2 cup coffee, tea       Marital status?     Married                               What year were you married? 1965      Do you live in a house, apartment, assisted living, condo, trailer, etc.? Apartment       Is it one or more stories? No       How many persons live in your home? 2       Do you have any pets in your home? No       Highest level of education completed? 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         occasionaly                             Type & how often? Walking elliptical before covid       Advanced Directives      Do you have a living will? yes      Do you have a DNR form?                                  If not, do you want to discuss one? No       Do you have signed POA/HPOA for forms? yes      Functional Status      Do you have difficulty bathing or dressing yourself? No       Do you have difficulty preparing food or eating? No       Do you have difficulty managing your medications? No       Do you have difficulty managing your finances? No       Do you have difficulty affording your medications? No       Social  Determinants of Health   Financial Resource Strain: Not on file  Food Insecurity: Not on file  Transportation Needs: Not on file  Physical Activity: Not on file  Stress: Not on file  Social Connections: Not on file    Tobacco Counseling Counseling given: Not Answered   Clinical Intake:  Pre-visit preparation completed: Yes  Pain : No/denies pain      BMI - recorded: 25.12 Nutritional Status: BMI 25 -29 Overweight Nutritional Risks: None Diabetes: No  How often do you need to have someone help you when you read instructions, pamphlets, or other written materials from your doctor or pharmacy?: 1 - Never What is the last grade level you completed in school?: College  Diabetic?no  Interpreter Needed?: No  Information entered by :: Porsha McClurkin,CMA   Activities of Daily Living    11/08/2022    2:45 PM  In your present state of health, do you have any difficulty performing the following activities:  Hearing? 0  Vision? 0  Difficulty concentrating or making decisions? 0  Walking or climbing stairs? 0  Dressing or bathing? 0  Doing errands, shopping? 0  Preparing Food and eating ? N  Using the Toilet? N  In the past six months, have you accidently leaked urine? N  Do you have problems with loss of bowel control? N  Managing your Medications? N  Managing your Finances? N  Housekeeping or managing your Housekeeping? N    Patient Care Team: Rawan Riendeau X, NP as PCP - General (Internal Medicine)  Indicate any recent Medical Services you may have received from other than [facility] providers in the past year (date may be approximate).     Assessment:   This is a routine wellness examination for Eun.  Hearing/Vision screen Hearing Screening - Comments:: No hearing concerns/ wear hearing aids Vision Screening - Comments:: Wear glasses/ no concerns  Dietary issues and exercise activities discussed: Current Exercise Habits: Home exercise routine, Type of exercise: walking;strength training/weights, Time (Minutes): 30, Frequency (Times/Week): 6, Weekly Exercise (Minutes/Week): 180, Intensity: Mild, Exercise limited by: None identified   Goals Addressed   None    Depression Screen    11/08/2022    2:45 PM 10/18/2022    2:20 PM 09/05/2021    9:35 AM 08/24/2021    1:29 PM 08/03/2021    3:09 PM  PHQ 2/9 Scores  PHQ - 2 Score 0  0 0 0 0    Fall Risk    11/08/2022    2:45 PM 10/18/2022    2:20 PM 09/05/2021    9:34 AM 08/24/2021    1:28 PM 08/03/2021    3:09 PM  Fall Risk   Falls in the past year? 0 0 0 0 0  Number falls in past yr: 0 0 0 0 0  Injury with Fall? 0 0 0 0 0  Risk for fall due to : History of fall(s) No Fall Risks History of fall(s) History of fall(s) No Fall Risks  Follow up Falls evaluation completed Falls evaluation completed Education provided;Falls prevention discussed;Falls evaluation completed Falls evaluation completed;Education provided;Falls prevention discussed Falls evaluation completed    FALL RISK PREVENTION PERTAINING TO THE HOME:  Any stairs in or around the home? Yes  If so, are there any without handrails? Yes  Home free of loose throw rugs in walkways, pet beds, electrical cords, etc? Yes  Adequate lighting in your home to reduce risk of falls? Yes   ASSISTIVE DEVICES UTILIZED TO PREVENT FALLS:  Life alert? No  Use of a cane, walker or w/c? No  Grab bars in the bathroom? Yes  Shower chair or bench in shower? Yes  Elevated toilet seat or a handicapped toilet? Yes   TIMED UP AND GO:  Was the test performed? No .    Gait steady and fast without use of assistive device  Cognitive Function:    11/08/2022    2:49 PM 02/07/2022   11:03 AM  MMSE - Mini Mental State Exam  Orientation to time 5 4  Orientation to Place 5 3  Registration 3 3  Attention/ Calculation 5 2  Recall 3 0  Language- name 2 objects 2 2  Language- repeat 1 0  Language- follow 3 step command 3 3  Language- read & follow direction 1 1  Write a sentence 1 1  Copy design 1 1  Total score 30 20        08/24/2021    1:33 PM  6CIT Screen  What Year? 0 points  What month? 0 points  What time? 0 points  Count back from 20 0 points  Months in reverse 0 points  Repeat phrase 4 points  Total Score 4 points    Immunizations Immunization History  Administered Date(s) Administered   Hepatitis A  05/24/2000   Hepatitis A, Ped/Adol-2 Dose 05/24/2000   IPV 05/24/2000   Influenza, High Dose Seasonal PF 04/15/2021   Influenza-Unspecified 05/07/2022   Moderna SARS-COV2 Booster Vaccination 05/24/2020, 11/14/2020   Moderna Sars-Covid-2 Vaccination 07/20/2019, 08/17/2019   Pfizer Covid-19 Vaccine Bivalent Booster 79yrs & up 04/04/2021, 05/17/2022   Pneumococcal Conjugate-13 10/21/2015   Pneumococcal Polysaccharide-23 07/12/2008   Tdap 08/16/2011   Zoster, Live 07/16/2009    TDAP due  Flu Vaccine status: Up to date  Pneumococcal vaccine status: Up to date  Covid-19 vaccine status: Information provided on how to obtain vaccines.   Qualifies for Shingles Vaccine? Yes   Zostavax completed Yes   Shingrix Completed?: No.    Education has been provided regarding the importance of this vaccine. Patient has been advised to call insurance company to determine out of pocket expense if they have not yet received this vaccine. Advised may also receive vaccine at local pharmacy or Health Dept. Verbalized acceptance and understanding.  Screening Tests Health Maintenance  Topic Date Due   Zoster Vaccines- Shingrix (1 of 2) Never done   DTaP/Tdap/Td (2 - Td or Tdap) 08/15/2021   COVID-19 Vaccine (7 - 2023-24 season) 07/12/2022   INFLUENZA VACCINE  02/14/2023   Medicare Annual Wellness (AWV)  11/08/2023   Pneumonia Vaccine 26+ Years old  Completed   DEXA SCAN  Completed   Hepatitis C Screening  Completed   HPV VACCINES  Aged Out    Health Maintenance  Health Maintenance Due  Topic Date Due   Zoster Vaccines- Shingrix (1 of 2) Never done   DTaP/Tdap/Td (2 - Td or Tdap) 08/15/2021   COVID-19 Vaccine (7 - 2023-24 season) 07/12/2022    Colorectal cancer screening: No longer required.   Mammogram status: No longer required due to aged out.  Bone Density status: Completed 09/22/22. Results reflect: Bone density results: OSTEOPENIA. Repeat every 2 years.  Lung Cancer Screening: (Low Dose  CT Chest recommended if Age 42-80 years, 30 pack-year currently smoking OR have quit w/in 15years.) does not qualify.     Additional Screening:  Hepatitis C Screening: does not qualify;   Vision Screening: Recommended annual ophthalmology exams for early detection of glaucoma and  other disorders of the eye. Is the patient up to date with their annual eye exam?  Yes  Who is the provider or what is the name of the office in which the patient attends annual eye exams? 08/30/22 Dr. Logan Bores If pt is not established with a provider, would they like to be referred to a provider to establish care? No .   Dental Screening: Recommended annual dental exams for proper oral hygiene  Community Resource Referral / Chronic Care Management: CRR required this visit?  No   CCM required this visit?  No      Plan:     I have personally reviewed and noted the following in the patient's chart:   Medical and social history Use of alcohol, tobacco or illicit drugs  Current medications and supplements including opioid prescriptions. Patient is not currently taking opioid prescriptions. Functional ability and status Nutritional status Physical activity Advanced directives List of other physicians Hospitalizations, surgeries, and ER visits in previous 12 months Vitals Screenings to include cognitive, depression, and falls Referrals and appointments  In addition, I have reviewed and discussed with patient certain preventive protocols, quality metrics, and best practice recommendations. A written personalized care plan for preventive services as well as general preventive health recommendations were provided to patient.     Atif Chapple X Simaya Lumadue, NP   11/09/2022

## 2022-11-08 NOTE — Assessment & Plan Note (Signed)
Last DEXA 09/21/20 t score -2.3, repeat DEXA 2 years.

## 2022-11-09 ENCOUNTER — Encounter: Payer: Self-pay | Admitting: Nurse Practitioner

## 2022-11-25 ENCOUNTER — Other Ambulatory Visit: Payer: Self-pay | Admitting: Nurse Practitioner

## 2022-11-27 DIAGNOSIS — L820 Inflamed seborrheic keratosis: Secondary | ICD-10-CM | POA: Diagnosis not present

## 2022-11-27 DIAGNOSIS — L853 Xerosis cutis: Secondary | ICD-10-CM | POA: Diagnosis not present

## 2022-11-27 DIAGNOSIS — L814 Other melanin hyperpigmentation: Secondary | ICD-10-CM | POA: Diagnosis not present

## 2022-11-27 DIAGNOSIS — L821 Other seborrheic keratosis: Secondary | ICD-10-CM | POA: Diagnosis not present

## 2022-11-27 DIAGNOSIS — D1801 Hemangioma of skin and subcutaneous tissue: Secondary | ICD-10-CM | POA: Diagnosis not present

## 2022-11-27 DIAGNOSIS — L719 Rosacea, unspecified: Secondary | ICD-10-CM | POA: Diagnosis not present

## 2022-11-27 DIAGNOSIS — L570 Actinic keratosis: Secondary | ICD-10-CM | POA: Diagnosis not present

## 2022-12-11 DIAGNOSIS — R41841 Cognitive communication deficit: Secondary | ICD-10-CM | POA: Diagnosis not present

## 2022-12-18 DIAGNOSIS — R41841 Cognitive communication deficit: Secondary | ICD-10-CM | POA: Diagnosis not present

## 2022-12-23 ENCOUNTER — Other Ambulatory Visit: Payer: Self-pay | Admitting: Nurse Practitioner

## 2022-12-31 DIAGNOSIS — R41841 Cognitive communication deficit: Secondary | ICD-10-CM | POA: Diagnosis not present

## 2023-02-25 DIAGNOSIS — E782 Mixed hyperlipidemia: Secondary | ICD-10-CM | POA: Diagnosis not present

## 2023-02-26 DIAGNOSIS — L820 Inflamed seborrheic keratosis: Secondary | ICD-10-CM | POA: Diagnosis not present

## 2023-02-26 DIAGNOSIS — L719 Rosacea, unspecified: Secondary | ICD-10-CM | POA: Diagnosis not present

## 2023-02-26 DIAGNOSIS — L570 Actinic keratosis: Secondary | ICD-10-CM | POA: Diagnosis not present

## 2023-02-27 DIAGNOSIS — H5203 Hypermetropia, bilateral: Secondary | ICD-10-CM | POA: Diagnosis not present

## 2023-02-27 DIAGNOSIS — H2511 Age-related nuclear cataract, right eye: Secondary | ICD-10-CM | POA: Diagnosis not present

## 2023-02-27 DIAGNOSIS — H40003 Preglaucoma, unspecified, bilateral: Secondary | ICD-10-CM | POA: Diagnosis not present

## 2023-02-27 DIAGNOSIS — H524 Presbyopia: Secondary | ICD-10-CM | POA: Diagnosis not present

## 2023-02-27 DIAGNOSIS — H04123 Dry eye syndrome of bilateral lacrimal glands: Secondary | ICD-10-CM | POA: Diagnosis not present

## 2023-02-27 DIAGNOSIS — H43813 Vitreous degeneration, bilateral: Secondary | ICD-10-CM | POA: Diagnosis not present

## 2023-02-27 DIAGNOSIS — H52203 Unspecified astigmatism, bilateral: Secondary | ICD-10-CM | POA: Diagnosis not present

## 2023-02-27 DIAGNOSIS — Z961 Presence of intraocular lens: Secondary | ICD-10-CM | POA: Diagnosis not present

## 2023-02-27 DIAGNOSIS — H25011 Cortical age-related cataract, right eye: Secondary | ICD-10-CM | POA: Diagnosis not present

## 2023-03-31 ENCOUNTER — Other Ambulatory Visit: Payer: Self-pay | Admitting: Nurse Practitioner

## 2023-04-08 ENCOUNTER — Other Ambulatory Visit: Payer: Self-pay | Admitting: Nurse Practitioner

## 2023-04-08 NOTE — Telephone Encounter (Signed)
Patient is requesting a refill of the following medications: Requested Prescriptions   Pending Prescriptions Disp Refills   LORazepam (ATIVAN) 0.5 MG tablet [Pharmacy Med Name: LORAZEPAM 0.5 MG TABLET] 30 tablet     Sig: TAKE 1/2 TO 1 TABLET BY MOUTH AT BEDTIME AS NEEDED FOR ANXIETY    Date of last refill: 10/09/2022  Refill amount: 30/1  Treatment agreement date: 08/03/21, notation made on pending appointment for October 2024

## 2023-04-18 ENCOUNTER — Encounter: Payer: Self-pay | Admitting: Nurse Practitioner

## 2023-04-18 ENCOUNTER — Non-Acute Institutional Stay: Payer: Medicare HMO | Admitting: Nurse Practitioner

## 2023-04-18 VITALS — BP 134/88 | HR 52 | Temp 97.8°F | Ht 63.0 in | Wt 143.0 lb

## 2023-04-18 DIAGNOSIS — M51369 Other intervertebral disc degeneration, lumbar region without mention of lumbar back pain or lower extremity pain: Secondary | ICD-10-CM | POA: Diagnosis not present

## 2023-04-18 DIAGNOSIS — Z8673 Personal history of transient ischemic attack (TIA), and cerebral infarction without residual deficits: Secondary | ICD-10-CM | POA: Diagnosis not present

## 2023-04-18 DIAGNOSIS — R001 Bradycardia, unspecified: Secondary | ICD-10-CM

## 2023-04-18 DIAGNOSIS — E871 Hypo-osmolality and hyponatremia: Secondary | ICD-10-CM | POA: Diagnosis not present

## 2023-04-18 DIAGNOSIS — M8580 Other specified disorders of bone density and structure, unspecified site: Secondary | ICD-10-CM | POA: Diagnosis not present

## 2023-04-18 DIAGNOSIS — F418 Other specified anxiety disorders: Secondary | ICD-10-CM

## 2023-04-18 DIAGNOSIS — E039 Hypothyroidism, unspecified: Secondary | ICD-10-CM

## 2023-04-18 DIAGNOSIS — E785 Hyperlipidemia, unspecified: Secondary | ICD-10-CM

## 2023-04-18 DIAGNOSIS — G43109 Migraine with aura, not intractable, without status migrainosus: Secondary | ICD-10-CM

## 2023-04-18 DIAGNOSIS — R5382 Chronic fatigue, unspecified: Secondary | ICD-10-CM

## 2023-04-18 DIAGNOSIS — K219 Gastro-esophageal reflux disease without esophagitis: Secondary | ICD-10-CM

## 2023-04-18 DIAGNOSIS — G3184 Mild cognitive impairment, so stated: Secondary | ICD-10-CM | POA: Diagnosis not present

## 2023-04-18 DIAGNOSIS — F5105 Insomnia due to other mental disorder: Secondary | ICD-10-CM | POA: Diagnosis not present

## 2023-04-18 DIAGNOSIS — N1831 Chronic kidney disease, stage 3a: Secondary | ICD-10-CM | POA: Diagnosis not present

## 2023-04-18 DIAGNOSIS — R7303 Prediabetes: Secondary | ICD-10-CM

## 2023-04-18 NOTE — Assessment & Plan Note (Signed)
Hgb A1c 5.8, continue life style modification, f/u Hgb A1c, TSH, CBC/diff, CMP/eGFR, lipids, Vit B12, Vit D 10/22/2023

## 2023-04-18 NOTE — Assessment & Plan Note (Signed)
Bun/creat 16/0.86 10/16/22

## 2023-04-18 NOTE — Assessment & Plan Note (Signed)
LDL 95 89 10/16/22,  takes Pravastatin, ASA

## 2023-04-18 NOTE — Assessment & Plan Note (Signed)
top of head and neck discomfort at times, not new. Mother had Parkinson/dementia, GDR of Gabapentin to reduce AR of cognitive decline.

## 2023-04-18 NOTE — Assessment & Plan Note (Signed)
followed by neurology, 03/11/22 MRI mild chronic small vessel ischemic changes,  remote age lacunar infarct in the left periventricular region

## 2023-04-18 NOTE — Assessment & Plan Note (Addendum)
Chronic lower back pain, lumbar radiculopathy,  f/u Ortho, off Gabapentin

## 2023-04-18 NOTE — Assessment & Plan Note (Signed)
takes  Levothyroxine, TSH 3.61 10/16/22

## 2023-04-18 NOTE — Assessment & Plan Note (Signed)
no recent fx. on Ca Vit D 

## 2023-04-18 NOTE — Progress Notes (Signed)
Location:   [facility]   Place of Service:  Clinic (12) Provider: Chipper Oman NP  Code Status: DNR Goals of Care:     04/18/2023   10:07 AM  Advanced Directives  Does Patient Have a Medical Advance Directive? Yes  Type of Advance Directive Out of facility DNR (pink MOST or yellow form)  Does patient want to make changes to medical advance directive? No - Patient declined  Pre-existing out of facility DNR order (yellow form or pink MOST form) Pink MOST form placed in chart (order not valid for inpatient use)     Chief Complaint  Patient presents with   Medical Management of Chronic Issues    6 month follow-up. Discuss need for flu vaccine and covid boosters. Sign treatment agreement for Lorazepam Areas of concern on skin and waking up tired. Patient c/o memory issues     HPI: Patient is a 82 y.o. female seen today for medical management of chronic diseases.      Hx of ocular migraine, top of head and neck discomfort at times, not new. Mother had Parkinson/dementia.             Glaucoma, under ophthalmology care.  CVA,  followed by neurology, 03/11/22 MRI mild chronic small vessel ischemic changes,  remote age lacunar infarct in the left periventricular region Mild memory loss, saw Neurology 02/07/22, 08/06/22 stopped taking Memantine, caused itching.              Hypothyroidism, takes  Levothyroxine, TSH 3.61 10/16/22             Depression/anxiety, prn Lorazepam, Sertraline. Denied depression.              CKD Bun/creat 16/0.86 10/16/22             Hyponatremia, Na 139 10/16/22             S/p Mitral valve repair             Chronic lower back pain, lumbar radiculopathy,  f/u Ortho, off  Gabapentin             Hyperlipidemia, LDL 95 89 10/16/22,  takes Pravastatin, ASA             Osteopenia, no recent fx. on Ca Vit D             GERD ,stable, off Esomeprazole, Hgb 13.7 10/16/22             Bradycardia, DOE, HR in 50, followed by Cardiology, 08/13/22 Echo EF 60-65             Prediabetes  Hgb A1c 5.8, continue life style modification, f/u Hgb A1c.      Past Medical History:  Diagnosis Date   Anxiety and depression    DDD (degenerative disc disease), lumbar    History of repair of mitral valve    Hypertensive kidney disease    Hypothyroid     Past Surgical History:  Procedure Laterality Date   ABDOMINAL HYSTERECTOMY  1974   Dr. Sharion Balloon, [facility] [place]   MITRAL VALVE REPAIR  2018   Dr. Ty Hilts, [facility]   TONSILLECTOMY  [address] [place]    Allergies  Allergen Reactions   Penicillin G Benzathine & Proc Other (See Comments)    Unknown.    Allergies as of 04/18/2023       Reactions   Penicillin G Benzathine & Proc Other (See Comments)   Unknown.  Medication List        Accurate as of April 18, 2023  3:34 PM. If you have any questions, ask your nurse or doctor.          STOP taking these medications    gabapentin 100 MG capsule Commonly known as: NEURONTIN Stopped by: Maxson Oddo X Margarine Grosshans       TAKE these medications    aspirin 81 MG chewable tablet Chew by mouth daily.   b complex vitamins tablet Take 1 tablet by mouth daily.   calcium-vitamin D 250-125 MG-UNIT tablet Commonly known as: OSCAL WITH D Take 1 tablet by mouth daily. Dose unknown-Patient to find out.   cholecalciferol 25 MCG (1000 UNIT) tablet Commonly known as: VITAMIN D3 Take 1,000 Units by mouth daily.   clindamycin 300 MG capsule Commonly known as: CLEOCIN Take 300 mg by mouth. Prior to dental work   levothyroxine 25 MCG tablet Commonly known as: SYNTHROID TAKE ONE & ONE-HALF TABLETS BY MOUTH DAILY BEFORE BREAKFAST.   LORazepam 0.5 MG tablet Commonly known as: ATIVAN TAKE 1/2 TO 1 TABLET BY MOUTH AT BEDTIME AS NEEDED FOR ANXIETY   MAGNESIUM GLYCINATE PO Take 1 capsule by mouth daily.   multivitamin capsule Take 1 capsule by mouth daily.   sertraline 50 MG tablet Commonly known as: ZOLOFT TAKE 1/2 TABLET BY MOUTH DAILY   vitamin  C 100 MG tablet Take 100 mg by mouth daily.        Review of Systems:  Review of Systems  Constitutional:  Positive for fatigue. Negative for appetite change and fever.       Not new, mild  HENT:  Negative for congestion and voice change.   Eyes:  Negative for visual disturbance.       Dry eyes  Respiratory:  Negative for shortness of breath and wheezing.        DOE  Cardiovascular:  Negative for leg swelling.  Gastrointestinal:  Negative for abdominal pain and constipation.       Hemorrhoidal external from previous exam  Genitourinary:  Negative for dysuria and frequency.  Musculoskeletal:  Positive for arthralgias and back pain. Negative for gait problem.       Occasional lower back pain.   Skin:  Negative for color change.       Hair thinning on top of head.   Neurological:  Negative for weakness and headaches.       Memory lapses.   Psychiatric/Behavioral:  Positive for sleep disturbance. Negative for confusion. The patient is not nervous/anxious.        Occasional     Health Maintenance  Topic Date Due   INFLUENZA VACCINE  02/14/2023   COVID-19 Vaccine (7 - 2023-24 season) 03/17/2023   Medicare Annual Wellness (AWV)  11/08/2023   DTaP/Tdap/Td (3 - Td or Tdap) 11/13/2032   Pneumonia Vaccine 27+ Years old  Completed   DEXA SCAN  Completed   Hepatitis C Screening  Completed   Zoster Vaccines- Shingrix  Completed   HPV VACCINES  Aged Out    Physical Exam: Vitals:   04/18/23 1007  BP: 134/88  Pulse: (!) 52  Temp: 97.8 F (36.6 C)  TempSrc: Temporal  SpO2: 95%  Weight: 143 lb (64.9 kg)  Height: 5\' 3"  (1.6 m)   Body mass index is 25.33 kg/m. Physical Exam Vitals and nursing note reviewed.  Constitutional:      Appearance: Normal appearance.  HENT:     Head: Normocephalic and atraumatic.  Nose: Nose normal.     Mouth/Throat:     Mouth: Mucous membranes are moist.  Eyes:     Conjunctiva/sclera: Conjunctivae normal.     Pupils: Pupils are equal,  round, and reactive to light.     Comments: dry eyes  Cardiovascular:     Rate and Rhythm: Regular rhythm. Bradycardia present.     Heart sounds: No murmur heard.    Comments: HR 50s Pulmonary:     Effort: Pulmonary effort is normal.     Breath sounds: No rales.  Abdominal:     General: Bowel sounds are normal.     Palpations: Abdomen is soft.     Tenderness: There is no abdominal tenderness.  Genitourinary:    Comments: Hemorrhoids external. Breast exam  Musculoskeletal:     Cervical back: Normal range of motion and neck supple.     Right lower leg: No edema.     Left lower leg: No edema.  Skin:    General: Skin is warm and dry.  Neurological:     General: No focal deficit present.     Mental Status: She is alert and oriented to person, place, and time. Mental status is at baseline.     Gait: Gait normal.  Psychiatric:        Mood and Affect: Mood normal.        Behavior: Behavior normal.        Thought Content: Thought content normal.        Judgment: Judgment normal.     Labs reviewed: Basic Metabolic Panel: Recent Labs    05/01/22 0737 10/16/22 0729  NA 140 139  K 4.3 4.4  CL 105 105  CO2 27 26  GLUCOSE 93 92  BUN 13 16  CREATININE 0.97 0.86  CALCIUM 9.0 9.3  TSH  --  3.61   Liver Function Tests: Recent Labs    10/16/22 0729  AST 22  ALT 13  BILITOT 0.6  PROT 6.6   No results for input(s): "LIPASE", "AMYLASE" in the last 8760 hours. No results for input(s): "AMMONIA" in the last 8760 hours. CBC: Recent Labs    10/16/22 0729  WBC 4.6  NEUTROABS 2,167  HGB 13.7  HCT 40.7  MCV 95.3  PLT 275   Lipid Panel: Recent Labs    10/16/22 0729  CHOL 172  HDL 56  LDLCALC 89  TRIG 171*  CHOLHDL 3.1   Lab Results  Component Value Date   HGBA1C 5.8 (H) 10/16/2022    Procedures since last visit: No results found.  Assessment/Plan  Prediabetes Hgb A1c 5.8, continue life style modification, f/u Hgb A1c, TSH, CBC/diff, CMP/eGFR, lipids, Vit  B12, Vit D 10/22/2023  Ocular migraine  top of head and neck discomfort at times, not new. Mother had Parkinson/dementia, GDR of Gabapentin to reduce AR of cognitive decline.   History of CVA (cerebrovascular accident)  followed by neurology, 03/11/22 MRI mild chronic small vessel ischemic changes,  remote age lacunar infarct in the left periventricular region  MCI (mild cognitive impairment) with memory loss saw Neurology 02/07/22, 08/06/22 stopped taking Memantine, caused itching.   Hypothyroidism takes  Levothyroxine, TSH 3.61 10/16/22  Insomnia secondary to depression with anxiety prn Lorazepam, Sertraline. Denied depression.   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 on Ca Vit D  GERD (gastroesophageal reflux disease) stable, off Esomeprazole, Hgb 13.7 10/16/22  Bradycardia  DOE, HR in 50, followed by Cardiology, 08/13/22 Echo EF 60-65  Fatigue May consider sleep study   Labs/tests ordered:  CBC/diff, CMP/eGFR, TSH, lipids, Hgb A1c, Vit D, Vit B12  Next appt:  6 months

## 2023-04-18 NOTE — Assessment & Plan Note (Signed)
May consider sleep study

## 2023-04-18 NOTE — Assessment & Plan Note (Signed)
prn Lorazepam, Sertraline. Denied depression.  

## 2023-04-18 NOTE — Assessment & Plan Note (Signed)
Na 139 10/16/22

## 2023-04-18 NOTE — Assessment & Plan Note (Signed)
DOE, HR in 50, followed by Cardiology, 08/13/22 Echo EF 60-65

## 2023-04-18 NOTE — Assessment & Plan Note (Signed)
saw Neurology 02/07/22, 08/06/22 stopped taking Memantine, caused itching.

## 2023-04-18 NOTE — Assessment & Plan Note (Signed)
stable, off Esomeprazole, Hgb 13.7 10/16/22

## 2023-05-06 ENCOUNTER — Encounter: Payer: Self-pay | Admitting: Family

## 2023-05-06 ENCOUNTER — Ambulatory Visit (INDEPENDENT_AMBULATORY_CARE_PROVIDER_SITE_OTHER): Payer: Medicare HMO | Admitting: Family

## 2023-05-06 VITALS — BP 121/78 | HR 56 | Temp 97.6°F | Resp 18 | Ht 63.0 in | Wt 141.6 lb

## 2023-05-06 DIAGNOSIS — R21 Rash and other nonspecific skin eruption: Secondary | ICD-10-CM | POA: Diagnosis not present

## 2023-05-06 MED ORDER — PREDNISONE 20 MG PO TABS
ORAL_TABLET | ORAL | 0 refills | Status: AC
Start: 2023-05-06 — End: 2023-05-10

## 2023-05-06 NOTE — Progress Notes (Signed)
Provider: Richarda Blade FNP-C  Mast, Man X, NP  Patient Care Team: Mast, Man X, NP as PCP - General (Internal Medicine)  Extended Emergency Contact Information Primary Emergency Contact: [facility] Address: [address]          [place], [place] [address] Darden Amber of [place] Work Phone: [phone] Mobile Phone: [phone] Relation: Daughter Secondary Emergency Contact: Jacinto Reap Address: [address]          [place], [place] [address] Darden Amber of [place] Work Phone: [phone] Mobile Phone: [phone] Relation: Son  Code Status:  Full Code  Goals of care: Advanced Directive information    05/06/2023    3:29 PM  Advanced Directives  Does Patient Have a Medical Advance Directive? Yes  Type of Advance Directive Out of facility DNR (pink MOST or yellow form)  Does patient want to make changes to medical advance directive? No - Patient declined  Pre-existing out of facility DNR order (yellow form or pink MOST form) Pink MOST form placed in chart (order not valid for inpatient use)     Chief Complaint  Patient presents with   Acute Visit    breaking out all over body     HPI:  Pt is a 82 y.o. female seen today for an acute visit for evaluation of rash breakout all over her the body.states worst on both arms.Rash comes up filled with fluid. she resides at [facility] facility.States her bedding was inspected at the facility for bed bugs was told no bed bugs.she recalls sitting shortly outside.states Husband sits outside more than her but did not get any bites. Not sure how long she has had the rash.she denies any fever or chills.No recent change in her detergent.lotion or soap.     Past Medical History:  Diagnosis Date   Anxiety and depression    DDD (degenerative disc disease), lumbar    History of repair of mitral valve    Hypertensive kidney disease    Hypothyroid    Past Surgical History:  Procedure Laterality Date    ABDOMINAL HYSTERECTOMY  1974   Dr. Sharion Balloon, [facility] [place]   MITRAL VALVE REPAIR  2018   Dr. Ty Hilts, [facility]   TONSILLECTOMY  [address] [place]    Allergies  Allergen Reactions   Penicillin G Benzathine & Proc Other (See Comments)    Unknown.    Outpatient Encounter Medications as of 05/06/2023  Medication Sig   Ascorbic Acid (VITAMIN C) 100 MG tablet Take 100 mg by mouth daily.   b complex vitamins tablet Take 1 tablet by mouth daily.   clindamycin (CLEOCIN) 300 MG capsule Take 300 mg by mouth. Prior to dental work   levothyroxine (SYNTHROID) 25 MCG tablet TAKE ONE & ONE-HALF TABLETS BY MOUTH DAILY BEFORE BREAKFAST.   LORazepam (ATIVAN) 0.5 MG tablet TAKE 1/2 TO 1 TABLET BY MOUTH AT BEDTIME AS NEEDED FOR ANXIETY   Multiple Vitamin (MULTIVITAMIN) capsule Take 1 capsule by mouth daily.   sertraline (ZOLOFT) 50 MG tablet TAKE 1/2 TABLET BY MOUTH DAILY   [DISCONTINUED] aspirin 81 MG chewable tablet Chew by mouth daily.   [DISCONTINUED] calcium-vitamin D (OSCAL WITH D) 250-125 MG-UNIT tablet Take 1 tablet by mouth daily. Dose unknown-Patient to find out.   [DISCONTINUED] cholecalciferol (VITAMIN D3) 25 MCG (1000 UT) tablet Take 1,000 Units by mouth daily.   [DISCONTINUED] MAGNESIUM GLYCINATE PO Take 1 capsule by mouth daily.   No facility-administered encounter medications on file as of  05/06/2023.    Review of Systems  Constitutional:  Negative for appetite change, chills, fatigue, fever and unexpected weight change.  HENT:  Negative for congestion, ear discharge, ear pain, facial swelling, hearing loss, nosebleeds, rhinorrhea, sinus pressure, sinus pain, sneezing, sore throat and tinnitus.   Eyes:  Negative for pain, discharge, redness, itching and visual disturbance.  Respiratory:  Negative for cough, chest tightness, shortness of breath and wheezing.   Cardiovascular:  Negative for chest pain, palpitations and leg swelling.  Gastrointestinal:  Negative for  abdominal distention, abdominal pain, blood in stool, constipation, diarrhea, nausea and vomiting.  Genitourinary:  Negative for dysuria and hematuria.  Musculoskeletal:  Negative for arthralgias, back pain, gait problem, joint swelling, myalgias, neck pain and neck stiffness.  Skin:  Positive for rash. Negative for color change, pallor and wound.  Neurological:  Negative for dizziness, weakness, light-headedness, numbness and headaches.  Hematological:  Does not bruise/bleed easily.  Psychiatric/Behavioral:  Negative for agitation, behavioral problems, confusion, hallucinations and sleep disturbance. The patient is not nervous/anxious.     Immunization History  Administered Date(s) Administered   Hepatitis A 05/24/2000   Hepatitis A, Ped/Adol-2 Dose 05/24/2000   IPV 05/24/2000   Influenza, High Dose Seasonal PF 04/15/2021   Influenza-Unspecified 05/07/2022   Moderna SARS-COV2 Booster Vaccination 05/24/2020, 11/14/2020   Moderna Sars-Covid-2 Vaccination 07/20/2019, 08/17/2019   Pfizer Covid-19 Vaccine Bivalent Booster 10yrs & up 04/04/2021, 05/17/2022   Pneumococcal Conjugate-13 10/21/2015   Pneumococcal Polysaccharide-23 07/12/2008   Tdap 08/16/2011, 11/14/2022   Zoster Recombinant(Shingrix) 11/14/2022, 01/19/2023   Zoster, Live 07/16/2009   Pertinent  Health Maintenance Due  Topic Date Due   INFLUENZA VACCINE  02/14/2023   DEXA SCAN  Completed      09/05/2021    9:34 AM 10/18/2022    2:20 PM 11/08/2022    2:45 PM 04/18/2023    2:02 PM 05/06/2023    3:29 PM  Fall Risk  Falls in the past year? 0 0 0 0 0  Was there an injury with Fall? 0 0 0 0 0  Fall Risk Category Calculator 0 0 0 0 0  Fall Risk Category (Retired) Low      (RETIRED) Patient Fall Risk Level Low fall risk      Patient at Risk for Falls Due to History of fall(s) No Fall Risks History of fall(s) No Fall Risks No Fall Risks  Fall risk Follow up Education provided;Falls prevention discussed;Falls evaluation completed  Falls evaluation completed Falls evaluation completed Falls evaluation completed Falls evaluation completed   Functional Status Survey:    Vitals:   05/06/23 1530  BP: 121/78  Pulse: (!) 56  Resp: 18  Temp: 97.6 F (36.4 C)  SpO2: 97%  Weight: 141 lb 9.6 oz (64.2 kg)  Height: 5\' 3"  (1.6 m)   Body mass index is 25.08 kg/m. Physical Exam Vitals reviewed.  Constitutional:      General: She is not in acute distress.    Appearance: Normal appearance. She is overweight. She is not ill-appearing or diaphoretic.  HENT:     Head: Normocephalic.     Nose: No congestion or rhinorrhea.     Mouth/Throat:     Mouth: Mucous membranes are moist.     Pharynx: Oropharynx is clear. No oropharyngeal exudate or posterior oropharyngeal erythema.  Eyes:     General: No scleral icterus.       Right eye: No discharge.        Left eye: No discharge.  Conjunctiva/sclera: Conjunctivae normal.     Pupils: Pupils are equal, round, and reactive to light.  Cardiovascular:     Rate and Rhythm: Normal rate and regular rhythm.     Pulses: Normal pulses.     Heart sounds: Normal heart sounds. No murmur heard.    No friction rub. No gallop.  Pulmonary:     Effort: Pulmonary effort is normal. No respiratory distress.     Breath sounds: Normal breath sounds. No wheezing, rhonchi or rales.  Chest:     Chest wall: No tenderness.  Abdominal:     General: Bowel sounds are normal. There is no distension.     Palpations: Abdomen is soft. There is no mass.     Tenderness: There is no abdominal tenderness. There is no right CVA tenderness, left CVA tenderness, guarding or rebound.  Musculoskeletal:        General: No swelling or tenderness. Normal range of motion.     Cervical back: Normal range of motion. No rigidity or tenderness.     Right lower leg: No edema.     Left lower leg: No edema.  Lymphadenopathy:     Cervical: No cervical adenopathy.  Skin:    General: Skin is warm and dry.      Coloration: Skin is not pale.     Findings: Rash present. No bruising, erythema or lesion.     Comments: Red raised rash some with clear fluid,others scabbed up scattered on the forearm and few on upper back.No rash on the trunk and lower extremities noted. Rash non-tender to touch.   Neurological:     Mental Status: She is alert and oriented to person, place, and time.     Cranial Nerves: No cranial nerve deficit.     Sensory: No sensory deficit.     Motor: No weakness.     Coordination: Coordination normal.     Gait: Gait normal.  Psychiatric:        Mood and Affect: Mood normal.        Speech: Speech normal.        Behavior: Behavior normal.     Labs reviewed: Recent Labs    10/16/22 0729  NA 139  K 4.4  CL 105  CO2 26  GLUCOSE 92  BUN 16  CREATININE 0.86  CALCIUM 9.3   Recent Labs    10/16/22 0729  AST 22  ALT 13  BILITOT 0.6  PROT 6.6   Recent Labs    10/16/22 0729  WBC 4.6  NEUTROABS 2,167  HGB 13.7  HCT 40.7  MCV 95.3  PLT 275   Lab Results  Component Value Date   TSH 3.61 10/16/2022   Lab Results  Component Value Date   HGBA1C 5.8 (H) 10/16/2022   Lab Results  Component Value Date   CHOL 172 10/16/2022   HDL 56 10/16/2022   LDLCALC 89 10/16/2022   TRIG 171 (H) 10/16/2022   CHOLHDL 3.1 10/16/2022    Significant Diagnostic Results in last 30 days:  No results found.  Assessment/Plan  Rash and nonspecific skin eruption Afebrile  Red raised rash some with clear fluid,others scabbed up scattered on the forearm and few on upper back.No rash on the trunk and lower extremities noted. Rash non-tender to touch.  - unclear etiology though suspected possible insect bite from sitting out on the porch.will start on prednisone as below.SE discussed.Advised to take with breakfast.  - Follow up with PCP Mast Man X NP at  the facility in 1 weeks if rash not cleared up.will call to cancel if rah is cleared.  - Advised to Notify provider if symptoms  worsen or fail to improve  - predniSONE (DELTASONE) 20 MG tablet; Take 2 tablets (40 mg total) by mouth daily with breakfast for 1 day, THEN 1.5 tablets (30 mg total) daily with breakfast for 1 day, THEN 1 tablet (20 mg total) daily with breakfast for 1 day, THEN 0.5 tablets (10 mg total) daily with breakfast for 1 day.  Dispense: 5 tablet; Refill: 0   Family/ staff Communication: Reviewed plan of care with patient verbalized understanding  Labs/tests ordered: None   Next Appointment: Return in about 1 week (around 05/13/2023) for follow up Rash .   Caesar Bookman, NP

## 2023-05-06 NOTE — Patient Instructions (Signed)
-   Notify provider if symptoms worsen or fail to improve  °

## 2023-05-23 ENCOUNTER — Encounter: Payer: Medicare HMO | Admitting: Nurse Practitioner

## 2023-06-03 ENCOUNTER — Encounter: Payer: Medicare HMO | Admitting: Orthopedic Surgery

## 2023-06-17 ENCOUNTER — Ambulatory Visit
Admission: RE | Admit: 2023-06-17 | Discharge: 2023-06-17 | Disposition: A | Discharge status: Home / Self Care | Payer: Medicare HMO | Source: Ambulatory Visit | Attending: Nurse Practitioner | Admitting: Nurse Practitioner

## 2023-06-17 DIAGNOSIS — N958 Other specified menopausal and perimenopausal disorders: Secondary | ICD-10-CM | POA: Diagnosis not present

## 2023-06-17 DIAGNOSIS — Z0000 Encounter for general adult medical examination without abnormal findings: Secondary | ICD-10-CM

## 2023-06-17 DIAGNOSIS — E2839 Other primary ovarian failure: Secondary | ICD-10-CM | POA: Diagnosis not present

## 2023-06-17 DIAGNOSIS — Z90722 Acquired absence of ovaries, bilateral: Secondary | ICD-10-CM | POA: Diagnosis not present

## 2023-06-18 ENCOUNTER — Telehealth: Payer: Self-pay

## 2023-06-20 ENCOUNTER — Encounter: Payer: Self-pay | Admitting: Nurse Practitioner

## 2023-06-21 ENCOUNTER — Encounter: Payer: Self-pay | Admitting: Sports Medicine

## 2023-06-21 ENCOUNTER — Non-Acute Institutional Stay: Payer: Medicare HMO | Admitting: Sports Medicine

## 2023-06-21 VITALS — BP 121/88 | HR 60 | Temp 96.7°F | Resp 18 | Ht 63.0 in | Wt 140.0 lb

## 2023-06-21 DIAGNOSIS — R413 Other amnesia: Secondary | ICD-10-CM

## 2023-06-21 DIAGNOSIS — M810 Age-related osteoporosis without current pathological fracture: Secondary | ICD-10-CM | POA: Diagnosis not present

## 2023-06-21 MED ORDER — ALENDRONATE SODIUM 70 MG PO TABS: 70.0000 mg | ORAL_TABLET | ORAL | 11 refills | Status: DC

## 2023-06-21 MED ORDER — MEMANTINE HCL 5 MG PO TABS: 5.0000 mg | ORAL_TABLET | Freq: Two times a day (BID) | ORAL | 1 refills | Status: DC

## 2023-06-21 NOTE — Progress Notes (Signed)
Careteam: Patient Care Team: Mast, Man X, NP as PCP - General (Internal Medicine)  PLACE OF SERVICE:  [facility] CLINIC  Advanced Directive information    Allergies  Allergen Reactions   Penicillin G Benzathine & Proc Other (See Comments)    Unknown.    Chief Complaint  Patient presents with   Medication Management     Discuss restarting medication   Immunizations    Covid and Influenza     HPI: Patient is a 82 y.o. female is here for dexa results and refill on namenda  Osteoporosis 1. All patients should optimize calcium and vitamin D intake. 2. Consider FDA-approved medical therapies in postmenopausal women and men aged 58 years and older, based on the following: a. A hip or vertebral (clinical or morphometric) fracture. b. T-score = -2.5 at the femoral neck or spine after appropriate evaluation to exclude secondary causes. c. Low bone mass (T-score between -1.0 and -2.5 at the femoral neck or spine) and a 10-year probability of a hip fracture = 3% or a 10-year probability of a major osteoporosis-related fracture = 20% based on the [place]-adapted WHO algorithm. d. Clinician judgment and/or patient preferences may indicate treatment for people with 10-year fracture probabilities above or below these levels.  Memory impairment  C/o forgetful  Able to check her back statements on the computer  Her husband helps with finances On namenda but stopped taking it and want to restart taking  Does walk for 30 min daily Enjoys doing crossword puzzles  Review of Systems:  Review of Systems  Constitutional:  Negative for chills and fever.  HENT:  Negative for congestion and sore throat.   Eyes:  Negative for double vision.  Respiratory:  Negative for cough, sputum production and shortness of breath.   Cardiovascular:  Negative for chest pain, palpitations and leg swelling.  Gastrointestinal:  Negative for abdominal pain, heartburn and nausea.  Genitourinary:  Negative for  dysuria, frequency and hematuria.  Musculoskeletal:  Negative for falls and myalgias.  Neurological:  Negative for dizziness, sensory change and focal weakness.   Negative unless indicated in HPI.   Past Medical History:  Diagnosis Date   Anxiety and depression    DDD (degenerative disc disease), lumbar    History of repair of mitral valve    Hypertensive kidney disease    Hypothyroid    Past Surgical History:  Procedure Laterality Date   ABDOMINAL HYSTERECTOMY  1974   Dr. Sharion Balloon, [facility] [place]   MITRAL VALVE REPAIR  2018   Dr. Ty Hilts, [facility]   TONSILLECTOMY  [address] [place]   Social History:   reports that she has never smoked. She has never used smokeless tobacco. She reports current alcohol use. She reports that she does not use drugs.  Family History  Problem Relation Age of Onset   Parkinson's disease Mother    Dementia Mother    Prostate cancer Father    Stroke Sister    High Cholesterol Sister    Anxiety disorder Brother    Depression Brother    Panic disorder Brother    Dementia Maternal Grandfather    Parkinson's disease Maternal Grandfather     Medications: Patient's Medications  New Prescriptions   No medications on file  Previous Medications   ASCORBIC ACID (VITAMIN C) 100 MG TABLET    Take 100 mg by mouth daily.   B COMPLEX VITAMINS TABLET    Take 1 tablet by mouth daily.  CLINDAMYCIN (CLEOCIN) 300 MG CAPSULE    Take 300 mg by mouth. Prior to dental work   LEVOTHYROXINE (SYNTHROID) 25 MCG TABLET    TAKE ONE & ONE-HALF TABLETS BY MOUTH DAILY BEFORE BREAKFAST.   LORAZEPAM (ATIVAN) 0.5 MG TABLET    TAKE 1/2 TO 1 TABLET BY MOUTH AT BEDTIME AS NEEDED FOR ANXIETY   MULTIPLE VITAMIN (MULTIVITAMIN) CAPSULE    Take 1 capsule by mouth daily.   SERTRALINE (ZOLOFT) 50 MG TABLET    TAKE 1/2 TABLET BY MOUTH DAILY  Modified Medications   No medications on file  Discontinued Medications   No medications on file    Physical  Exam: Vitals:   06/21/23 0842  BP: 121/88  Pulse: 60  Resp: 18  Temp: (!) 96.7 F (35.9 C)  SpO2: 99%  Weight: 140 lb (63.5 kg)  Height: 5\' 3"  (1.6 m)   Body mass index is 24.8 kg/m. BP Readings from Last 3 Encounters:  06/21/23 121/88  05/06/23 121/78  04/18/23 134/88   Wt Readings from Last 3 Encounters:  06/21/23 140 lb (63.5 kg)  05/06/23 141 lb 9.6 oz (64.2 kg)  04/18/23 143 lb (64.9 kg)    Physical Exam Constitutional:      Appearance: Normal appearance.  HENT:     Head: Normocephalic and atraumatic.  Cardiovascular:     Rate and Rhythm: Normal rate and regular rhythm.  Pulmonary:     Effort: Pulmonary effort is normal. No respiratory distress.     Breath sounds: Normal breath sounds. No wheezing.  Abdominal:     General: Bowel sounds are normal. There is no distension.     Tenderness: There is no abdominal tenderness. There is no guarding or rebound.     Comments:    Musculoskeletal:        General: No swelling or tenderness.  Skin:    General: Skin is dry.  Neurological:     Mental Status: She is alert. Mental status is at baseline.     Sensory: No sensory deficit.     Motor: No weakness.     Labs reviewed: Basic Metabolic Panel: Recent Labs    10/16/22 0729  NA 139  K 4.4  CL 105  CO2 26  GLUCOSE 92  BUN 16  CREATININE 0.86  CALCIUM 9.3  TSH 3.61   Liver Function Tests: Recent Labs    10/16/22 0729  AST 22  ALT 13  BILITOT 0.6  PROT 6.6   No results for input(s): "LIPASE", "AMYLASE" in the last 8760 hours. No results for input(s): "AMMONIA" in the last 8760 hours. CBC: Recent Labs    10/16/22 0729  WBC 4.6  NEUTROABS 2,167  HGB 13.7  HCT 40.7  MCV 95.3  PLT 275   Lipid Panel: Recent Labs    10/16/22 0729  CHOL 172  HDL 56  LDLCALC 89  TRIG 171*  CHOLHDL 3.1   TSH: Recent Labs    10/16/22 0729  TSH 3.61   A1C: Lab Results  Component Value Date   HGBA1C 5.8 (H) 10/16/2022     Assessment/Plan 1. Age  related osteoporosis, unspecified pathological fracture presence Sent fosamax to her pharmacy  Informed patient about the side effects of the medications including gerd, osteonecrosis and atypical femur fractures Instructed to take calcium, vit d  Increase weight bearing exercises  - alendronate (FOSAMAX) 70 MG tablet; Take 1 tablet (70 mg total) by mouth every 7 (seven) days. Take with a full glass of water  on an empty stomach.  Dispense: 4 tablet; Refill: 11  2. Memory impairment Filled namenda Instructed to increase cognitively engaging activities - memantine (NAMENDA) 5 MG tablet; Take 1 tablet (5 mg total) by mouth 2 (two) times daily.  Dispense: 180 tablet; Refill: 1   No follow-ups on file.:  instructed patient to do blood work which was ordered by her PCP

## 2023-07-01 NOTE — Telephone Encounter (Signed)
error 

## 2023-07-14 ENCOUNTER — Other Ambulatory Visit: Payer: Self-pay | Admitting: Nurse Practitioner

## 2023-07-14 DIAGNOSIS — F3341 Major depressive disorder, recurrent, in partial remission: Secondary | ICD-10-CM

## 2023-08-10 DIAGNOSIS — Z008 Encounter for other general examination: Secondary | ICD-10-CM | POA: Diagnosis not present

## 2023-09-13 ENCOUNTER — Encounter: Payer: Self-pay | Admitting: Sports Medicine

## 2023-09-13 ENCOUNTER — Non-Acute Institutional Stay: Payer: Medicare HMO | Admitting: Sports Medicine

## 2023-09-13 VITALS — BP 128/88 | HR 81 | Temp 96.7°F | Resp 18 | Ht 63.0 in | Wt 139.4 lb

## 2023-09-13 DIAGNOSIS — R5383 Other fatigue: Secondary | ICD-10-CM | POA: Diagnosis not present

## 2023-09-13 DIAGNOSIS — F411 Generalized anxiety disorder: Secondary | ICD-10-CM | POA: Diagnosis not present

## 2023-09-13 DIAGNOSIS — R413 Other amnesia: Secondary | ICD-10-CM | POA: Diagnosis not present

## 2023-09-13 DIAGNOSIS — I471 Supraventricular tachycardia, unspecified: Secondary | ICD-10-CM | POA: Diagnosis not present

## 2023-09-13 DIAGNOSIS — E559 Vitamin D deficiency, unspecified: Secondary | ICD-10-CM

## 2023-09-13 DIAGNOSIS — R0602 Shortness of breath: Secondary | ICD-10-CM

## 2023-09-13 DIAGNOSIS — F321 Major depressive disorder, single episode, moderate: Secondary | ICD-10-CM

## 2023-09-13 MED ORDER — VENLAFAXINE HCL ER 75 MG PO CP24: 75.0000 mg | ORAL_CAPSULE | Freq: Every day | ORAL | 0 refills | Status: DC

## 2023-09-13 NOTE — Progress Notes (Signed)
 Careteam: Patient Care Team: Mast, Man X, NP as PCP - General (Internal Medicine)  PLACE OF SERVICE:  [facility] CLIN[facility]  Advanced Directive information Does Patient Have a Medical Advance Directive?: No, Would patient like information on creating a medical advance directive?: No - Patient declined  Allergies  Allergen Reactions   Penicillin G Benzathine & Proc Other (See Comments)    Unknown.    Chief Complaint  Patient presents with   Medical Management of Chronic Issues    discuss EKG results      Discussed the use of AI scribe software for clinical note transcription with the patient, who gave verbal consent to proceed.  History of Present Illness   Kristine [name] is an 83 year old female who presents with memory lapses and low energy. She is accompanied by her husband, [name].  She experiences memory lapses despite taking memantine twice daily, with minimal improvement. She has difficulty remembering names when reading and needs to write them down to recall. She cannot stay focused on books and struggles with TV storylines.  She reports low energy levels and   fatigue persisting for the past year or two and a noticeable decrease in energy over the last month. She walks slower than before but maintains a daily walking routine of 30 minutes at a good pace without dizziness, lightheadedness, chest pain, or shortness of breath.  She experiences occasional chest pain at rest, lasting seconds to minutes, without radiation to the arm or neck. No nausea, dizziness, or lightheadedness is associated with the chest pain. However denies chest pain with exertion. She wore 2 week heart monitor by insurance which showed runs of supraventricular tachycardia.  She feels down and depressed more frequently over the past few months, with a lack of interest in activities she used to enjoy. She has no issues with sleep but has a reduced appetite, eating smaller portions. She denies feeling bad about  herself but is saddened by her decreased energy levels. She experiences anxiety, particularly in large groups, and worries about various things, including her husband's upcoming trip. She occasionally takes lorazepam for anxiety and denies thoughts of self-harm but is concerned about her mental decline.         Review of Systems:  Review of Systems  Constitutional:  Positive for malaise/fatigue. Negative for chills and fever.  HENT:  Negative for congestion and sore throat.   Eyes:  Negative for double vision.  Respiratory:  Negative for cough, sputum production and shortness of breath.   Cardiovascular:  Negative for chest pain, palpitations and leg swelling.  Gastrointestinal:  Negative for abdominal pain, heartburn and nausea.  Genitourinary:  Negative for dysuria, frequency and hematuria.  Musculoskeletal:  Negative for falls and myalgias.  Neurological:  Negative for dizziness, sensory change and focal weakness.  Psychiatric/Behavioral:  Positive for depression and memory loss. Negative for substance abuse. The patient is nervous/anxious.    Negative unless indicated in HPI.   Past Medical History:  Diagnosis Date   Anxiety and depression    DDD (degenerative disc disease), lumbar    History of repair of mitral valve    Hypertensive kidney disease    Hypothyroid    Past Surgical History:  Procedure Laterality Date   ABDOMINAL HYSTERECTOMY  1974   Dr. Sharion Balloon, [facility] [place]   MITRAL VALVE REPAIR  2018   Dr. Ty Hilts, [facility]   TONSILLECTOMY  [address]   Social History:   reports that  she has never smoked. She has never used smokeless tobacco. She reports current alcohol use. She reports that she does not use drugs.  Family History  Problem Relation Age of Onset   Parkinson's disease Mother    Dementia Mother    Prostate cancer Father    Stroke Sister    High Cholesterol Sister    Anxiety disorder Brother    Depression Brother    Panic  disorder Brother    Dementia Maternal Grandfather    Parkinson's disease Maternal Grandfather     Medications: Patient's Medications  New Prescriptions   VENLAFAXINE XR (EFFEXOR XR) 75 MG 24 HR CAPSULE    Take 1 capsule (75 mg total) by mouth daily with breakfast.  Previous Medications   ALENDRONATE (FOSAMAX) 70 MG TABLET    Take 1 tablet (70 mg total) by mouth every 7 (seven) days. Take with a full glass of water on an empty stomach.   ASCORBIC ACID (VITAMIN C) 100 MG TABLET    Take 100 mg by mouth daily.   B COMPLEX VITAMINS TABLET    Take 1 tablet by mouth daily.   CLINDAMYCIN (CLEOCIN) 300 MG CAPSULE    Take 300 mg by mouth. Prior to dental work   LEVOTHYROXINE (SYNTHROID) 25 MCG TABLET    TAKE ONE & ONE-HALF TABLETS BY MOUTH DAILY BEFORE BREAKFAST.   MEMANTINE (NAMENDA) 5 MG TABLET    Take 1 tablet (5 mg total) by mouth 2 (two) times daily.   MULTIPLE VITAMIN (MULTIVITAMIN) CAPSULE    Take 1 capsule by mouth daily.  Modified Medications   No medications on file  Discontinued Medications   LORAZEPAM (ATIVAN) 0.5 MG TABLET    TAKE 1/2 TO 1 TABLET BY MOUTH AT BEDTIME AS NEEDED FOR ANXIETY   SERTRALINE (ZOLOFT) 50 MG TABLET    TAKE 1/2 TABLET BY MOUTH DAILY    Physical Exam: Vitals:   09/13/23 0905  BP: 128/88  Pulse: 81  Resp: 18  Temp: (!) 96.7 F (35.9 C)  SpO2: 97%  Weight: 139 lb 6.4 oz (63.2 kg)  Height: 5\' 3"  (1.6 m)   Body mass index is 24.69 kg/m. BP Readings from Last 3 Encounters:  09/13/23 128/88  06/21/23 121/88  05/06/23 121/78   Wt Readings from Last 3 Encounters:  09/13/23 139 lb 6.4 oz (63.2 kg)  06/21/23 140 lb (63.5 kg)  05/06/23 141 lb 9.6 oz (64.2 kg)    Physical Exam Constitutional:      Appearance: Normal appearance.  HENT:     Head: Normocephalic and atraumatic.  Cardiovascular:     Rate and Rhythm: Normal rate and regular rhythm.  Pulmonary:     Effort: Pulmonary effort is normal. No respiratory distress.     Breath sounds: Normal  breath sounds. No wheezing.  Abdominal:     General: Bowel sounds are normal. There is no distension.     Tenderness: There is no abdominal tenderness. There is no guarding or rebound.     Comments:    Musculoskeletal:        General: No swelling or tenderness.  Neurological:     Mental Status: She is alert. Mental status is at baseline.     Motor: No weakness.     Labs reviewed: Basic Metabolic Panel: Recent Labs    10/16/22 0729  NA 139  K 4.4  CL 105  CO2 26  GLUCOSE 92  BUN 16  CREATININE 0.86  CALCIUM 9.3  TSH 3.61  Liver Function Tests: Recent Labs    10/16/22 0729  AST 22  ALT 13  BILITOT 0.6  PROT 6.6   No results for input(s): "LIPASE", "AMYLASE" in the last 8760 hours. No results for input(s): "AMMONIA" in the last 8760 hours. CBC: Recent Labs    10/16/22 0729  WBC 4.6  NEUTROABS 2,167  HGB 13.7  HCT 40.7  MCV 95.3  PLT 275   Lipid Panel: Recent Labs    10/16/22 0729  CHOL 172  HDL 56  LDLCALC 89  TRIG 171*  CHOLHDL 3.1   TSH: Recent Labs    10/16/22 0729  TSH 3.61   A1C: Lab Results  Component Value Date   HGBA1C 5.8 (H) 10/16/2022    Assessment and Plan 1. GAD (generalized anxiety disorder) (Primary) GAD-9 - venlafaxine XR (EFFEXOR XR) 75 MG 24 hr capsule; Take 1 capsule (75 mg total) by mouth daily with breakfast.  Dispense: 30 capsule; Refill: 0  2. Current moderate episode of major depressive disorder without prior episode (HCC) Severe depression with significant impact on energy levels and memory. Currently on Zoloft 25mg  daily. No suicidal ideation. Phq9- 15 -Discontinue Zoloft. -Start Effexor, dose unspecified, once daily. -Schedule follow-up in 4 weeks to assess response to Effexor. - venlafaxine XR (EFFEXOR XR) 75 MG 24 hr capsule; Take 1 capsule (75 mg total) by mouth daily with breakfast.  Dispense: 30 capsule; Refill: 0  3. Other fatigue  - CBC With Differential/Platelet - Complete Metabolic Panel with  eGFR - TSH - Vitamin B12  4. SVT (supraventricular tachycardia) (HCC) Reports slower heart rate and low energy. EKG monitoring shows occasional episodes of tachycardia. No significant abnormal rhythms or blocks noted. -Order echocardiogram to assess heart function. -Refer to Cardiology for further evaluation. - ECHOCARDIOGRAM COMPLETE; Future - Ambulatory referral to Cardiology  5. SOB (shortness of breath) on exertion - ECHOCARDIOGRAM COMPLETE; Future  6. Memory deficits Multifactorial  Depression could be contributing for worsening memory Will follow up in 4 weeks Cont namenda - Vitamin B12  7. Vitamin D deficiency  - Vitamin D, 25-hydroxy          Return in about 4 weeks (around 10/11/2023).:   40 min Total time spent for obtaining history,  performing a medically appropriate examination and evaluation, reviewing the tests,   documenting clinical information in the electronic or other health record,  ,care coordination (not separately reported)

## 2023-09-13 NOTE — Patient Instructions (Signed)
-  DEPRESSION: Depression is a mood disorder that causes persistent feelings of sadness and loss of interest. We will discontinue your current medication, Zoloft, and start you on Effexor once daily. Please follow up in 4 weeks to see how you are responding to the new medication.   Echo  Cardiology referral  Lab work Tuesday

## 2023-09-17 ENCOUNTER — Other Ambulatory Visit: Payer: Medicare HMO

## 2023-09-17 DIAGNOSIS — R5383 Other fatigue: Secondary | ICD-10-CM | POA: Diagnosis not present

## 2023-09-17 DIAGNOSIS — E559 Vitamin D deficiency, unspecified: Secondary | ICD-10-CM | POA: Diagnosis not present

## 2023-09-17 DIAGNOSIS — R413 Other amnesia: Secondary | ICD-10-CM | POA: Diagnosis not present

## 2023-09-17 LAB — COMPLETE METABOLIC PANEL WITH GFR
AG Ratio: 1.8 (calc) (ref 1.0–2.5)
ALT: 15 U/L (ref 6–29)
AST: 22 U/L (ref 10–35)
Albumin: 4.5 g/dL (ref 3.6–5.1)
Alkaline phosphatase (APISO): 75 U/L (ref 37–153)
BUN: 12 mg/dL (ref 7–25)
CO2: 29 mmol/L (ref 20–32)
Calcium: 9.2 mg/dL (ref 8.6–10.4)
Chloride: 103 mmol/L (ref 98–110)
Creat: 0.93 mg/dL (ref 0.60–0.95)
Globulin: 2.5 g/dL (ref 1.9–3.7)
Glucose, Bld: 92 mg/dL (ref 65–99)
Potassium: 4.6 mmol/L (ref 3.5–5.3)
Sodium: 139 mmol/L (ref 135–146)
Total Bilirubin: 0.5 mg/dL (ref 0.2–1.2)
Total Protein: 7 g/dL (ref 6.1–8.1)
eGFR: 62 mL/min/{1.73_m2} (ref >=60)

## 2023-09-17 LAB — CBC WITH DIFFERENTIAL/PLATELET
Absolute Lymphocytes: 2331 {cells}/uL (ref 850–3900)
Absolute Monocytes: 536 {cells}/uL (ref 200–950)
Basophils Absolute: 29 {cells}/uL (ref 0–200)
Basophils Relative: 0.5 %
Eosinophils Absolute: 80 {cells}/uL (ref 15–500)
Eosinophils Relative: 1.4 %
HCT: 46.4 % — ABNORMAL HIGH (ref 35.0–45.0)
Hemoglobin: 15.2 g/dL (ref 11.7–15.5)
MCH: 31.7 pg (ref 27.0–33.0)
MCHC: 32.8 g/dL (ref 32.0–36.0)
MCV: 96.7 fL (ref 80.0–100.0)
MPV: 10.3 fL (ref 7.5–12.5)
Monocytes Relative: 9.4 %
Neutro Abs: 2725 {cells}/uL (ref 1500–7800)
Neutrophils Relative %: 47.8 %
Platelets: 291 10*3/uL (ref 140–400)
RBC: 4.8 10*6/uL (ref 3.80–5.10)
RDW: 12.2 % (ref 11.0–15.0)
Total Lymphocyte: 40.9 %
WBC: 5.7 10*3/uL (ref 3.8–10.8)

## 2023-09-17 LAB — TSH: TSH: 2.75 m[IU]/L (ref 0.40–4.50)

## 2023-09-17 LAB — VITAMIN B12: Vitamin B-12: 735 pg/mL (ref 200–1100)

## 2023-09-17 LAB — VITAMIN D 25 HYDROXY (VIT D DEFICIENCY, FRACTURES): Vit D, 25-Hydroxy: 39 ng/mL (ref 30–100)

## 2023-09-26 ENCOUNTER — Encounter (HOSPITAL_COMMUNITY): Payer: Self-pay

## 2023-09-26 ENCOUNTER — Ambulatory Visit (HOSPITAL_COMMUNITY)
Admission: RE | Admit: 2023-09-26 | Discharge: 2023-09-26 | Disposition: A | Discharge status: Home / Self Care | Source: Ambulatory Visit | Attending: Sports Medicine | Admitting: Sports Medicine

## 2023-09-26 DIAGNOSIS — Q2112 Patent foramen ovale: Secondary | ICD-10-CM | POA: Insufficient documentation

## 2023-09-26 DIAGNOSIS — B9689 Other specified bacterial agents as the cause of diseases classified elsewhere: Secondary | ICD-10-CM | POA: Insufficient documentation

## 2023-09-26 DIAGNOSIS — I330 Acute and subacute infective endocarditis: Secondary | ICD-10-CM | POA: Insufficient documentation

## 2023-09-26 DIAGNOSIS — I340 Nonrheumatic mitral (valve) insufficiency: Secondary | ICD-10-CM | POA: Insufficient documentation

## 2023-09-26 DIAGNOSIS — R4182 Altered mental status, unspecified: Secondary | ICD-10-CM | POA: Insufficient documentation

## 2023-09-26 DIAGNOSIS — R0602 Shortness of breath: Secondary | ICD-10-CM | POA: Diagnosis not present

## 2023-09-26 DIAGNOSIS — R001 Bradycardia, unspecified: Secondary | ICD-10-CM | POA: Insufficient documentation

## 2023-09-26 DIAGNOSIS — I471 Supraventricular tachycardia, unspecified: Secondary | ICD-10-CM | POA: Diagnosis not present

## 2023-09-26 HISTORY — PX: TRANSTHORACIC ECHOCARDIOGRAM: SHX275

## 2023-09-26 LAB — ECHOCARDIOGRAM COMPLETE
Area-P 1/2: 1.91 cm2
Calc EF: 58.3 %
MV VTI: 1.2 cm2
S' Lateral: 2.7 cm
Single Plane A2C EF: 54.8 %
Single Plane A4C EF: 63.9 %

## 2023-09-26 NOTE — Progress Notes (Signed)
  Echocardiogram 2D Echocardiogram has been performed.  Kristine [name] 09/26/2023, 2:04 PM

## 2023-10-01 ENCOUNTER — Telehealth: Payer: Self-pay | Admitting: Sports Medicine

## 2023-10-01 NOTE — Telephone Encounter (Signed)
 Patient is active on mychart and can view normal results. Mychart message sent with providers response and results.

## 2023-10-01 NOTE — Telephone Encounter (Signed)
 1. Left ventricular ejection fraction, by estimation, is 60 to 65%. The left ventricle has normal function. The left ventricle has no regional wall motion abnormalities. Left ventricular diastolic function could not be evaluated. There is abnormal  (paradoxical) septal motion, consistent with right ventricular volume overload.  2. Right ventricular systolic function is normal. The right ventricular size is normal. Tricuspid regurgitation signal is inadequate for assessing PA pressure.  3. The mitral valve has been repaired/replaced. Mild mitral valve regurgitation. Mild to moderate mitral stenosis. The mean mitral valve gradient is 3.0 mmHg with average heart rate of 48 bpm. There is a prosthetic annuloplasty ring present in the  mitral position. Procedure Date: Unknown. Echo findings are consistent with normal structure and function of the mitral valve prosthesis.  4. The aortic valve is tricuspid. Aortic valve regurgitation is trivial. No aortic stenosis is present.  5. The inferior vena cava is normal in size with greater than 50% respiratory variability, suggesting right atrial pressure of 3 mmHg.

## 2023-10-05 ENCOUNTER — Other Ambulatory Visit: Payer: Self-pay | Admitting: Sports Medicine

## 2023-10-05 DIAGNOSIS — F411 Generalized anxiety disorder: Secondary | ICD-10-CM

## 2023-10-05 DIAGNOSIS — F321 Major depressive disorder, single episode, moderate: Secondary | ICD-10-CM

## 2023-10-11 ENCOUNTER — Encounter: Payer: Medicare HMO | Admitting: Sports Medicine

## 2023-10-17 ENCOUNTER — Other Ambulatory Visit

## 2023-10-17 ENCOUNTER — Telehealth: Payer: Self-pay

## 2023-10-17 NOTE — Telephone Encounter (Signed)
 Copied from CRM [id number]. Topic: General - Call Back - No Documentation >> Oct 17, 2023  8:39 AM Irine Seal wrote: Reason for CRM: The patient called regarding a scheduled lab appointment at 7:30 AM. She arrived at the location but found no staff present. Since the labs required fasting, the patient had not eaten and wanted to see if she can still be seen, spoke to Clydie Braun at [facility], who confirmed that they are aware of the issue and are in the process of making some changes. Clydie Braun also informed me that she had spoken with the Research officer, political party. She advised that a nurse would contact the patient shortly. I relayed this information to the patient, who acknowledged understanding and provided a callback number: [phone]. >> Oct 17, 2023  9:54 AM Hector Shade B wrote: Ms. Kristine Ou RN called in regards to patient being scheduled for labs this morning, but when she went to have labs drawn no one was available. [name] has requested a requisition of the lab work that was supposed to be done and would like it faxed over to [phone], and has also requested a callback to [phone]   I called Kristine [name] and left a detailed message informing her that the reason patient did not get labs had nothing to do with the orders as the orders are visible, the reason patient did not get labs was due to confusion on the patients end, as she drove to our [address] location, despite me telling her when I scheduled the appointment that labs would be done at [facility], Clydie Braun (front desk patient care advocate) left her a message yesterday also informing her that labs were at [facility] however patient drove here and then drove back to [facility] and the lab tech had already left that location.

## 2023-10-18 ENCOUNTER — Encounter: Payer: Medicare HMO | Admitting: Sports Medicine

## 2023-10-18 ENCOUNTER — Encounter: Payer: Self-pay | Admitting: Sports Medicine

## 2023-10-18 ENCOUNTER — Non-Acute Institutional Stay: Admitting: Sports Medicine

## 2023-10-18 VITALS — BP 127/78 | HR 68 | Temp 97.4°F | Resp 18 | Ht 63.0 in | Wt 140.2 lb

## 2023-10-18 DIAGNOSIS — E039 Hypothyroidism, unspecified: Secondary | ICD-10-CM

## 2023-10-18 DIAGNOSIS — F321 Major depressive disorder, single episode, moderate: Secondary | ICD-10-CM | POA: Diagnosis not present

## 2023-10-18 DIAGNOSIS — R413 Other amnesia: Secondary | ICD-10-CM

## 2023-10-18 DIAGNOSIS — F411 Generalized anxiety disorder: Secondary | ICD-10-CM

## 2023-10-18 MED ORDER — MEMANTINE HCL 10 MG PO TABS: 10.0000 mg | ORAL_TABLET | Freq: Two times a day (BID) | ORAL | 1 refills | Status: DC

## 2023-10-18 NOTE — Progress Notes (Signed)
 Careteam: Patient Care Team: Venita Sheffield, MD as PCP - General (Internal Medicine)  PLACE OF SERVICE:  [facility]  Advanced Directive information    Allergies  Allergen Reactions   Penicillin G Benzathine & Proc Other (See Comments)    Unknown.    Chief Complaint  Patient presents with   Medical Management of Chronic Issues    4 Weeks Follow-up/needs to discuss Covid vaccine      Discussed the use of AI scribe software for clinical note transcription with the patient, who gave verbal consent to proceed.  History of Present Illness  Kristine [name] is an 83 year old female who presents for follow-up on depression and memory concerns.   She is currently taking memantine at a dose of 5 mg . She uses a chart to remember her medications and does not require assistance from her husband. Occasionally needs to check the calendar for dates but knows the names of people she frequently interacts with.  Her depression symptoms have improved. She engages in yoga exercises on Monday, Wednesday, Thursday, and most Fridays. Her appetite remains unchanged, and she has no feelings of being down or depressed. She notes an increase in energy levels and does not experience negative thoughts about herself. She is able to focus and concentrate without crying.     Review of Systems:  Review of Systems  Constitutional:  Negative for chills and fever.  HENT:  Negative for congestion and sore throat.   Eyes:  Negative for double vision.  Respiratory:  Negative for cough, sputum production and shortness of breath.   Cardiovascular:  Negative for chest pain, palpitations and leg swelling.  Gastrointestinal:  Negative for abdominal pain, heartburn and nausea.  Genitourinary:  Negative for dysuria, frequency and hematuria.  Musculoskeletal:  Negative for falls and myalgias.  Neurological:  Negative for dizziness, sensory change and focal weakness.  Psychiatric/Behavioral:  Positive for  memory loss.    Negative unless indicated in HPI.   Past Medical History:  Diagnosis Date   Anxiety and depression    DDD (degenerative disc disease), lumbar    History of repair of mitral valve    Hypertensive kidney disease    Hypothyroid    Past Surgical History:  Procedure Laterality Date   ABDOMINAL HYSTERECTOMY  1974   Dr. Sharion Balloon, [facility] [place]   MITRAL VALVE REPAIR  2018   Dr. Ty Hilts, [facility]   TONSILLECTOMY  [address]    Social History:   reports that she has never smoked. She has never used smokeless tobacco. She reports current alcohol use. She reports that she does not use drugs.  Family History  Problem Relation Age of Onset   Parkinson's disease Mother    Dementia Mother    Prostate cancer Father    Stroke Sister    High Cholesterol Sister    Anxiety disorder Brother    Depression Brother    Panic disorder Brother    Dementia Maternal Grandfather    Parkinson's disease Maternal Grandfather     Medications: Patient's Medications  New Prescriptions   MEMANTINE (NAMENDA) 10 MG TABLET    Take 1 tablet (10 mg total) by mouth 2 (two) times daily.  Previous Medications   ALENDRONATE (FOSAMAX) 70 MG TABLET    Take 1 tablet (70 mg total) by mouth every 7 (seven) days. Take with a full glass of water on an empty stomach.   ASCORBIC ACID (VITAMIN C) 100 MG TABLET  Take 100 mg by mouth daily.   B COMPLEX VITAMINS TABLET    Take 1 tablet by mouth daily.   LEVOTHYROXINE (SYNTHROID) 25 MCG TABLET    TAKE ONE & ONE-HALF TABLETS BY MOUTH DAILY BEFORE BREAKFAST.   MAGNESIUM GLYCINATE ADVANCED PO    Take 240 mg by mouth at bedtime.   MULTIPLE VITAMIN (MULTIVITAMIN) CAPSULE    Take 1 capsule by mouth daily.   VENLAFAXINE XR (EFFEXOR-XR) 75 MG 24 HR CAPSULE    TAKE 1 CAPSULE BY MOUTH DAILY WITH BREAKFAST.  Modified Medications   No medications on file  Discontinued Medications   CLINDAMYCIN (CLEOCIN) 300 MG CAPSULE    Take 300 mg by  mouth. Prior to dental work   MEMANTINE (NAMENDA) 5 MG TABLET    Take 1 tablet (5 mg total) by mouth 2 (two) times daily.    Physical Exam: Vitals:   10/18/23 0909  BP: 127/78  Pulse: 68  Resp: 18  Temp: (!) 97.4 F (36.3 C)  SpO2: 99%  Weight: 140 lb 3.2 oz (63.6 kg)  Height: 5\' 3"  (1.6 m)   Body mass index is 24.84 kg/m. BP Readings from Last 3 Encounters:  10/18/23 127/78  09/13/23 128/88  06/21/23 121/88   Wt Readings from Last 3 Encounters:  10/18/23 140 lb 3.2 oz (63.6 kg)  09/13/23 139 lb 6.4 oz (63.2 kg)  06/21/23 140 lb (63.5 kg)    Physical Exam Constitutional:      Appearance: Normal appearance.  HENT:     Head: Normocephalic and atraumatic.  Cardiovascular:     Rate and Rhythm: Normal rate and regular rhythm.  Pulmonary:     Effort: Pulmonary effort is normal. No respiratory distress.     Breath sounds: Normal breath sounds. No wheezing.  Abdominal:     General: Bowel sounds are normal. There is no distension.     Tenderness: There is no abdominal tenderness. There is no guarding or rebound.     Comments:    Musculoskeletal:        General: No swelling or tenderness.  Skin:    General: Skin is dry.  Neurological:     Mental Status: She is alert. Mental status is at baseline.     Sensory: No sensory deficit.     Motor: No weakness.     Labs reviewed: Basic Metabolic Panel: Recent Labs    09/17/23 0707  NA 139  K 4.6  CL 103  CO2 29  GLUCOSE 92  BUN 12  CREATININE 0.93  CALCIUM 9.2  TSH 2.75   Liver Function Tests: Recent Labs    09/17/23 0707  AST 22  ALT 15  BILITOT 0.5  PROT 7.0   No results for input(s): "LIPASE", "AMYLASE" in the last 8760 hours. No results for input(s): "AMMONIA" in the last 8760 hours. CBC: Recent Labs    09/17/23 0707  WBC 5.7  NEUTROABS 2,725  HGB 15.2  HCT 46.4*  MCV 96.7  PLT 291   Lipid Panel: No results for input(s): "CHOL", "HDL", "LDLCALC", "TRIG", "CHOLHDL", "LDLDIRECT" in the last  8760 hours. TSH: Recent Labs    09/17/23 0707  TSH 2.75   A1C: Lab Results  Component Value Date   HGBA1C 5.8 (H) 10/16/2022    Assessment and Plan Assessment & Plan   1. Current moderate episode of major depressive disorder without prior episode (HCC) (Primary) Phq9 improved Cont with effexor  2. GAD (generalized anxiety disorder) Improved Cont with effexor  3. Memory deficits Pt reports being forgetful  Will increase namenda to 10 mg bid  Will refer to neuropsychological testing  - memantine (NAMENDA) 10 MG tablet; Take 1 tablet (10 mg total) by mouth 2 (two) times daily.  Dispense: 180 tablet; Refill: 1 - Ambulatory referral to Neuropsychology  4. Acquired hypothyroidism Lab Results  Component Value Date   TSH 2.75 09/17/2023   Cont with levothyroxine  Other orders - MAGNESIUM GLYCINATE ADVANCED PO; Take 240 mg by mouth at bedtime.     Return in about 3 months (around 01/17/2024).:

## 2023-10-22 ENCOUNTER — Other Ambulatory Visit: Payer: Medicare HMO

## 2023-10-24 ENCOUNTER — Telehealth: Payer: Self-pay

## 2023-10-24 ENCOUNTER — Encounter: Payer: Medicare HMO | Admitting: Nurse Practitioner

## 2023-10-24 NOTE — Telephone Encounter (Signed)
 Copied from CRM [id number]. Topic: Referral - Question >> Oct 24, 2023  9:02 AM Maree Krabbe H wrote: Reason for CRM: Patient called and stated the the referral information is incorrect for the phone number [phone]. I did Google the information and he is correct it only comes back for pediatric behavioral and nothing to do with adults. The patient states that when he called the number it gave him all prompts for peds and when he spoke with someone they had no idea about adult care. Patients callback number is [phone].

## 2023-11-07 ENCOUNTER — Telehealth: Payer: Self-pay | Admitting: Sports Medicine

## 2023-11-07 NOTE — Telephone Encounter (Signed)
 Carla please review.    Copied from CRM [id number]. Topic: General - Other >> Nov 07, 2023  2:17 PM Adrianna P wrote: Reason for CRM: [facility] called and they  are not taking outside referrals for neuro cognitive testing at this time

## 2023-11-08 ENCOUNTER — Telehealth: Payer: Self-pay

## 2023-11-08 NOTE — Telephone Encounter (Signed)
 Called and spoke to patient. I let her know that I am waiting on a return call from 2 neuropsychology groups and will call her back to let her know which group will be able to see her and provide the services that she needs. Patient was agreeable and verbalized understanding that I will keep her updated.

## 2023-11-08 NOTE — Telephone Encounter (Signed)
 This encounter was created in error - please disregard.

## 2023-11-08 NOTE — Telephone Encounter (Signed)
 Copied from CRM [id number]. Topic: Referral - Question >> Oct 24, 2023  9:02 AM [name] wrote: Reason for CRM: Patient called and stated the the referral information is incorrect for the phone number [phone]. I did Google the information and he is correct it only comes back for pediatric behavioral and nothing to do with adults. The patient states that when he called the number it gave him all prompts for peds and when he spoke with someone they had no idea about adult care. Patients callback number is [phone]. >> Nov 08, 2023  2:37 PM Tiffany H wrote: [name] with [facility] [place] called to advise that practice does not bill insurance. All appointments are self-pay - reimbursements must be taken care of by patient. Advised that she'll keep the referral until next week.   Message sent to Kristine Gall, MD

## 2023-11-26 DIAGNOSIS — L821 Other seborrheic keratosis: Secondary | ICD-10-CM | POA: Diagnosis not present

## 2023-11-26 DIAGNOSIS — L719 Rosacea, unspecified: Secondary | ICD-10-CM | POA: Diagnosis not present

## 2023-11-26 DIAGNOSIS — L814 Other melanin hyperpigmentation: Secondary | ICD-10-CM | POA: Diagnosis not present

## 2023-11-26 DIAGNOSIS — D1801 Hemangioma of skin and subcutaneous tissue: Secondary | ICD-10-CM | POA: Diagnosis not present

## 2023-11-29 ENCOUNTER — Ambulatory Visit: Attending: Cardiology | Admitting: Cardiology

## 2023-11-29 ENCOUNTER — Encounter: Payer: Self-pay | Admitting: Cardiology

## 2023-11-29 VITALS — BP 136/84 | HR 60 | Ht 63.0 in | Wt 142.0 lb

## 2023-11-29 DIAGNOSIS — I471 Supraventricular tachycardia, unspecified: Secondary | ICD-10-CM

## 2023-11-29 DIAGNOSIS — E785 Hyperlipidemia, unspecified: Secondary | ICD-10-CM | POA: Diagnosis not present

## 2023-11-29 DIAGNOSIS — R0602 Shortness of breath: Secondary | ICD-10-CM | POA: Diagnosis not present

## 2023-11-29 DIAGNOSIS — Z9889 Other specified postprocedural states: Secondary | ICD-10-CM

## 2023-11-29 NOTE — Progress Notes (Signed)
 Cardiology Office Note:  .   Date:  11/30/2023  ID:  Kristine [name], DOB March 03, 1941, MRN [id number] PCP: Tye Gall, MD  [facility] Providers Cardiologist:  Randene Bustard, MD     Chief Complaint  Patient presents with   New Patient (Initial Visit)    Establish cardiology care.  No major issues.   Cardiac Valve Problem    S/p MVR 2018 -stable by echo    Patient Profile: .     Kristine [name] is a 83 y.o. 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  She was referred by [facility] for evaluation of heart rhythm concerns based on a verbal report of a 2-week monitor in 2024 showing runs of "SVT "(result snot available); although she also has had a history of Mitral Valve Repair.  They ordered a 2D echocardiogram and referred her to establish cardiology care as they have moved to the [place] area from [facility] and she and her husband have asked to be established in the [facility] system.  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  Normal LV function and wall motion.  Posterior mitral leaflet prolapse with anterior directed moderate to severe MR.  Mild LA dilation. => TEE suggested severe MR and symptoms progressed to exertional dyspnea. => Referred for MVR Minimal invasive MVR-Right Thoracotomy 05/27/2017 (Dr. Rawland Caddy) Hypothyroidism on levothyroxine     Kristine [name] previously followed by [facility]-Dr. Nydia Belfast.  Unfortunately Dr. Nydia Belfast changed locations and she was then seen by Dr. Amey Bal prior to she and her [place] from [facility].  They are now living at [facility] retirement  community.  Subjective  Discussed the use of AI scribe software for clinical note transcription with the patient, who gave verbal consent to proceed.  History of Present Illness Kristine [name] is an 83 year old female with supraventricular tachycardia who presents for evaluation of heart rhythm concerns.   Last year, she underwent a two-week heart monitor study, which revealed runs of supraventricular tachycardia (SVT). She does not recall experiencing fast heart rates but feels her heart beats slower than normal. No heart racing, skipping, or dizziness. Her current medication includes clindamycin 300 mg, taken two tablets 30 to 60 minutes prior to dental or GI procedures. She discontinued pravastatin  due to concerns about memory loss.  In 2018, she had mitral valve repair surgery via a thoroscopic procedure. She occasionally experiences shortness of breath, particularly when walking uphill for 12 to 15 minutes, necessitating a pause to catch her breath. She also reports low energy levels for a day or two, sometimes associated with social interactions, but not consistently linked to any specific activity. No chest pain, tightness, heaviness, or pressure during physical activity. She sleeps on one pillow and does not wake up due to breathing difficulties. No leg swelling.    Objective   Meds Taking: Fosamax  70 mg weekly, Synthroid 25 mg tablet takes 37.5 mg daily, magnesium  glycinate 240 mg nightly, Namenda  10 mg twice daily, Effexor  75 mg daily, multivitamin and vitamin C.  She and her husband recently relocated from [facility] to [facility]  Studies Reviewed: .  EKG Interpretation Date/Time:  Friday Nov 29 2023 14:43:24 EDT Ventricular Rate:  60 PR Interval:  170 QRS Duration:  72 QT Interval:  438 QTC Calculation: 438 R Axis:   22  Text Interpretation: Normal sinus rhythm Possible Left atrial enlargement Nonspecific T wave abnormality No previous ECGs available  Confirmed by Randene Bustard ([id number]) on 11/29/2023 3:20:34 PM    Lab Results  Component Value Date   NA 139 09/17/2023   K 4.6 09/17/2023   CREATININE 0.93 09/17/2023   EGFR 62 09/17/2023   GLUCOSE 92 09/17/2023   Lab Results  Component Value Date   CHOL 172 10/16/2022   HDL 56 10/16/2022   LDLCALC 89 10/16/2022   TRIG 171 (H) 10/16/2022   CHOLHDL 3.1 10/16/2022    ECHO 09/26/2023 : LVEF 60 to 65%.  No RWMA.  Paradoxical septal motion consistent with RV volume overload versus postop state.  RV size is normal but unable to assess PAP.  Mitral valve has been repaired.  Ring in place.  Mean mitral gradient 3 mm..  Normal.  Mild aortic valve calcification.  RAP estimated 3 mmHg.  [facility] [phone] [facility]) 07/2022: Normal LV size and function with EF of 60 to 65%.  No RWMA.  Normal RV size and function.  S/p MVR with angioplasty ring trace MR and mild MS.  Mean gradient 3.6 mmHg.  No change from prior study MONITOR 2024: Per report-2-week event monitor showed bursts of "SVT "  Preop CATH 04/19/2027: Right dominant system.  20% proximal LAD-calcified.  From ostial RCA.  Otherwise no notable CAD  Risk Assessment/Calculations:           Physical Exam:   VS:  BP 136/84 (BP Location: Left Arm, Patient Position: Sitting, Cuff Size: Normal)   Pulse 60   Ht 5\' 3"  (1.6 m)   Wt 142 lb (64.4 kg)   SpO2 96%   BMI 25.15 kg/m    Wt Readings from Last 3 Encounters:  11/29/23 142 lb (64.4 kg)  10/18/23 140 lb 3.2 oz (63.6 kg)  09/13/23 139 lb 6.4 oz (63.2 kg)    GEN: Well nourished, well groomed in no acute distress; relatively healthy appearing, but does appear to have some issues with memory loss.  Her husband tends to answer questions for her son. NECK: No JVD; No carotid bruits CARDIAC: Normal S1, S2; RRR, no murmurs, rubs, gallops RESPIRATORY:  Clear to auscultation without rales, wheezing or rhonchi ; nonlabored, good air movement. ABDOMEN: Soft,  non-tender, non-distended EXTREMITIES:  No edema; No deformity      ASSESSMENT AND PLAN: .    Problem List Items Addressed This Visit       Cardiology Problems   Hyperlipidemia (Chronic)   Last labs from April 2024 showed LDL of 89.  Minimal CAD on cath.  Apparently pravastatin  was discontinued likely because of memory issues.      Paroxysmal SVT (supraventricular tachycardia) (HCC)   Unfortunately, I do not have the monitor review, but it seems like she did not have prolonged episodes.  Likely short bursts of but are more likely PAT. Is possible for atrial flutter or fib to be related to her surgery but not necessarily true SVT.  Is possible that there could be some reentrant tachycardias related to her surgery however. As she is not having any significant episodes, would prefer to avoid treatment especially with history of bradycardia. - Monitor for symptoms: heart racing, dizziness, irregular heartbeats. - Instructed on  vagal maneuvers for symptom management. - No medication unless symptomatic.      Relevant Orders   EKG 12-Lead (Completed)     Other   History of mitral valve repair - Primary (Chronic)   Mitral valve repair in 2017 with good function on recent echocardiograms (last one was in March of this year).  No longer having any heart failure symptoms or significant dyspnea. Repair may contribute to atrial tachycardia due to scar tissue. - Routine echocardiograms every couple of years. - Clindamycin 300 mg, two tablets 30-60 minutes before dental or GI procedures.      Shortness of breath   Occasional exertional shortness of breath, possibly mild diastolic dysfunction post-surgery. Echocardiogram shows good heart function, no significant cardiac cause identified. - Report if shortness of breath increases in frequency or severity.              Follow-Up: Return in about 6 months (around 05/31/2024). Establishing care with cardiologist, monitoring for symptom or  cardiac function changes. - Follow-up in six months to reassess symptoms and review new data. - Send reminder three months prior to appointment.  I spent 68 minutes in the care of [facility] today including reviewing labs (1 minute), reviewing studies (preop cath, surgery MVR with both preop and postop echo/TEE's reviewed and the most recent 2 echoes reviewed [facility]), reviewing outside studies (15 minutes), face to face time discussing treatment options (28 minutes), reviewing records from multiple different cardiology visits from Dr. Nydia Belfast but also Dr.Rhorbeck along with PCP note (12 minutes), 13 minutes dictating, and documenting in the encounter.     Signed, Arleen Lacer, MD, MS Randene Bustard, M.D., M.S. Interventional Chartered certified accountant  Pager # [phone]

## 2023-11-29 NOTE — Patient Instructions (Signed)
Medication Instructions:   No changes *If you need a refill on your cardiac medications before your next appointment, please call your pharmacy*   Lab Work:  Not needed   Testing/Procedures: Not needed   Follow-Up: At [facility], you and your health needs are our priority.  As part of our continuing mission to provide you with exceptional heart care, we have created designated Provider Care Teams.  These Care Teams include your primary Cardiologist (physician) and Advanced Practice Providers (APPs -  Physician Assistants and Nurse Practitioners) who all work together to provide you with the care you need, when you need it.     Your next appointment:   6 month(s)  The format for your next appointment:   In Person  Provider:   David Harding, MD    

## 2023-11-30 ENCOUNTER — Encounter: Payer: Self-pay | Admitting: Cardiology

## 2023-11-30 DIAGNOSIS — R0602 Shortness of breath: Secondary | ICD-10-CM | POA: Insufficient documentation

## 2023-11-30 DIAGNOSIS — I471 Supraventricular tachycardia, unspecified: Secondary | ICD-10-CM | POA: Insufficient documentation

## 2023-11-30 NOTE — Assessment & Plan Note (Signed)
 Unfortunately, I do not have the monitor review, but it seems like she did not have prolonged episodes.  Likely short bursts of but are more likely PAT. Is possible for atrial flutter or fib to be related to her surgery but not necessarily true SVT.  Is possible that there could be some reentrant tachycardias related to her surgery however. As she is not having any significant episodes, would prefer to avoid treatment especially with history of bradycardia. - Monitor for symptoms: heart racing, dizziness, irregular heartbeats. - Instructed on vagal maneuvers for symptom management. - No medication unless symptomatic.

## 2023-11-30 NOTE — Assessment & Plan Note (Signed)
 Occasional exertional shortness of breath, possibly mild diastolic dysfunction post-surgery. Echocardiogram shows good heart function, no significant cardiac cause identified. - Report if shortness of breath increases in frequency or severity.

## 2023-11-30 NOTE — Assessment & Plan Note (Signed)
 Mitral valve repair in 2017 with good function on recent echocardiograms (last one was in March of this year).  No longer having any heart failure symptoms or significant dyspnea. Repair may contribute to atrial tachycardia due to scar tissue. - Routine echocardiograms every couple of years. - Clindamycin 300 mg, two tablets 30-60 minutes before dental or GI procedures.

## 2023-11-30 NOTE — Assessment & Plan Note (Signed)
 Last labs from April 2024 showed LDL of 89.  Minimal CAD on cath.  Apparently pravastatin  was discontinued likely because of memory issues.

## 2023-12-15 ENCOUNTER — Other Ambulatory Visit: Payer: Self-pay | Admitting: Sports Medicine

## 2023-12-15 DIAGNOSIS — R413 Other amnesia: Secondary | ICD-10-CM

## 2023-12-31 DIAGNOSIS — F32A Depression, unspecified: Secondary | ICD-10-CM | POA: Diagnosis not present

## 2024-01-07 ENCOUNTER — Telehealth: Payer: Self-pay | Admitting: *Deleted

## 2024-01-07 NOTE — Telephone Encounter (Signed)
 Copied from CRM [id number]. Topic: Clinical - Medication Question >> Jan 06, 2024  5:10 PM Adrianna P wrote: Reason for RMF:Kristine [name] is taking venlafaxine  XR (EFFEXOR -XR) 75 MG 24 hr capsule and she is also taking another anti depressant and wants to know if both can be taken or just one, please call [phone]

## 2024-01-07 NOTE — Telephone Encounter (Signed)
 Dr. Delynn (Psych.) prescribed.   Bupropion HCL XL 150mg  once daily.  Patient stated that you had referred her to him.   Patient is currently taking both and she is wanting to know if she should continue and if so, if she can take them (Bupropion and Effexor ) together.    Please Advise.

## 2024-01-07 NOTE — Telephone Encounter (Signed)
 Spoke with patient and her husband and they stated that they are NOT going back to Dr. Delynn.   Husband stated that they are going to try the Bupropion that was prescribed and STOP the Effexor . Stated that they are NOT going to take both at the same time.   Husband stated that Dr. Delynn told them that the Bupropion would help with giving patient alittle more energy during the day.    Do you want me to update the Medication list with this information. D/C Effexor  and add the Bupropion? Please Advise.

## 2024-01-07 NOTE — Telephone Encounter (Signed)
 As per our med list  pt is only on effexor  for depression.

## 2024-01-07 NOTE — Telephone Encounter (Signed)
 If she seen a psychiatrist, she needs to check with them if its ok taking both

## 2024-01-13 DIAGNOSIS — F32A Depression, unspecified: Secondary | ICD-10-CM | POA: Diagnosis not present

## 2024-01-15 DIAGNOSIS — S301XXA Contusion of abdominal wall, initial encounter: Secondary | ICD-10-CM | POA: Diagnosis not present

## 2024-01-15 DIAGNOSIS — W19XXXA Unspecified fall, initial encounter: Secondary | ICD-10-CM | POA: Diagnosis not present

## 2024-01-15 DIAGNOSIS — R0789 Other chest pain: Secondary | ICD-10-CM | POA: Diagnosis not present

## 2024-01-15 DIAGNOSIS — R319 Hematuria, unspecified: Secondary | ICD-10-CM | POA: Diagnosis not present

## 2024-01-15 NOTE — Progress Notes (Signed)
 Subjective: Patient ID:  Kristine [name] is a 83 y.o. female  Chief Complaint  Patient presents with  . Fall    Fell Sunday, right flank bruising. States she when she fell she hit a chair on her side. Fell in the kitchen on padded floor.     The following information was reviewed by members of the visit team:  Tobacco  Allergies  Meds  Problems  Med Hx  Surg Hx  OB Status   Fam Hx    83 year old female presents for evaluation of symptoms ongoing since Sunday.  She and her family report she was on a stepstool Sunday, and missed the last step falling backwards and striking the right mid back.  They deny any head injury or loc.  She does have some bruising over the right flank area along with TTP of the right posterior lower ribs.  No crepitus. She denies any other injuries and does not appear to be in distress on exam  Fall Pertinent negatives include no abdominal pain, fever, headaches, nausea or vomiting.     Review of Systems  Constitutional:  Negative for chills and fever.  Respiratory:  Negative for shortness of breath.   Cardiovascular:  Negative for chest pain.  Gastrointestinal:  Negative for abdominal pain, diarrhea, nausea and vomiting.  Genitourinary:  Negative for dysuria.  Skin:  Positive for color change.  Neurological:  Negative for dizziness and headaches.      Objective  Vitals:   01/15/24 1507 01/15/24 1545  BP: (!) 171/97 147/90  BP Location: Left arm Left arm  Patient Position: Sitting Sitting  Pulse: 72 72  Resp: 16   Temp: 97.5 F (36.4 C)   TempSrc: Oral   SpO2: 99% 98%    No LMP recorded. Patient is postmenopausal.     Physical Exam Vitals and nursing note reviewed.  Constitutional:      Appearance: Normal appearance. She is normal weight.  HENT:     Head: Normocephalic and atraumatic.   Cardiovascular:     Rate and Rhythm: Normal rate and regular rhythm.     Heart sounds: Normal heart sounds.  Pulmonary:     Effort:  Pulmonary effort is normal.     Breath sounds: Normal breath sounds.  Abdominal:     General: Bowel sounds are normal.     Palpations: Abdomen is soft.     Tenderness: There is no abdominal tenderness. There is right CVA tenderness. There is no left CVA tenderness.   Skin:    General: Skin is warm and dry.     Capillary Refill: Capillary refill takes 2 to 3 seconds.     Findings: Bruising present.         Comments: Bruising TTP   Neurological:     General: No focal deficit present.     Mental Status: She is alert. Mental status is at baseline.   Psychiatric:        Mood and Affect: Mood normal.        Behavior: Behavior normal.      Recent Results (from the past 24 hours)  POC Urinalysis Auto without Microscopic   Collection Time: 01/15/24  4:12 PM  Result Value Ref Range   Color, Urine Dark Yellow (A) Yellow   Clarity, Urine Clear Clear   Glucose, Urine Negative Negative mg/dL   Bilirubin, Urine Negative Negative   Ketones, Urine 15 (A) Negative mg/dL   Specific Gravity, Urine 1.025 1.010, 1.015, 1.020, 1.025  Blood, Urine Small (A) Negative   pH, Urine 6.0 5.0, 5.5, 6.0, 6.5, 7.0, 7.5, 8.0   Protein, Urine 100 (A) Negative mg/dL   Urobilinogen, Urine 0.2 <2.0 mg/dL   Nitrite, Urine Negative Negative   Leukocyte Esterase, Urine Negative Negative   Kit/Device Lot # [id number]    Kit/Device Expiration Date 01/12/2025   POCT Metabolic Panel, Comprehensive   Collection Time: 01/15/24  4:40 PM  Result Value Ref Range   Sodium, POC 139 128 - 145 mmol/L   Potassium, POC 5.0 3.6 - 5.1 mmol/L   Total CO2, POC 25 18 - 33 mmol/L   Chloride, POC 104 98 - 108 mmol/L   Glucose, POC 107 73 - 118 mg/dL   Calcium, POC 89.9 8 - 10.3 mg/dL   BUN, POC 17 7 - 22 mg/dL   Creatinine, POC 1.1 0.6 - 1.2 mg/dL   Alkaline Phosphatase, POC 77 42 - 141 U/L   Alanine Aminotransferase, POC 16 10 - 47 U/L   Aspartate Aminotransferase, POC 39 (A) 11 - 38 U/L   Total Bilirubin, POC 0.8 0.2 - 1.6  mg/dL   Albumin, POC 4.1 3.3 - 5.5 g/dL   Total Protein, POC 7.7 6.4 - 8.1 g/dL   eGFR, POC 51 (A) >=40 mL/min/1.81m2   Kit/Device Lot # [id number]    Kit/Device Expiration Date 08/01/2024      Radiologist interpretation:   XR Ribs 2 Views Right With Chest Anteroposterior  Final Result by Debby Catarina Dibble, MD (07/02 1534)  XR RIBS 2 VIEWS RIGHT WITH CHEST ANTEROPOSTERIOR, 01/15/2024 3:30 PM    INDICATION: ttp right posterior lower ribs, bruising present, Unspecified   fall, initial encounter \ W19.XXXA Unspecified fall, initial encounter \   T14.8XXA Other injury of unspecified body region, initial encounter   ttp right posterior lower ribs, bruising present  COMPARISON: May 30, 2017    FINDINGS:     .  Supportive devices: None  .  Cardiovascular: Mild enlargement cardiac silhouette. Changes of prior   cardiac valve repair.  .  Mediastinum: Within normal limits.  .  Lungs/pleura: Mild linear atelectasis in the right midlung and lung   bases. No consolidation or frank pulmonary edema. No notable pleural   fluid. No pneumothorax.  SABRA  Upper abdomen: Visualized portions are unremarkable.  .  Osseous structures: No evidence of rib fracture or lesion.      IMPRESSION:  There is no evidence of acute cardiac or pulmonary abnormality.  No displaced acute rib fractures or pneumothorax.       Assessment/Plan   Kristine [name] was seen today for fall.  Diagnoses and all orders for this visit:  Fall, initial encounter -     XR Ribs 2 Views Right With Chest Anteroposterior -     POCT Metabolic Panel, Comprehensive -     CT Abdomen Pelvis W Contrast; Future  Bruising -     POC Urinalysis Auto without Microscopic -     XR Ribs 2 Views Right With Chest Anteroposterior -     POCT Metabolic Panel, Comprehensive -     CT Abdomen Pelvis W Contrast; Future  Hematuria, unspecified type -     CT Abdomen Pelvis W Contrast; Future    Patient has been instructed on RX/ OTC medications,  dosages, side effects, and possible interactions as associated with each diagnosis in my impression and plan above.  2.   Patient education (verbal/handout) given on diagnosis, pathophysiology, treatment of diagnosis, side effects of medication use  for treatment, restrictions while taking medication.  Supportive       Care measures as directed on AVS.  Red Flags associated with diagnosis/es were reviewed and patient instructed on action plan if red flags develop.  3.   Urgent Care Disposition:  Follow up with PCP       They have been instructed that if symptoms worse that should go to Urgent Care, go to the nearest ED, or activate EMS.  4.   Patient agreed with plan and voiced understanding.  NO barriers to adherence perceived by myself.  Electronically signed: Rosaline Jama Collet FNP  Wed 01/15/2024 5:25 PM

## 2024-01-21 NOTE — Progress Notes (Signed)
 I reached out to patient and confirmed her date of birth.  I spoke to patient's husband, who is her primary caretaker.  We discussed CT abdomen pelvis results, including incidental pulmonary nodule that was identified.  Patient has a follow-up appointment with her primary care provider on Friday morning and anticipates talking to primary care provider about possible chest CT as recommended by radiology.

## 2024-01-24 ENCOUNTER — Encounter: Payer: Self-pay | Admitting: Sports Medicine

## 2024-01-24 ENCOUNTER — Ambulatory Visit (INDEPENDENT_AMBULATORY_CARE_PROVIDER_SITE_OTHER): Admitting: Sports Medicine

## 2024-01-24 VITALS — BP 112/76 | HR 81 | Temp 96.2°F | Ht 63.0 in | Wt 137.0 lb

## 2024-01-24 DIAGNOSIS — K5900 Constipation, unspecified: Secondary | ICD-10-CM | POA: Diagnosis not present

## 2024-01-24 DIAGNOSIS — F321 Major depressive disorder, single episode, moderate: Secondary | ICD-10-CM

## 2024-01-24 DIAGNOSIS — F411 Generalized anxiety disorder: Secondary | ICD-10-CM | POA: Diagnosis not present

## 2024-01-24 DIAGNOSIS — R413 Other amnesia: Secondary | ICD-10-CM

## 2024-01-24 DIAGNOSIS — R911 Solitary pulmonary nodule: Secondary | ICD-10-CM

## 2024-01-24 MED ORDER — DOCUSATE SODIUM 100 MG PO CAPS: 100.0000 mg | ORAL_CAPSULE | Freq: Two times a day (BID) | ORAL | 0 refills | Status: DC

## 2024-01-24 MED ORDER — ESCITALOPRAM OXALATE 5 MG PO TABS: 5.0000 mg | ORAL_TABLET | Freq: Every day | ORAL | 0 refills | Status: DC

## 2024-01-24 NOTE — Progress Notes (Signed)
 Careteam: Patient Care Team: Sherlynn Madden, MD as PCP - General (Internal Medicine) Anner Alm ORN, MD as PCP - Cardiology (Cardiology)  PLACE OF SERVICE:  [facility]  Advanced Directive information                                  [facility]    Allergies  Allergen Reactions   Penicillin G Benzathine & Proc Other (See Comments)    Unknown.    Chief Complaint  Patient presents with   Medical Management of Chronic Issues    3 month follow up // concerns needing to talk with a neurologist regarding memory issues , patient also fell and wants to discuss some medication.      Discussed the use of AI scribe software for clinical note transcription with the patient, who gave verbal consent to proceed.  History of Present Illness    Kristine [name] is an 83 year old female who presents with memory concerns and a recent fall.  She experienced a fall from a three-step kitchen stool, missing the bottom step and impacting her right side above the waistline on a wooden chair. Imaging at urgent care showed no fractures, but a CT scan revealed a small spot on her lung. She has no cough, shortness of breath, fever, night sweats. She reports a decreased appetite and feels full quickly.  She has been experiencing memory issues, particularly with short-term memory, forgetting conversations within thirty seconds to a minute. She remains able to drive and perform daily activities, but there is a noticeable decline in her short-term memory. She occasionally struggles to find words but has no hallucinations. She is currently taking Namenda  10 mg twice a day for memory issues.  She has a history of depression and anxiety, previously managed with venlafaxine  and bupropion. She experienced increased energy with bupropion but also 'brain fog', leading to discontinuation. She is not currently taking either medication.  She reports constipation, having bowel movements approximately once  every three days. She does not currently take any stool softeners but has used enemas occasionally. She maintains a diet with fibrous foods and tries to stay active, participating in chair yoga three times a week and walking when she feels better.  She has been taking ibuprofen daily for several months, though she does not recall why or who recommended it. She denies any current pain that would necessitate its use. Instructed patient to stop taking IBU.  Review of Systems:  Review of Systems  Respiratory:  Negative for cough and shortness of breath.   Psychiatric/Behavioral:  Positive for memory loss. The patient is nervous/anxious.    Negative unless indicated in HPI.   Past Medical History:  Diagnosis Date   SVT (short bursts of supraventricular tachycardia) (HCC)    By report, short bursts of SVT on monitor.  Not sustained.   Anxiety and depression    DDD (degenerative disc disease), lumbar    Fatigue    GAD (generalized anxiety disorder)    History of mitral valve prolapse    Diagnosed 2018 with severe MR from MVP-referred for MVR   History of repair of mitral valve 05/2017   [facility]. [facility]: Severe MR with [facility]   Hypertensive kidney disease    Hypothyroid    Vitamin D  deficiency    Past Surgical History:  Procedure Laterality Date   ABDOMINAL HYSTERECTOMY  1974   Dr. Myrtie,  [facility] [place]   LEFT HEART CATH AND CORONARY ANGIOGRAPHY  04/18/2017   [facility]; Dr. Isidor -> preop for MVR: Right dominant system.  20% proximal LAD-calcified.  From ostial RCA.  Otherwise no notable CAD   MITRAL VALVE REPAIR  2018   Dr. Burnie, [facility]   TEE WITHOUT CARDIOVERSION  02/2017   [facility]: MR noted to be severe   TONSILLECTOMY  1955   [facility]   TRANSTHORACIC ECHOCARDIOGRAM  12/2016   [facility]: EF 55%.  Normal LV function and wall motion.   Posterior mitral leaflet prolapse with anterior directed moderate to severe MR.  Mild LA dilation.   TRANSTHORACIC ECHOCARDIOGRAM  07/2022   [facility]: Normal LV size and function with EF of 60 to 65%.  No RWMA.  Normal RV size and function.  S/p MVR with angioplasty ring trace MR and mild MS.  Mean gradient 3.6 mmHg.  No change from prior study   TRANSTHORACIC ECHOCARDIOGRAM  09/26/2023   [facility]: LVEF 60 to 65%.  No RWMA.  Paradoxical septal motion consistent with RV volume overload versus postop state.  RV size is normal but unable to assess PAP.  Mitral valve has been repaired.  Ring in place.  Mean mitral gradient 3 mm..  Normal.  Mild aortic valve calcification.  RAP estimated 3 mmHg.   Social History:   reports that she has never smoked. She has never used smokeless tobacco. She reports current alcohol use. She reports that she does not use drugs.  Family History  Problem Relation Age of Onset   Parkinson's disease Mother    Dementia Mother    Prostate cancer Father    Stroke Sister    High Cholesterol Sister    Anxiety disorder Brother    Depression Brother    Panic disorder Brother    Dementia Maternal Grandfather    Parkinson's disease Maternal Grandfather     Medications: Patient's Medications  New Prescriptions   No medications on file  Previous Medications   ALENDRONATE  (FOSAMAX ) 70 MG TABLET    Take 1 tablet (70 mg total) by mouth every 7 (seven) days. Take with a full glass of water on an empty stomach.   LEVOTHYROXINE (SYNTHROID) 25 MCG TABLET    TAKE ONE & ONE-HALF TABLETS BY MOUTH DAILY BEFORE BREAKFAST.   MAGNESIUM  GLYCINATE ADVANCED PO    Take 240 mg by mouth at bedtime.   MEMANTINE  (NAMENDA ) 10 MG TABLET    Take 1 tablet (10 mg total) by mouth 2 (two) times daily.   MULTIPLE VITAMIN (MULTIVITAMIN) CAPSULE    Take 1 capsule by mouth daily.   VENLAFAXINE  XR (EFFEXOR -XR) 75 MG 24 HR CAPSULE    TAKE 1 CAPSULE BY MOUTH  DAILY WITH BREAKFAST.  Modified Medications   No medications on file  Discontinued Medications   No medications on file    Physical Exam: Vitals:   01/24/24 0902  BP: 112/76  Temp: (!) 96.2 F (35.7 C)   There is no height or weight on file to calculate BMI. BP Readings from Last 3 Encounters:  01/24/24 112/76  11/29/23 136/84  10/18/23 127/78   Wt Readings from Last 3 Encounters:  11/29/23 142 lb (64.4 kg)  10/18/23 140 lb 3.2 oz (63.6 kg)  09/13/23 139 lb 6.4 oz (63.2 kg)    Physical Exam Constitutional:      Appearance: Normal  appearance.  HENT:     Head: Normocephalic and atraumatic.  Cardiovascular:     Rate and Rhythm: Normal rate and regular rhythm.  Pulmonary:     Effort: Pulmonary effort is normal. No respiratory distress.     Breath sounds: Normal breath sounds. No wheezing.  Abdominal:     General: Bowel sounds are normal. There is no distension.     Tenderness: There is no abdominal tenderness. There is no guarding or rebound.     Comments:    Musculoskeletal:        General: No swelling.  Neurological:     Mental Status: She is alert. Mental status is at baseline.     Motor: No weakness.     Labs reviewed: Basic Metabolic Panel: Recent Labs    09/17/23 0707  NA 139  K 4.6  CL 103  CO2 29  GLUCOSE 92  BUN 12  CREATININE 0.93  CALCIUM 9.2  TSH 2.75   Liver Function Tests: Recent Labs    09/17/23 0707  AST 22  ALT 15  BILITOT 0.5  PROT 7.0   No results for input(s): LIPASE, AMYLASE in the last 8760 hours. No results for input(s): AMMONIA in the last 8760 hours. CBC: Recent Labs    09/17/23 0707  WBC 5.7  NEUTROABS 2,725  HGB 15.2  HCT 46.4*  MCV 96.7  PLT 291   Lipid Panel: No results for input(s): CHOL, HDL, LDLCALC, TRIG, CHOLHDL, LDLDIRECT in the last 8760 hours. TSH: Recent Labs    09/17/23 0707  TSH 2.75   A1C: Lab Results  Component Value Date   HGBA1C 5.8 (H) 10/16/2022    Assessment  and Plan Assessment & Plan   1. Memory deficits (Primary) Notable decline in short-term memory with occasional word-finding difficulties. No hallucinations or significant mood changes. - Continue Namenda  10 mg twice daily. - Discussed potential benefits and risks of additional medications like Aricept, which may cause diarrhea and bradycardia. Increase physical activity and cognitively engaging activities - Basic Metabolic Panel  2. Current moderate episode of major depressive disorder without prior episode (HCC) Will start low dose lexapro  Follow up in 4 weeks  - escitalopram  (LEXAPRO ) 5 MG tablet; Take 1 tablet (5 mg total) by mouth daily.  Dispense: 30 tablet; Refill: 0  3. GAD (generalized anxiety disorder) Will start lexapro  Follow up in 4 weeks   4. Constipation, unspecified constipation type Start Colace twice daily. - Encourage increased water intake and physical activity. - Advise on a diet rich in fiber. - docusate sodium  (COLACE) 100 MG capsule; Take 1 capsule (100 mg total) by mouth 2 (two) times daily.  Dispense: 10 capsule; Refill: 0    Lung nodule  Lower Chest: Mild cardiac enlargement. Mitral annular calcification. Noncalcified nodule left lower lobe abutting the pericardium measuring 11 mm in diameter new from prior. (8/34).  - Plan to repeat CT scan in one year to monitor the spot. - Instruct to monitor for symptoms such as weight loss, cough, shortness of breath, or night sweats.

## 2024-01-24 NOTE — Patient Instructions (Signed)
 Please come to clinic for lab work on Tuesday at 7.30 am

## 2024-02-12 ENCOUNTER — Encounter: Payer: Self-pay | Admitting: Sports Medicine

## 2024-02-13 ENCOUNTER — Ambulatory Visit: Payer: Self-pay | Admitting: Sports Medicine

## 2024-02-13 NOTE — Telephone Encounter (Addendum)
 FYI Only or Action Required?: Action required by provider: call patient.  Patient was last seen in primary care on 01/24/2024 by Sherlynn Madden, MD.  Called Nurse Triage reporting Pruritis.  Symptoms began a week ago.  Interventions attempted: OTC medications: Benadryl.  Symptoms are: gradually worsening.  Triage Disposition: See PCP When Office is Open (Within 3 Days)  Patient/caregiver understands and will follow disposition?: No, wishes to speak with PCP         Copied from CRM [id number]. Topic: Clinical - Red Word Triage >> Feb 13, 2024  4:44 PM Mercer PEDLAR wrote: Red Word that prompted transfer to Nurse Triage: Patient's husband, Norleen, is calling regarding reaction patient is having to escitalopram  (LEXAPRO ) 5 MG tablet. Reason for Disposition  [1] MODERATE-SEVERE local itching (i.e., interferes with work, school, activities) AND [2] not improved after 24 hours of hydrocortisone  cream  Answer Assessment - Initial Assessment Questions Patient requests a call back from PCP at [phone] to discuss itching and possibility that it is from new medication started two weeks ago (lexapro ). This RN educated pt on home care, new-worsening symptoms, when to call back/seek emergent care. Pt verbalized understanding and agrees to plan.   Itching on chest, face, back of neck Denies rash, difficulty breating Started about 4-5 days ago Pt thinks its worse today Intermittent itching, today is worse   CAUSE: What do you think is causing the itching?      Started lexapro  5 mg bid two weeks ago  Protocols used: Itching - Localized-A-AH

## 2024-02-13 NOTE — Telephone Encounter (Signed)
 Mychart message sent to patient.

## 2024-02-14 NOTE — Telephone Encounter (Signed)
Message sent to covering provider

## 2024-02-16 ENCOUNTER — Other Ambulatory Visit: Payer: Self-pay | Admitting: Sports Medicine

## 2024-02-16 DIAGNOSIS — F321 Major depressive disorder, single episode, moderate: Secondary | ICD-10-CM

## 2024-02-17 ENCOUNTER — Ambulatory Visit (INDEPENDENT_AMBULATORY_CARE_PROVIDER_SITE_OTHER): Admitting: Adult Health

## 2024-02-17 VITALS — BP 117/68 | HR 64 | Temp 97.1°F | Resp 18 | Ht 63.0 in | Wt 137.9 lb

## 2024-02-17 DIAGNOSIS — L299 Pruritus, unspecified: Secondary | ICD-10-CM | POA: Diagnosis not present

## 2024-02-17 DIAGNOSIS — E039 Hypothyroidism, unspecified: Secondary | ICD-10-CM | POA: Diagnosis not present

## 2024-02-17 DIAGNOSIS — R413 Other amnesia: Secondary | ICD-10-CM | POA: Diagnosis not present

## 2024-02-17 DIAGNOSIS — F411 Generalized anxiety disorder: Secondary | ICD-10-CM | POA: Diagnosis not present

## 2024-02-17 DIAGNOSIS — M810 Age-related osteoporosis without current pathological fracture: Secondary | ICD-10-CM | POA: Diagnosis not present

## 2024-02-17 MED ORDER — BUPROPION HCL 75 MG PO TABS: 37.5000 mg | ORAL_TABLET | Freq: Every day | ORAL | Status: DC

## 2024-02-17 MED ORDER — BUPROPION HCL 75 MG PO TABS: 37.5000 mg | ORAL_TABLET | Freq: Two times a day (BID) | ORAL | Status: DC

## 2024-02-17 NOTE — Progress Notes (Signed)
 [facility]  Provider:  Jereld Serum DNP  Code Status:  Full Code  Goals of Care:     09/13/2023    9:16 AM  Advanced Directives  Does Patient Have a Medical Advance Directive? No  Would patient like information on creating a medical advance directive? No - Patient declined     Chief Complaint  Patient presents with   Medication Management    medication issues adverse reaction need to change     Discussed the use of AI scribe software for clinical note transcription with the patient, who gave verbal consent to proceed.  HPI: Patient is a 83 y.o. female seen today for an acute visit for medication management. She is accompanied by her husband.  She began experiencing intense itching primarily on her face approximately two weeks after starting Lexapro . The itching was severe for about two to three days and has been gradually improving since discontinuing the medication a week ago. The itching is not completely resolved but is decreasing in intensity.  She started Lexapro  at a dose of 5 mg for low energy levels and anxiety. Prior to Lexapro , she was on bupropion , which improved her energy but caused cognitive fog, leading to its discontinuation. She has a history of anxiety and low energy but denies feeling depressed.   Her current medications include Fosamax  70 mg weekly for osteoporosis, levothyroxine 25 mcg daily for hypothyroidism, and memantine  10 mg twice daily for memory issues. She has no formal diagnosis of dementia but reports memory deficits.  In terms of social history, she accompanies her husband who volunteers for [employer]. She enjoys reading and is encouraged to engage in activities to stimulate her brain. She walks regularly, though not consistently, and aims to increase her physical activity.   Past Medical History:  Diagnosis Date   SVT (short bursts of supraventricular tachycardia) (HCC)    By report, short bursts of SVT on monitor.  Not  sustained.   Anxiety and depression    DDD (degenerative disc disease), lumbar    Fatigue    GAD (generalized anxiety disorder)    History of mitral valve prolapse    Diagnosed 2018 with severe MR from MVP-referred for MVR   History of repair of mitral valve 05/2017   [facility]: Severe MR with MVP   Hypertensive kidney disease    Hypothyroid    Vitamin D  deficiency     Past Surgical History:  Procedure Laterality Date   ABDOMINAL HYSTERECTOMY  1974   Dr. Myrtie, [facility] [place]   LEFT HEART CATH AND CORONARY ANGIOGRAPHY  04/18/2017   [facility]; Dr. Isidor -> preop for MVR: Right dominant system.  20% proximal LAD-calcified.  From ostial RCA.  Otherwise no notable CAD   MITRAL VALVE REPAIR  2018   Dr. Burnie, [facility]   TEE WITHOUT CARDIOVERSION  02/2017   [facility]: MR noted to be severe   TONSILLECTOMY  1955   [facility]   TRANSTHORACIC ECHOCARDIOGRAM  12/2016   [facility]: EF 55%.  Normal LV function and wall motion.  Posterior mitral leaflet prolapse with anterior directed moderate to severe MR.  Mild LA dilation.   TRANSTHORACIC ECHOCARDIOGRAM  07/2022   [facility]: Normal LV size and function with EF of 60 to 65%.  No RWMA.  Normal RV size and function.  S/p MVR with angioplasty ring  trace MR and mild MS.  Mean gradient 3.6 mmHg.  No change from prior study   TRANSTHORACIC ECHOCARDIOGRAM  09/26/2023   [facility]: LVEF 60 to 65%.  No RWMA.  Paradoxical septal motion consistent with RV volume overload versus postop state.  RV size is normal but unable to assess PAP.  Mitral valve has been repaired.  Ring in place.  Mean mitral gradient 3 mm..  Normal.  Mild aortic valve calcification.  RAP estimated 3 mmHg.    Allergies  Allergen Reactions   Penicillin G Benzathine & Proc Other (See Comments)     Unknown.    Outpatient Encounter Medications as of 02/17/2024  Medication Sig   alendronate  (FOSAMAX ) 70 MG tablet Take 1 tablet (70 mg total) by mouth every 7 (seven) days. Take with a full glass of water on an empty stomach.   levothyroxine (SYNTHROID) 25 MCG tablet TAKE ONE & ONE-HALF TABLETS BY MOUTH DAILY BEFORE BREAKFAST.   MAGNESIUM  GLYCINATE ADVANCED PO Take 240 mg by mouth at bedtime.   memantine  (NAMENDA ) 10 MG tablet Take 1 tablet (10 mg total) by mouth 2 (two) times daily.   Multiple Vitamin (MULTIVITAMIN) capsule Take 1 capsule by mouth daily.   [DISCONTINUED] buPROPion  (WELLBUTRIN ) 75 MG tablet Take 0.5 tablets (37.5 mg total) by mouth daily.   buPROPion  (WELLBUTRIN ) 75 MG tablet Take 0.5 tablets (37.5 mg total) by mouth 2 (two) times daily. Take 37.5 mg daily X 7 days then twice day thereafter   [DISCONTINUED] docusate sodium  (COLACE) 100 MG capsule Take 1 capsule (100 mg total) by mouth 2 (two) times daily.   [DISCONTINUED] escitalopram  (LEXAPRO ) 5 MG tablet TAKE 1 TABLET (5 MG TOTAL) BY MOUTH DAILY. (Patient not taking: Reported on 02/17/2024)   No facility-administered encounter medications on file as of 02/17/2024.    Review of Systems:  Review of Systems  Constitutional:  Negative for appetite change, chills, fatigue and fever.  HENT:  Negative for congestion, hearing loss, rhinorrhea and sore throat.   Eyes: Negative.   Respiratory:  Negative for cough, shortness of breath and wheezing.   Cardiovascular:  Negative for chest pain, palpitations and leg swelling.  Gastrointestinal:  Negative for abdominal pain, constipation, diarrhea, nausea and vomiting.  Genitourinary:  Negative for dysuria.  Musculoskeletal:  Negative for arthralgias, back pain and myalgias.  Skin:  Negative for color change, rash and wound.       Facial itching  Neurological:  Negative for dizziness, weakness and headaches.  Psychiatric/Behavioral:  Negative for behavioral problems. The patient is not  nervous/anxious.     Health Maintenance  Topic Date Due   COVID-19 Vaccine (7 - 2024-25 season) 03/17/2023   INFLUENZA VACCINE  02/14/2024   Medicare Annual Wellness (AWV)  06/16/2024   DTaP/Tdap/Td (3 - Td or Tdap) 11/13/2032   Pneumococcal Vaccine: 50+ Years  Completed   DEXA SCAN  Completed   Zoster Vaccines- Shingrix  Completed   Hepatitis B Vaccines  Aged Out   HPV VACCINES  Aged Out   Meningococcal B Vaccine  Aged Out   Hepatitis C Screening  Discontinued    Physical Exam: Vitals:   02/17/24 1112  BP: 117/68  Pulse: 64  Resp: 18  Temp: (!) 97.1 F (36.2 C)  SpO2: 96%  Weight: 137 lb 14.4 oz (62.6 kg)  Height: 5' 3 (1.6 m)   Body mass index is 24.43 kg/m. Physical Exam Constitutional:      Appearance: Normal appearance.  HENT:  Head: Normocephalic and atraumatic.     Nose: Nose normal.     Mouth/Throat:     Mouth: Mucous membranes are moist.  Eyes:     Conjunctiva/sclera: Conjunctivae normal.  Cardiovascular:     Rate and Rhythm: Normal rate and regular rhythm.  Pulmonary:     Effort: Pulmonary effort is normal.     Breath sounds: Normal breath sounds.  Abdominal:     General: Bowel sounds are normal.     Palpations: Abdomen is soft.  Musculoskeletal:        General: Normal range of motion.     Cervical back: Normal range of motion.  Skin:    General: Skin is warm and dry.  Neurological:     General: No focal deficit present.     Mental Status: She is alert and oriented to person, place, and time.  Psychiatric:        Mood and Affect: Mood normal.        Behavior: Behavior normal.        Thought Content: Thought content normal.        Judgment: Judgment normal.     Labs reviewed: Basic Metabolic Panel: Recent Labs    09/17/23 0707  NA 139  K 4.6  CL 103  CO2 29  GLUCOSE 92  BUN 12  CREATININE 0.93  CALCIUM 9.2  TSH 2.75   Liver Function Tests: Recent Labs    09/17/23 0707  AST 22  ALT 15  BILITOT 0.5  PROT 7.0   No  results for input(s): LIPASE, AMYLASE in the last 8760 hours. No results for input(s): AMMONIA in the last 8760 hours. CBC: Recent Labs    09/17/23 0707  WBC 5.7  NEUTROABS 2,725  HGB 15.2  HCT 46.4*  MCV 96.7  PLT 291   Lipid Panel: No results for input(s): CHOL, HDL, LDLCALC, TRIG, CHOLHDL, LDLDIRECT in the last 8760 hours. Lab Results  Component Value Date   HGBA1C 5.8 (H) 10/16/2022    Procedures since last visit: No results found.  Assessment/Plan  Pruritus likely due to Lexapro  (escitalopram ) Pruritus developed after starting Lexapro , improved after discontinuation. Lexapro  suspected cause. - Discontinue Lexapro .  Generalized anxiety disorder Anxiety long-standing. Lexapro  discontinued due to pruritus. Bupropion  considered for energy improvement. - Initiate bupropion  37.5 mg once daily for 7 days, then increase to 37.5 mg twice daily if tolerated. - Monitor for adverse effects such as foggy brain or itching. - Discontinue bupropion  if adverse effects occur.  Memory deficits Reports memory issues, no formal dementia diagnosis.  - Encourage participation in brain exercises such as crossword puzzles and reading. - Encourage physical exercise and social activities to support cognitive function.  Hypothyroidism Managed with levothyroxine. Recent TSH normal at 2.75.  Age-related osteoporosis without current pathological fracture Osteoporosis managed with Fosamax . No current fractures.      Labs/tests ordered:   None   Return if symptoms worsen or fail to improve.  Maydelin Deming Medina-Vargas, NP

## 2024-02-24 ENCOUNTER — Other Ambulatory Visit: Payer: Self-pay | Admitting: Nurse Practitioner

## 2024-03-02 DIAGNOSIS — H2511 Age-related nuclear cataract, right eye: Secondary | ICD-10-CM | POA: Diagnosis not present

## 2024-03-02 DIAGNOSIS — Z961 Presence of intraocular lens: Secondary | ICD-10-CM | POA: Diagnosis not present

## 2024-03-02 DIAGNOSIS — H43813 Vitreous degeneration, bilateral: Secondary | ICD-10-CM | POA: Diagnosis not present

## 2024-03-02 DIAGNOSIS — H40003 Preglaucoma, unspecified, bilateral: Secondary | ICD-10-CM | POA: Diagnosis not present

## 2024-03-02 DIAGNOSIS — H04123 Dry eye syndrome of bilateral lacrimal glands: Secondary | ICD-10-CM | POA: Diagnosis not present

## 2024-03-02 DIAGNOSIS — H25011 Cortical age-related cataract, right eye: Secondary | ICD-10-CM | POA: Diagnosis not present

## 2024-03-02 DIAGNOSIS — H524 Presbyopia: Secondary | ICD-10-CM | POA: Diagnosis not present

## 2024-03-06 ENCOUNTER — Encounter: Payer: Self-pay | Admitting: Sports Medicine

## 2024-03-06 ENCOUNTER — Non-Acute Institutional Stay: Admitting: Sports Medicine

## 2024-03-06 VITALS — BP 126/82 | HR 67 | Temp 97.8°F | Resp 11 | Ht 63.0 in | Wt 137.4 lb

## 2024-03-06 DIAGNOSIS — R413 Other amnesia: Secondary | ICD-10-CM

## 2024-03-06 DIAGNOSIS — F321 Major depressive disorder, single episode, moderate: Secondary | ICD-10-CM | POA: Diagnosis not present

## 2024-03-06 DIAGNOSIS — E039 Hypothyroidism, unspecified: Secondary | ICD-10-CM

## 2024-03-06 DIAGNOSIS — F411 Generalized anxiety disorder: Secondary | ICD-10-CM

## 2024-03-06 MED ORDER — BUPROPION HCL 75 MG PO TABS: 75.0000 mg | ORAL_TABLET | Freq: Two times a day (BID) | ORAL | 2 refills | Status: AC

## 2024-03-06 NOTE — Patient Instructions (Addendum)
 Please come for blood work on Tuesday morning 7.30  03/17/24

## 2024-03-06 NOTE — Progress Notes (Signed)
 [place] [facility] Careteam: Patient Care Team: Sherlynn Madden, MD as PCP - General (Internal Medicine) Anner Alm ORN, MD as PCP - Cardiology (Cardiology)  PLACE OF SERVICE:  [facility]  Advanced Directive information    Allergies  Allergen Reactions   Penicillin G Benzathine & Proc Other (See Comments)    Unknown.    Chief Complaint  Patient presents with   Medical Management of Chronic Issues    Follow up      Discussed the use of AI scribe software for clinical note transcription with the patient, who gave verbal consent to proceed.  History of Present Illness  Kristine [name] is an 83 year old female who presents for medication management and follow-up for anxiety.  She has been experiencing anxiety and was previously prescribed escitalopram , which she discontinued due to lack of efficacy. She is currently taking bupropion , initially at 75 mg immediate release, half a tablet in the morning and half in the afternoon for two weeks, then increased to one full tablet in the morning for the past five days. She does not feel depressed or overly anxious and is tolerating the medication well.  She reports a loss of sense of taste but maintains a stable weight of 137 lbs. Her appetite is adequate despite the lack of taste, and she eats healthily. No issues with breathing, chest pain, nausea, or vomiting. 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  Her sleep is generally good, with occasional restlessness. She typically wakes up around 6 or 7 AM to take her thyroid  medication and then returns to bed until 7:30 or 8 AM. She denies any significant mood disturbances, although she occasionally feels like crying but does not usually cry. She remains active, attending chair yoga three times a week and engaging in household  activities. She dislikes long trips but maintains interest in local activities and programs.  She has a history of thyroid  issues and takes thyroid  medication regularly. She experiences sweating, which has been ongoing for 15 years, and is good at staying hydrated. No dizziness except occasionally when standing up, which resolves quickly.    Review of Systems:  Review of Systems  Constitutional:  Negative for chills and fever.  HENT:  Negative for congestion and sore throat.   Eyes:  Negative for double vision.  Respiratory:  Negative for cough, sputum production and shortness of breath.   Cardiovascular:  Negative for chest pain, palpitations and leg swelling.  Gastrointestinal:  Negative for abdominal pain, heartburn and nausea.  Genitourinary:  Negative for dysuria, frequency and hematuria.  Musculoskeletal:  Negative for falls and myalgias.  Neurological:  Negative for dizziness.  Psychiatric/Behavioral:  Positive for depression and memory loss.    Negative unless indicated in HPI.   Past Medical History:  Diagnosis Date   SVT (short bursts of supraventricular tachycardia) (HCC)    By report, short bursts of SVT on monitor.  Not sustained.   Anxiety and depression    DDD (degenerative disc disease), lumbar    Fatigue    GAD (generalized anxiety disorder)    History of mitral valve prolapse    Diagnosed 2018 with severe MR from MVP-referred for MVR  History of repair of mitral valve 05/2017   [facility]: Severe MR with MVP   Hypertensive kidney disease    Hypothyroid    Vitamin D  deficiency    Past Surgical History:  Procedure Laterality Date   ABDOMINAL HYSTERECTOMY  1974   Dr. Myrtie, [facility] [place]   LEFT HEART CATH AND CORONARY ANGIOGRAPHY  04/18/2017   [facility]; Dr. Isidor -> preop for MVR: Right dominant system.  20% proximal LAD-calcified.  From ostial RCA.  Otherwise no notable CAD   MITRAL VALVE  REPAIR  2018   Dr. Burnie, [facility]   TEE WITHOUT CARDIOVERSION  02/2017   [facility]: MR noted to be severe   TONSILLECTOMY  1955   [facility]   TRANSTHORACIC ECHOCARDIOGRAM  12/2016   [facility]: EF 55%.  Normal LV function and wall motion.  Posterior mitral leaflet prolapse with anterior directed moderate to severe MR.  Mild LA dilation.   TRANSTHORACIC ECHOCARDIOGRAM  07/2022   [facility]: Normal LV size and function with EF of 60 to 65%.  No RWMA.  Normal RV size and function.  S/p MVR with angioplasty ring trace MR and mild MS.  Mean gradient 3.6 mmHg.  No change from prior study   TRANSTHORACIC ECHOCARDIOGRAM  09/26/2023   [facility]: LVEF 60 to 65%.  No RWMA.  Paradoxical septal motion consistent with RV volume overload versus postop state.  RV size is normal but unable to assess PAP.  Mitral valve has been repaired.  Ring in place.  Mean mitral gradient 3 mm..  Normal.  Mild aortic valve calcification.  RAP estimated 3 mmHg.   Social History:   reports that she has never smoked. She has never used smokeless tobacco. She reports current alcohol use. She reports that she does not use drugs.  Family History  Problem Relation Age of Onset   Parkinson's disease Mother    Dementia Mother    Prostate cancer Father    Stroke Sister    High Cholesterol Sister    Anxiety disorder Brother    Depression Brother    Panic disorder Brother    Dementia Maternal Grandfather    Parkinson's disease Maternal Grandfather     Medications: Patient's Medications  New Prescriptions   No medications on file  Previous Medications   ALENDRONATE  (FOSAMAX ) 70 MG TABLET    Take 1 tablet (70 mg total) by mouth every 7 (seven) days. Take with a full glass of water on an empty stomach.   BUPROPION  (WELLBUTRIN ) 75 MG TABLET    Take 0.5 tablets (37.5 mg total) by mouth 2 (two) times  daily. Take 37.5 mg daily X 7 days then twice day thereafter   LEVOTHYROXINE (SYNTHROID) 25 MCG TABLET    TAKE ONE & ONE-HALF TABLETS BY MOUTH DAILY BEFORE BREAKFAST.   MAGNESIUM  GLYCINATE ADVANCED PO    Take 240 mg by mouth at bedtime.   MEMANTINE  (NAMENDA ) 10 MG TABLET    Take 1 tablet (10 mg total) by mouth 2 (two) times daily.   MULTIPLE VITAMIN (MULTIVITAMIN) CAPSULE    Take 1 capsule by mouth daily.  Modified Medications   No medications on file  Discontinued Medications   No medications on file    Physical Exam: Vitals:   03/06/24 1035  BP: 126/82  Pulse: 67  Resp: 11  Temp: 97.8 F (36.6 C)  SpO2: 97%  Weight: 137 lb 6.4 oz (62.3 kg)  Height: 5' 3 (1.6 m)   Body mass index is 24.34 kg/m. BP Readings from Last 3 Encounters:  03/06/24 126/82  02/17/24 117/68  01/24/24 112/76   Wt Readings from Last 3 Encounters:  03/06/24 137 lb 6.4 oz (62.3 kg)  02/17/24 137 lb 14.4 oz (62.6 kg)  01/24/24 137 lb (62.1 kg)    Physical Exam Constitutional:      Appearance: Normal appearance.  HENT:     Head: Normocephalic and atraumatic.  Cardiovascular:     Rate and Rhythm: Normal rate and regular rhythm.  Pulmonary:     Effort: Pulmonary effort is normal. No respiratory distress.     Breath sounds: Normal breath sounds. No wheezing.  Abdominal:     General: Bowel sounds are normal. There is no distension.     Tenderness: There is no abdominal tenderness. There is no guarding or rebound.     Comments:    Musculoskeletal:        General: No swelling.  Neurological:     Mental Status: She is alert. Mental status is at baseline.     Motor: No weakness.     Labs reviewed: Basic Metabolic Panel: Recent Labs    09/17/23 0707  NA 139  K 4.6  CL 103  CO2 29  GLUCOSE 92  BUN 12  CREATININE 0.93  CALCIUM 9.2  TSH 2.75   Liver Function Tests: Recent Labs    09/17/23 0707  AST 22  ALT 15  BILITOT 0.5  PROT 7.0   No results for input(s): LIPASE, AMYLASE  in the last 8760 hours. No results for input(s): AMMONIA in the last 8760 hours. CBC: Recent Labs    09/17/23 0707  WBC 5.7  NEUTROABS 2,725  HGB 15.2  HCT 46.4*  MCV 96.7  PLT 291   Lipid Panel: No results for input(s): CHOL, HDL, LDLCALC, TRIG, CHOLHDL, LDLDIRECT in the last 8760 hours. TSH: Recent Labs    09/17/23 0707  TSH 2.75   A1C: Lab Results  Component Value Date   HGBA1C 5.8 (H) 10/16/2022    Assessment and Plan Assessment & Plan   1. GAD (generalized anxiety disorder) Husband reports that pt is tolerating the medication but haven't noticed improvement in her mood He says she does not participate in any activity She is able to do most of her ADLS Will increase wellbutrin  to 75mg  twice daily - buPROPion  (WELLBUTRIN ) 75 MG tablet; Take 1 tablet (75 mg total) by mouth 2 (two) times daily.  Dispense: 180 tablet; Refill: 2 - Basic Metabolic Panel  2. Hypothyroidism, unspecified type (Primary) Lab Results  Component Value Date   TSH 2.75 09/17/2023   Will check TSH and adjust dose accordingly - TSH - Basic Metabolic Panel  3. Current moderate episode of major depressive disorder without prior episode (HCC) Will increase wellbutrin  to 75 mg twice daily - Basic Metabolic Panel  4. Memory impairment Increase physical activity and cognitively engaging activities - Basic Metabolic Panel

## 2024-03-16 ENCOUNTER — Encounter: Payer: Self-pay | Admitting: Sports Medicine

## 2024-04-01 ENCOUNTER — Other Ambulatory Visit: Payer: Self-pay | Admitting: Sports Medicine

## 2024-04-01 DIAGNOSIS — R413 Other amnesia: Secondary | ICD-10-CM

## 2024-05-08 ENCOUNTER — Encounter: Payer: Self-pay | Admitting: Sports Medicine

## 2024-05-08 ENCOUNTER — Non-Acute Institutional Stay: Admitting: Sports Medicine

## 2024-05-08 VITALS — BP 124/72 | HR 87 | Temp 97.3°F | Ht 63.0 in | Wt 135.0 lb

## 2024-05-08 DIAGNOSIS — E039 Hypothyroidism, unspecified: Secondary | ICD-10-CM | POA: Diagnosis not present

## 2024-05-08 DIAGNOSIS — M810 Age-related osteoporosis without current pathological fracture: Secondary | ICD-10-CM | POA: Diagnosis not present

## 2024-05-08 DIAGNOSIS — F0390 Unspecified dementia without behavioral disturbance: Secondary | ICD-10-CM | POA: Diagnosis not present

## 2024-05-08 DIAGNOSIS — F321 Major depressive disorder, single episode, moderate: Secondary | ICD-10-CM | POA: Diagnosis not present

## 2024-05-08 NOTE — Progress Notes (Signed)
 Careteam: Patient Care Team: Sherlynn Madden, MD as PCP - General (Internal Medicine) Anner Alm ORN, MD as PCP - Cardiology (Cardiology)  PLACE OF SERVICE:  [facility]  Advanced Directive information                              [facility] Allergies  Allergen Reactions   Penicillin G Benzathine & Proc Other (See Comments)    Unknown.    Chief Complaint  Patient presents with   Medical Management of Chronic Issues    2 month follow-up. Discuss need for covid booster and schedule annual wellness visit. Here with spouse Norleen    Skin Problem    Bumps on forehead and black spot near mouth    Foot Swelling    Bottom of legs and feet. Patient states legs are strange looking   Loss of taste     Discussed the use of AI scribe software for clinical note transcription with the patient, who gave verbal consent to proceed.  History of Present Illness    Kristine [name] is an 83 year old female with depression and hypothyroidism who presents with fatigue and energy concerns.  She experiences persistent fatigue and low energy levels despite an increase in her Wellbutrin  (bupropion ) dosage to 75 mg twice a day in June or July. She is uncertain if the fatigue is due to decreased physical activity or insufficient medication effect.  She is on levothyroxine for hypothyroidism but has not had recent thyroid  function tests. Her current medications also include Namenda  10 mg twice a day, Fosamax  once a week, a multivitamin, Colace twice a day, and magnesium .  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 No dizziness, lightheadedness, or joint pain when standing or walking. No shortness of breath or stomach pain. No issues with urination or bowel movements, although she sometimes skips a day or two. She consumes six to eight glasses of water daily.  She reports a loss of taste affecting her appetite, although  she eats well. No blood in urine or stool. She sleeps well, usually waking once to use the bathroom.   She has a wart on her foot for about two months, treated with Dr. Heriberto acid treatment twice a day, with some improvement.  Review of Systems:  Review of Systems  Constitutional:  Positive for malaise/fatigue. Negative for chills and fever.  HENT:  Negative for congestion and sore throat.   Eyes:  Negative for double vision.  Respiratory:  Negative for cough, sputum production and shortness of breath.   Cardiovascular:  Negative for chest pain, palpitations and leg swelling.  Gastrointestinal:  Negative for abdominal pain, heartburn and nausea.  Genitourinary:  Negative for dysuria, frequency and hematuria.  Musculoskeletal:  Negative for falls and myalgias.  Neurological:  Negative for dizziness, sensory change and focal weakness.   Negative unless indicated in HPI.   Past Medical History:  Diagnosis Date   SVT (short bursts of supraventricular tachycardia) (HCC)    By report, short bursts of SVT on monitor.  Not sustained.   Anxiety and depression    DDD (degenerative disc disease), lumbar    Fatigue    GAD (generalized anxiety disorder)    History of mitral valve prolapse    Diagnosed 2018 with severe MR from MVP-referred for MVR   History of repair of mitral valve 05/2017   Dr.  [facility]: Severe MR with MVP   Hypertensive kidney disease    Hypothyroid    Vitamin D  deficiency    Past Surgical History:  Procedure Laterality Date   ABDOMINAL HYSTERECTOMY  1974   Dr. Myrtie, [facility] [place]   LEFT HEART CATH AND CORONARY ANGIOGRAPHY  04/18/2017   [facility]; Dr. Isidor -> preop for MVR: Right dominant system.  20% proximal LAD-calcified.  From ostial RCA.  Otherwise no notable CAD   MITRAL VALVE REPAIR  2018   Dr. Burnie, [facility]   TEE WITHOUT CARDIOVERSION  02/2017   [facility]: MR noted to be severe   TONSILLECTOMY  1955   [facility]   TRANSTHORACIC ECHOCARDIOGRAM  12/2016   [facility]: EF 55%.  Normal LV function and wall motion.  Posterior mitral leaflet prolapse with anterior directed moderate to severe MR.  Mild LA dilation.   TRANSTHORACIC ECHOCARDIOGRAM  07/2022   [facility]: Normal LV size and function with EF of 60 to 65%.  No RWMA.  Normal RV size and function.  S/p MVR with angioplasty ring trace MR and mild MS.  Mean gradient 3.6 mmHg.  No change from prior study   TRANSTHORACIC ECHOCARDIOGRAM  09/26/2023   [facility]: LVEF 60 to 65%.  No RWMA.  Paradoxical septal motion consistent with RV volume overload versus postop state.  RV size is normal but unable to assess PAP.  Mitral valve has been repaired.  Ring in place.  Mean mitral gradient 3 mm..  Normal.  Mild aortic valve calcification.  RAP estimated 3 mmHg.   Social History:   reports that she has never smoked. She has never used smokeless tobacco. She reports current alcohol use. She reports that she does not use drugs.  Family History  Problem Relation Age of Onset   Parkinson's disease Mother    Dementia Mother    Prostate cancer Father    Stroke Sister    High Cholesterol Sister    Anxiety disorder Brother    Depression Brother    Panic disorder Brother    Dementia Maternal Grandfather    Parkinson's disease Maternal Grandfather     Medications: Patient's Medications  New Prescriptions   No medications on file  Previous Medications   ALENDRONATE  (FOSAMAX ) 70 MG TABLET    Take 1 tablet (70 mg total) by mouth every 7 (seven) days. Take with a full glass of water on an empty stomach.   BUPROPION  (WELLBUTRIN ) 75 MG TABLET    Take 1 tablet (75 mg total) by mouth 2 (two) times daily.   LEVOTHYROXINE (SYNTHROID) 25 MCG TABLET    TAKE ONE & ONE-HALF TABLETS BY MOUTH DAILY BEFORE BREAKFAST.    MAGNESIUM  GLYCINATE ADVANCED PO    Take 240 mg by mouth at bedtime.   MEMANTINE  (NAMENDA ) 10 MG TABLET    TAKE 1 TABLET BY MOUTH TWICE A DAY   MULTIPLE VITAMIN (MULTIVITAMIN) CAPSULE    Take 1 capsule by mouth daily.  Modified Medications   No medications on file  Discontinued Medications   No medications on file    Physical Exam: Vitals:   05/08/24 0819  BP: 124/72  Temp: (!) 97.3 F (36.3 C)  Weight: 135 lb (61.2 kg)  Height: 5' 3 (1.6 m)   Body mass index is 23.91 kg/m. BP Readings from Last 3 Encounters:  05/08/24 124/72  03/06/24 126/82  02/17/24 117/68   Wt Readings from Last 3 Encounters:  05/08/24 135 lb (61.2 kg)  03/06/24 137 lb 6.4 oz (62.3 kg)  02/17/24 137 lb 14.4 oz (62.6 kg)    Physical Exam Constitutional:      Appearance: Normal appearance.  HENT:     Head: Normocephalic and atraumatic.  Cardiovascular:     Rate and Rhythm: Normal rate and regular rhythm.  Pulmonary:     Effort: Pulmonary effort is normal. No respiratory distress.     Breath sounds: Normal breath sounds. No wheezing.  Abdominal:     General: Bowel sounds are normal. There is no distension.     Tenderness: There is no abdominal tenderness. There is no guarding or rebound.     Comments:    Musculoskeletal:        General: No swelling.  Neurological:     Mental Status: She is alert. Mental status is at baseline.     Sensory: No sensory deficit.     Motor: No weakness.     Labs reviewed: Basic Metabolic Panel: Recent Labs    09/17/23 0707  NA 139  K 4.6  CL 103  CO2 29  GLUCOSE 92  BUN 12  CREATININE 0.93  CALCIUM 9.2  TSH 2.75   Liver Function Tests: Recent Labs    09/17/23 0707  AST 22  ALT 15  BILITOT 0.5  PROT 7.0   No results for input(s): LIPASE, AMYLASE in the last 8760 hours. No results for input(s): AMMONIA in the last 8760 hours. CBC: Recent Labs    09/17/23 0707  WBC 5.7  NEUTROABS 2,725  HGB 15.2  HCT 46.4*  MCV 96.7  PLT 291    Lipid Panel: No results for input(s): CHOL, HDL, LDLCALC, TRIG, CHOLHDL, LDLDIRECT in the last 8760 hours. TSH: Recent Labs    09/17/23 0707  TSH 2.75   A1C: Lab Results  Component Value Date   HGBA1C 5.8 (H) 10/16/2022    Assessment and Plan Assessment & Plan  1. Hypothyroidism, unspecified type (Primary) Will check TSH and adjust dose accordingly - TSH  2. Current moderate episode of major depressive disorder without prior episode (HCC) Cont with wellbutrin  - CBC with Differential/Platelet - Complete Metabolic Panel with eGFR  3. Major neurocognitive disorder (HCC) Increase cognitively engaging activities and physical activity - CBC with Differential/Platelet - Complete Metabolic Panel with eGFR  4. Age related osteoporosis, unspecified pathological fracture presence Increase weight bearing exercises Cont with fosamax  - Vitamin D , 25-hydroxy  Other orders - docusate sodium  (COLACE) 100 MG capsule; Take 100 mg by mouth 2 (two) times daily.

## 2024-05-08 NOTE — Patient Instructions (Addendum)
 Visit local pharmacy to receive additional Covid boosters in the future   2.) Fasting labs on Tuesday 05/12/24 @ [facility]

## 2024-05-12 ENCOUNTER — Ambulatory Visit: Payer: Self-pay | Admitting: Sports Medicine

## 2024-05-12 DIAGNOSIS — E039 Hypothyroidism, unspecified: Secondary | ICD-10-CM | POA: Diagnosis not present

## 2024-05-12 DIAGNOSIS — F0390 Unspecified dementia without behavioral disturbance: Secondary | ICD-10-CM | POA: Diagnosis not present

## 2024-05-12 DIAGNOSIS — F321 Major depressive disorder, single episode, moderate: Secondary | ICD-10-CM | POA: Diagnosis not present

## 2024-05-12 LAB — CBC WITH DIFFERENTIAL/PLATELET
Absolute Lymphocytes: 1885 {cells}/uL (ref 850–3900)
Absolute Monocytes: 432 {cells}/uL (ref 200–950)
Basophils Absolute: 28 {cells}/uL (ref 0–200)
Basophils Relative: 0.6 %
Eosinophils Absolute: 42 {cells}/uL (ref 15–500)
Eosinophils Relative: 0.9 %
HCT: 45.7 % — ABNORMAL HIGH (ref 35.0–45.0)
Hemoglobin: 15.2 g/dL (ref 11.7–15.5)
MCH: 32 pg (ref 27.0–33.0)
MCHC: 33.3 g/dL (ref 32.0–36.0)
MCV: 96.2 fL (ref 80.0–100.0)
MPV: 10.2 fL (ref 7.5–12.5)
Monocytes Relative: 9.2 %
Neutro Abs: 2312 {cells}/uL (ref 1500–7800)
Neutrophils Relative %: 49.2 %
Platelets: 282 Thousand/uL (ref 140–400)
RBC: 4.75 Million/uL (ref 3.80–5.10)
RDW: 12 % (ref 11.0–15.0)
Total Lymphocyte: 40.1 %
WBC: 4.7 Thousand/uL (ref 3.8–10.8)

## 2024-05-12 LAB — COMPLETE METABOLIC PANEL WITHOUT GFR
AG Ratio: 1.7 (calc) (ref 1.0–2.5)
ALT: 16 U/L (ref 6–29)
AST: 21 U/L (ref 10–35)
Albumin: 4.5 g/dL (ref 3.6–5.1)
Alkaline phosphatase (APISO): 68 U/L (ref 37–153)
BUN/Creatinine Ratio: 13 (calc) (ref 6–22)
BUN: 17 mg/dL (ref 7–25)
CO2: 26 mmol/L (ref 20–32)
Calcium: 9.1 mg/dL (ref 8.6–10.4)
Chloride: 104 mmol/L (ref 98–110)
Creat: 1.27 mg/dL — ABNORMAL HIGH (ref 0.60–0.95)
Globulin: 2.6 g/dL (ref 1.9–3.7)
Glucose, Bld: 99 mg/dL (ref 65–99)
Potassium: 4.3 mmol/L (ref 3.5–5.3)
Sodium: 141 mmol/L (ref 135–146)
Total Bilirubin: 0.8 mg/dL (ref 0.2–1.2)
Total Protein: 7.1 g/dL (ref 6.1–8.1)

## 2024-05-12 LAB — TSH: TSH: 3.9 m[IU]/L (ref 0.40–4.50)

## 2024-05-12 LAB — VITAMIN D 25 HYDROXY (VIT D DEFICIENCY, FRACTURES): Vit D, 25-Hydroxy: 42 ng/mL (ref 30–100)

## 2024-05-17 ENCOUNTER — Other Ambulatory Visit: Payer: Self-pay | Admitting: Sports Medicine

## 2024-05-17 DIAGNOSIS — M810 Age-related osteoporosis without current pathological fracture: Secondary | ICD-10-CM

## 2024-05-26 DIAGNOSIS — B079 Viral wart, unspecified: Secondary | ICD-10-CM | POA: Diagnosis not present

## 2024-06-18 ENCOUNTER — Encounter: Payer: Self-pay | Admitting: Nurse Practitioner

## 2024-06-18 ENCOUNTER — Non-Acute Institutional Stay: Payer: Self-pay | Admitting: Nurse Practitioner

## 2024-06-18 VITALS — BP 124/78 | HR 56 | Temp 97.3°F | Ht 63.0 in | Wt 134.8 lb

## 2024-06-18 DIAGNOSIS — Z0000 Encounter for general adult medical examination without abnormal findings: Secondary | ICD-10-CM | POA: Diagnosis not present

## 2024-06-18 NOTE — Progress Notes (Signed)
 Chief Complaint  Patient presents with   Medicare Wellness    Annual wellness visit. Discuss details of MOST form. Here with spouse.      Subjective:   Kristine [name] is a 83 y.o. female who presents for a Medicare Annual Wellness Visit.  Visit info / Clinical Intake: Medicare Wellness Visit Type:: Subsequent Annual Wellness Visit Persons participating in visit and providing information:: patient & caregiver Medicare Wellness Visit Mode:: In-person (required for WTM) Interpreter Needed?: No Pre-visit prep was completed: yes AWV questionnaire completed by patient prior to visit?: no Living arrangements:: in retirement community Patient's Overall Health Status Rating: (!) fair Typical amount of pain: none Does pain affect daily life?: no Are you currently prescribed opioids?: no  Dietary Habits and Nutritional Risks How many meals a day?: 3 Eats fruit and vegetables daily?: yes Most meals are obtained by: having others provide food; preparing own meals In the last 2 weeks, have you had any of the following?: none Diabetic:: no  Fall Screening Falls in the past year?: 0 Number of falls in past year: 0 Was there an injury with Fall?: 0 Fall Risk Category Calculator: 0 Patient Fall Risk Level: Low Fall Risk  Fall Risk Patient at Risk for Falls Due to: No Fall Risks Fall risk Follow up: Falls evaluation completed  Cognitive Assessment Difficulty concentrating, remembering, or making decisions? : yes Will 6CIT or Mini Cog be Completed: yes What year is it?: 0 points What month is it?: 0 points Give patient an address phrase to remember (5 components): [address], [place]  About what time is it?: 0 points Count backwards from 20 to 1: 0 points Say the months of the year in reverse: 0 points Repeat the address phrase from earlier: 0 points 6 CIT Score: 0 points  Advance Directives (For Healthcare) Does Patient Have a Medical Advance Directive?: Yes Does  patient want to make changes to medical advance directive?: No - Patient declined Type of Advance Directive: Out of facility DNR (pink MOST or yellow form) Out of facility DNR (pink MOST or yellow form) in Chart? (Ambulatory ONLY): Yes - validated most recent copy scanned in chart Pre-existing out of facility DNR order (yellow form or pink MOST form): Pink MOST form placed in chart (order not valid for inpatient use) Would patient like information on creating a medical advance directive?: No - Patient declined    Allergies (verified) Penicillin g benzathine & proc   Current Medications (verified) Outpatient Encounter Medications as of 06/18/2024  Medication Sig   alendronate  (FOSAMAX ) 70 MG tablet TAKE 1 TABLET (70 MG TOTAL) BY MOUTH EVERY 7 DAYS WITH FULL GLASS WATER ON EMPTY STOMACH   buPROPion  (WELLBUTRIN ) 75 MG tablet Take 1 tablet (75 mg total) by mouth 2 (two) times daily.   docusate sodium  (COLACE) 100 MG capsule Take 100 mg by mouth 2 (two) times daily.   levothyroxine (SYNTHROID) 25 MCG tablet TAKE ONE & ONE-HALF TABLETS BY MOUTH DAILY BEFORE BREAKFAST.   MAGNESIUM  GLYCINATE ADVANCED PO Take 240 mg by mouth at bedtime.   memantine  (NAMENDA ) 10 MG tablet TAKE 1 TABLET BY MOUTH TWICE A DAY   Multiple Vitamin (MULTIVITAMIN) capsule Take 1 capsule by mouth daily.   No facility-administered encounter medications on file as of 06/18/2024.    History: Past Medical History:  Diagnosis Date   SVT (short bursts of supraventricular tachycardia) (HCC)    By report, short bursts of SVT on monitor.  Not sustained.   Anxiety and  depression    DDD (degenerative disc disease), lumbar    Fatigue    GAD (generalized anxiety disorder)    History of mitral valve prolapse    Diagnosed 2018 with severe MR from MVP-referred for MVR   History of repair of mitral valve 05/2017   [facility]: Severe MR with MVP   Hypertensive kidney disease    Hypothyroid     Vitamin D  deficiency    Past Surgical History:  Procedure Laterality Date   ABDOMINAL HYSTERECTOMY  1974   Dr. Myrtie, [facility] [place]   LEFT HEART CATH AND CORONARY ANGIOGRAPHY  04/18/2017   [facility]; Dr. Isidor -> preop for MVR: Right dominant system.  20% proximal LAD-calcified.  From ostial RCA.  Otherwise no notable CAD   MITRAL VALVE REPAIR  2018   Dr. Burnie, [facility]   TEE WITHOUT CARDIOVERSION  02/2017   [facility]: MR noted to be severe   TONSILLECTOMY  1955   [facility]   TRANSTHORACIC ECHOCARDIOGRAM  12/2016   [facility]: EF 55%.  Normal LV function and wall motion.  Posterior mitral leaflet prolapse with anterior directed moderate to severe MR.  Mild LA dilation.   TRANSTHORACIC ECHOCARDIOGRAM  07/2022   [facility]: Normal LV size and function with EF of 60 to 65%.  No RWMA.  Normal RV size and function.  S/p MVR with angioplasty ring trace MR and mild MS.  Mean gradient 3.6 mmHg.  No change from prior study   TRANSTHORACIC ECHOCARDIOGRAM  09/26/2023   [facility]: LVEF 60 to 65%.  No RWMA.  Paradoxical septal motion consistent with RV volume overload versus postop state.  RV size is normal but unable to assess PAP.  Mitral valve has been repaired.  Ring in place.  Mean mitral gradient 3 mm..  Normal.  Mild aortic valve calcification.  RAP estimated 3 mmHg.   Family History  Problem Relation Age of Onset   Parkinson's disease Mother    Dementia Mother    Prostate cancer Father    Stroke Sister    High Cholesterol Sister    Anxiety disorder Brother    Depression Brother    Panic disorder Brother    Dementia Maternal Grandfather    Parkinson's disease Maternal Grandfather    Social History   Occupational History   Not on file  Tobacco Use   Smoking status: Never   Smokeless tobacco: Never  Vaping Use   Vaping status:  Never Used  Substance and Sexual Activity   Alcohol use: Yes    Comment: 1-2 drinks a year   Drug use: Never   Sexual activity: Not on file   Tobacco Counseling Counseling given: Not Answered  SDOH Screenings   Food Insecurity: No Food Insecurity (02/17/2024)  Housing: Unknown (02/17/2024)  Transportation Needs: No Transportation Needs (02/17/2024)  Alcohol Screen: Low Risk  (02/17/2024)  Depression (PHQ2-9): Low Risk  (03/06/2024)  Financial Resource Strain: Low Risk  (02/17/2024)  Physical Activity: Insufficiently Active (02/17/2024)  Social Connections: Socially Integrated (02/17/2024)  Stress: No Stress Concern Present (02/17/2024)  Tobacco Use: Low Risk  (06/18/2024)   See flowsheets for full screening details  Depression Screen PHQ 2 & 9 Depression Scale- Over the past 2 weeks, how often have you been bothered by any of the following problems? Little interest or pleasure in doing things: 0 Feeling down, depressed,  or hopeless (PHQ Adolescent also includes...irritable): 1 PHQ-2 Total Score: 1 Trouble falling or staying asleep, or sleeping too much: 0 Feeling tired or having little energy: 0 Poor appetite or overeating (PHQ Adolescent also includes...weight loss): 0 Feeling bad about yourself - or that you are a failure or have let yourself or your family down: 0 Trouble concentrating on things, such as reading the newspaper or watching television (PHQ Adolescent also includes...like school work): 0 Moving or speaking so slowly that other people could have noticed. Or the opposite - being so fidgety or restless that you have been moving around a lot more than usual: 0 Thoughts that you would be better off dead, or of hurting yourself in some way: 0 PHQ-9 Total Score: 0 If you checked off any problems, how difficult have these problems made it for you to do your work, take care of things at home, or get along with other people?: Somewhat difficult  Depression Treatment Depression  Interventions/Treatment : Medication     Goals Addressed   None          Objective:    Today's Vitals   06/18/24 1131  BP: 124/78  Pulse: (!) 56  Temp: (!) 97.3 F (36.3 C)  TempSrc: Temporal  SpO2: 98%  Weight: 134 lb 12.8 oz (61.1 kg)  Height: 5' 3 (1.6 m)   Body mass index is 23.88 kg/m.  Hearing/Vision screen Hearing Screening - Comments:: Wears hearing aids that help  Vision Screening - Comments:: Last eye exam less than 12 months ago, Dr.Evans  Immunizations and Health Maintenance Health Maintenance  Topic Date Due   COVID-19 Vaccine (7 - 2025-26 season) 07/16/2024 (Originally 03/16/2024)   Medicare Annual Wellness (AWV)  06/18/2025   DTaP/Tdap/Td (3 - Td or Tdap) 11/13/2032   Pneumococcal Vaccine: 50+ Years  Completed   Influenza Vaccine  Completed   Bone Density Scan  Completed   Zoster Vaccines- Shingrix  Completed   Meningococcal B Vaccine  Aged Out   Hepatitis C Screening  Discontinued        Assessment/Plan:  This is a routine wellness examination for Kristine [name].  Patient Care Team: Kelbie Moro X, NP as PCP - General (Internal Medicine) Anner Alm ORN, MD as PCP - Cardiology (Cardiology)  I have personally reviewed and noted the following in the patient's chart:   Medical and social history Use of alcohol, tobacco or illicit drugs  Current medications and supplements including opioid prescriptions. Functional ability and status Nutritional status Physical activity Advanced directives List of other physicians Hospitalizations, surgeries, and ER visits in previous 12 months Vitals Screenings to include cognitive, depression, and falls Referrals and appointments  No orders of the defined types were placed in this encounter.  In addition, I have reviewed and discussed with patient certain preventive protocols, quality metrics, and best practice recommendations. A written personalized care plan for preventive services as well as general preventive  health recommendations were provided to patient.   Kristine Schoeller X Keivon Garden, NP   06/18/2024   Return in 1 year (on 06/18/2025).  After Visit Summary: (In Person-Printed) AVS printed and given to the patient

## 2024-06-23 DIAGNOSIS — B079 Viral wart, unspecified: Secondary | ICD-10-CM | POA: Diagnosis not present

## 2024-08-06 ENCOUNTER — Non-Acute Institutional Stay: Payer: Self-pay | Admitting: Nurse Practitioner

## 2024-08-06 ENCOUNTER — Encounter: Payer: Self-pay | Admitting: Nurse Practitioner

## 2024-08-06 VITALS — BP 122/78 | HR 69 | Temp 98.0°F | Ht 63.0 in | Wt 130.2 lb

## 2024-08-06 DIAGNOSIS — Z66 Do not resuscitate: Secondary | ICD-10-CM

## 2024-08-06 DIAGNOSIS — E039 Hypothyroidism, unspecified: Secondary | ICD-10-CM | POA: Diagnosis not present

## 2024-08-06 DIAGNOSIS — M810 Age-related osteoporosis without current pathological fracture: Secondary | ICD-10-CM | POA: Diagnosis not present

## 2024-08-06 DIAGNOSIS — F339 Major depressive disorder, recurrent, unspecified: Secondary | ICD-10-CM | POA: Diagnosis not present

## 2024-08-06 DIAGNOSIS — G3184 Mild cognitive impairment, so stated: Secondary | ICD-10-CM | POA: Diagnosis not present

## 2024-08-06 DIAGNOSIS — N1831 Chronic kidney disease, stage 3a: Secondary | ICD-10-CM | POA: Diagnosis not present

## 2024-08-06 DIAGNOSIS — E785 Hyperlipidemia, unspecified: Secondary | ICD-10-CM | POA: Diagnosis not present

## 2024-08-06 DIAGNOSIS — I471 Supraventricular tachycardia, unspecified: Secondary | ICD-10-CM | POA: Diagnosis not present

## 2024-08-06 DIAGNOSIS — M51369 Other intervertebral disc degeneration, lumbar region without mention of lumbar back pain or lower extremity pain: Secondary | ICD-10-CM | POA: Diagnosis not present

## 2024-08-06 DIAGNOSIS — K219 Gastro-esophageal reflux disease without esophagitis: Secondary | ICD-10-CM

## 2024-08-06 NOTE — Assessment & Plan Note (Signed)
 stable, off Esomeprazole , Hgb 15.2 05/12/24

## 2024-08-06 NOTE — Assessment & Plan Note (Signed)
Chronic lower back pain, lumbar radiculopathy,  f/u Ortho, off Gabapentin

## 2024-08-06 NOTE — Assessment & Plan Note (Signed)
 takes  Levothyroxine, TSH 3.9 05/12/24

## 2024-08-06 NOTE — Assessment & Plan Note (Signed)
 LDL 95 89 10/16/22,  not taking  Pravastatin , ASA

## 2024-08-06 NOTE — Assessment & Plan Note (Signed)
 Mild memory loss, saw Neurology, stopped taking Memantine , caused itching, now resumed.

## 2024-08-06 NOTE — Progress Notes (Signed)
 " Location:   Clinic [facility]   Place of Service:  Clinic (12) Provider: Larwance Orlander [place] NP  Kelse Ploch X, NP  Patient Care Team: Tationa Stech X, NP as PCP - General (Internal Medicine) Anner Alm ORN, MD as PCP - Cardiology (Cardiology)  Extended Emergency Contact Information Primary Emergency Contact: [facility] Address: [address]          [place], [place] [address] [place] Work Phone: [phone] Mobile Phone: [phone] Relation: Daughter Secondary Emergency Contact: Darleene Blamer Address: [address]          [place], [place] [address] [place] Work Phone: [phone] Mobile Phone: [phone] Relation: Son  Code Status:  DNR Goals of care: Advanced Directive information    08/06/2024    8:51 AM  Advanced Directives  Does Patient Have a Medical Advance Directive? Yes  Type of Advance Directive Out of facility DNR (pink MOST or yellow form)  Does patient want to make changes to medical advance directive? No - Patient declined  Pre-existing out of facility DNR order (yellow form or pink MOST form) Pink MOST form placed in chart (order not valid for inpatient use);Yellow form placed in chart (order not valid for inpatient use)     Chief Complaint  Patient presents with   Medical Management of Chronic Issues    3 month follow-up. [facility] verified. Discussed need for covid boosters (local pharmacy). Patient sees ready squares x 1-2 months.     HPI:  Pt is a 84 y.o. female seen today for medical management of chronic diseases.     Hx of ocular migraine, top of head and neck discomfort at times, not new. Mother had Parkinson/dementia.             Glaucoma, under ophthalmology care.  CVA,  followed by neurology, 03/11/22 MRI mild chronic small vessel ischemic changes,  remote age lacunar infarct in the left periventricular region Mild memory loss, saw Neurology, stopped taking Memantine , caused itching, now resumed.               Hypothyroidism, takes  Levothyroxine, TSH 3.9 05/12/24             Depression/anxiety, taking Wellbutrin  now, failed Sertraline , Lorazepam .              CKD Bun/creat 17/1.27 05/12/24             Hyponatremia, Na 141 05/12/24             S/p Mitral valve repair             Chronic lower back pain, lumbar radiculopathy,  f/u Ortho, off Gabapentin               Hyperlipidemia, LDL 95 89 10/16/22,  not taking  Pravastatin , ASA             Osteopenia, no recent fx. on Ca Vit D, and Fosamax , Vit D 42 05/12/24             GERD ,stable, off Esomeprazole , Hgb 15.2 05/12/24             Bradycardia, DOE, HR in 50, followed by Cardiology, 08/13/22 Echo EF 60-65             Prediabetes Hgb A1c 5.8, continue life style modification     Past Medical History:  Diagnosis Date   SVT (short bursts of supraventricular tachycardia) (HCC)    By report, short bursts of SVT on monitor.  Not sustained.   Anxiety and depression    DDD (degenerative disc disease), lumbar    Fatigue    GAD (generalized anxiety disorder)    History of mitral valve prolapse    Diagnosed 2018 with severe MR from [facility]-referred for MVR   History of repair of mitral valve 05/2017   Dr. [facility]: Severe MR with MVP   Hypertensive kidney disease    Hypothyroid    Vitamin D  deficiency    Past Surgical History:  Procedure Laterality Date   ABDOMINAL HYSTERECTOMY  1974   Dr. Myrtie, [facility] [place]   LEFT HEART CATH AND CORONARY ANGIOGRAPHY  04/18/2017   [facility]; Dr. Isidor -> preop for MVR: Right dominant system.  20% proximal LAD-calcified.  From ostial RCA.  Otherwise no notable CAD   MITRAL VALVE REPAIR  2018   Dr. Burnie, [facility]   TEE WITHOUT CARDIOVERSION  02/2017   [facility]: MR noted to be severe   TONSILLECTOMY  1955   [facility]   TRANSTHORACIC ECHOCARDIOGRAM  12/2016   [facility]  Cardiology: EF 55%.  Normal LV function and wall motion.  Posterior mitral leaflet prolapse with anterior directed moderate to severe MR.  Mild LA dilation.   TRANSTHORACIC ECHOCARDIOGRAM  07/2022   [facility]: Normal LV size and function with EF of 60 to 65%.  No RWMA.  Normal RV size and function.  S/p MVR with angioplasty ring trace MR and mild MS.  Mean gradient 3.6 mmHg.  No change from prior study   TRANSTHORACIC ECHOCARDIOGRAM  09/26/2023   [facility]: LVEF 60 to 65%.  No RWMA.  Paradoxical septal motion consistent with RV volume overload versus postop state.  RV size is normal but unable to assess PAP.  Mitral valve has been repaired.  Ring in place.  Mean mitral gradient 3 mm..  Normal.  Mild aortic valve calcification.  RAP estimated 3 mmHg.    Allergies[1]  Allergies as of 08/06/2024       Reactions   Penicillin G Benzathine & Proc Other (See Comments)   Unknown.        Medication List        Accurate as of August 06, 2024  4:15 PM. If you have any questions, ask your nurse or doctor.          alendronate  70 MG tablet Commonly known as: FOSAMAX  TAKE 1 TABLET (70 MG TOTAL) BY MOUTH EVERY 7 DAYS WITH FULL GLASS WATER ON EMPTY STOMACH   buPROPion  75 MG tablet Commonly known as: WELLBUTRIN  Take 1 tablet (75 mg total) by mouth 2 (two) times daily.   docusate sodium  100 MG capsule Commonly known as: COLACE Take 100 mg by mouth 2 (two) times daily.   ketoconazole 2 % cream Commonly known as: NIZORAL Apply 1 Application topically 2 (two) times daily. To right foot   levothyroxine 25 MCG tablet Commonly known as: SYNTHROID TAKE ONE & ONE-HALF TABLETS BY MOUTH DAILY BEFORE BREAKFAST.   MAGNESIUM  GLYCINATE ADVANCED PO Take 240 mg by mouth at bedtime.   memantine  10 MG tablet Commonly known as: NAMENDA  TAKE 1 TABLET BY MOUTH TWICE A DAY   multivitamin capsule Take 1 capsule by mouth daily.        Review of  Systems  Constitutional:  Negative for appetite change, fatigue and fever.       Not  new, mild  HENT:  Negative for congestion and voice change.   Eyes:  Negative for pain, redness and visual disturbance.       Dry eyes Vitreous degeneration floaters, comes and goes, not new,  pending Ophthalmology.   Respiratory:  Negative for shortness of breath and wheezing.        DOE  Cardiovascular:  Negative for leg swelling.  Gastrointestinal:  Negative for abdominal pain and constipation.       Hemorrhoidal external from previous exam  Genitourinary:  Negative for dysuria and frequency.  Musculoskeletal:  Positive for arthralgias and back pain. Negative for gait problem.       Occasional lower back pain.   Skin:  Negative for color change.       Hair thinning on top of head.   Neurological:  Negative for weakness and headaches.       Memory lapses.   Psychiatric/Behavioral:  Positive for sleep disturbance. Negative for confusion. The patient is not nervous/anxious.        Occasional     Immunization History  Administered Date(s) Administered   Fluad Quad(high Dose 65+) 04/30/2024   Hepatitis A 05/24/2000   Hepatitis A, Ped/Adol-2 Dose 05/24/2000   INFLUENZA, HIGH DOSE SEASONAL PF 04/15/2021   IPV 05/24/2000   Influenza-Unspecified 05/07/2022, 04/27/2024   Moderna SARS-COV2 Booster Vaccination 05/24/2020, 11/14/2020   Moderna Sars-Covid-2 Vaccination 07/20/2019, 08/17/2019   Pfizer Covid-19 Vaccine Bivalent Booster 34yrs & up 04/04/2021, 05/17/2022   Pneumococcal Conjugate-13 10/21/2015   Pneumococcal Polysaccharide-23 07/12/2008   Tdap 08/16/2011, 11/14/2022   Zoster Recombinant(Shingrix) 11/14/2022, 01/19/2023   Zoster, Live 07/16/2009   Pertinent  Health Maintenance Due  Topic Date Due   Influenza Vaccine  Completed   Bone Density Scan  Completed      10/18/2023    9:07 AM 01/24/2024    9:05 AM 01/24/2024    9:37 AM 06/18/2024   11:32 AM 08/06/2024   12:49 PM  Fall Risk   Falls in the past year? 0 1 0 0 0  Was there an injury with Fall? 0  1  1  0 0  Fall Risk Category Calculator 0 2 1 0 0  Patient at Risk for Falls Due to No Fall Risks History of fall(s)  No Fall Risks No Fall Risks  Fall risk Follow up Falls evaluation completed Falls evaluation completed  Falls evaluation completed Falls evaluation completed     Data saved with a previous flowsheet row definition   Functional Status Survey:    Vitals:   08/06/24 0849  BP: 122/78  Pulse: 69  Temp: 98 F (36.7 C)  SpO2: 99%  Weight: 130 lb 3.2 oz (59.1 kg)  Height: 5' 3 (1.6 m)   Body mass index is 23.06 kg/m. Physical Exam Vitals and nursing note reviewed.  Constitutional:      Appearance: Normal appearance.  HENT:     Head: Normocephalic and atraumatic.     Nose: Nose normal.     Mouth/Throat:     Mouth: Mucous membranes are moist.  Eyes:     Conjunctiva/sclera: Conjunctivae normal.     Pupils: Pupils are equal, round, and reactive to light.     Comments: dry eyes  Cardiovascular:     Rate and Rhythm: Regular rhythm. Bradycardia present.     Heart sounds: No murmur heard.    Comments: HR 50s Pulmonary:     Effort: Pulmonary effort is normal.     Breath sounds: No rales.  Abdominal:     General: Bowel sounds are normal.     Palpations: Abdomen is soft.     Tenderness: There is no abdominal tenderness.  Genitourinary:    Comments: Hemorrhoids external. Breast exam  Musculoskeletal:     Cervical back: Normal range of motion and neck supple.     Right lower leg: No edema.     Left lower leg: No edema.  Skin:    General: Skin is warm and dry.     Comments: Dry skin  Neurological:     General: No focal deficit present.     Mental Status: She is alert and oriented to person, place, and time. Mental status is at baseline.     Gait: Gait normal.  Psychiatric:        Mood and Affect: Mood normal.        Behavior: Behavior normal.        Thought Content: Thought content  normal.        Judgment: Judgment normal.     Labs reviewed: Recent Labs    09/17/23 0707 05/12/24 0729  NA 139 141  K 4.6 4.3  CL 103 104  CO2 29 26  GLUCOSE 92 99  BUN 12 17  CREATININE 0.93 1.27*  CALCIUM 9.2 9.1   Recent Labs    09/17/23 0707 05/12/24 0729  AST 22 21  ALT 15 16  BILITOT 0.5 0.8  PROT 7.0 7.1   Recent Labs    09/17/23 0707 05/12/24 0729  WBC 5.7 4.7  NEUTROABS 2,725 2,312  HGB 15.2 15.2  HCT 46.4* 45.7*  MCV 96.7 96.2  PLT 291 282   Lab Results  Component Value Date   TSH 3.90 05/12/2024   Lab Results  Component Value Date   HGBA1C 5.8 (H) 10/16/2022   Lab Results  Component Value Date   CHOL 172 10/16/2022   HDL 56 10/16/2022   LDLCALC 89 10/16/2022   TRIG 171 (H) 10/16/2022   CHOLHDL 3.1 10/16/2022    Significant Diagnostic Results in last 30 days:  No results found.  Assessment/Plan  Paroxysmal SVT (supraventricular tachycardia) DOE, HR in 50, followed by Cardiology, 08/13/22 Echo EF 60-65  GERD (gastroesophageal reflux disease) stable, off Esomeprazole , Hgb 15.2 05/12/24  Osteoporosis no recent fx. 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   Hypothyroidism takes  Levothyroxine, TSH 3.9 05/12/24  MCI (mild cognitive impairment) with memory loss Mild memory loss, saw Neurology, stopped taking Memantine , caused itching, now resumed.    Family/ staff Communication: plan of care reviewed with the patient and charge nurse.   Labs/tests ordered:  CBC/diff, CMP/eGFR, Vit B12, Vit D, lipids, TSH/free T4, Hgb a1c        [1]  Allergies Allergen Reactions   Penicillin G  Benzathine & Proc Other (See Comments)    Unknown.   "

## 2024-08-06 NOTE — Assessment & Plan Note (Signed)
"   Bun/creat 17/1.27 05/12/24 "

## 2024-08-06 NOTE — Assessment & Plan Note (Signed)
 no recent fx. on Ca Vit D, and Fosamax , Vit D 42 05/12/24

## 2024-08-06 NOTE — Assessment & Plan Note (Signed)
 taking Wellbutrin  now, failed Sertraline , Lorazepam .

## 2024-08-06 NOTE — Patient Instructions (Addendum)
 1.) Visit your local pharmacy if you would like to receive covid boosters.  2.) Follow-up in clinic 6 months, labs prior.  3.) Fasting labs on Tuesday 02/02/25 @ 7 am at the [facility]

## 2024-08-06 NOTE — Assessment & Plan Note (Signed)
DOE, HR in 50, followed by Cardiology, 08/13/22 Echo EF 60-65

## 2024-08-19 ENCOUNTER — Other Ambulatory Visit: Payer: Self-pay | Admitting: Sports Medicine

## 2025-02-04 ENCOUNTER — Encounter: Admitting: Nurse Practitioner
